# Patient Record
Sex: Male | Born: 1960 | Race: White | Hispanic: No | Marital: Married | Smoking: Current every day smoker
Health system: Southern US, Community
[De-identification: ages and names within clinical notes are randomized; demographics above are authoritative.]

## PROBLEM LIST (undated history)

## (undated) DIAGNOSIS — M542 Cervicalgia: Secondary | ICD-10-CM

## (undated) DIAGNOSIS — J189 Pneumonia, unspecified organism: Secondary | ICD-10-CM

## (undated) DIAGNOSIS — R519 Headache, unspecified: Secondary | ICD-10-CM

## (undated) DIAGNOSIS — I10 Essential (primary) hypertension: Secondary | ICD-10-CM

## (undated) DIAGNOSIS — M1990 Unspecified osteoarthritis, unspecified site: Secondary | ICD-10-CM

## (undated) DIAGNOSIS — M9111 Juvenile osteochondrosis of head of femur [Legg-Calve-Perthes], right leg: Secondary | ICD-10-CM

## (undated) DIAGNOSIS — F32A Depression, unspecified: Secondary | ICD-10-CM

## (undated) DIAGNOSIS — W19XXXA Unspecified fall, initial encounter: Secondary | ICD-10-CM

## (undated) DIAGNOSIS — F419 Anxiety disorder, unspecified: Secondary | ICD-10-CM

## (undated) DIAGNOSIS — M549 Dorsalgia, unspecified: Secondary | ICD-10-CM

## (undated) DIAGNOSIS — F329 Major depressive disorder, single episode, unspecified: Secondary | ICD-10-CM

## (undated) DIAGNOSIS — F410 Panic disorder [episodic paroxysmal anxiety] without agoraphobia: Secondary | ICD-10-CM

## (undated) HISTORY — PX: HERNIA REPAIR: SHX51

## (undated) HISTORY — PX: JOINT REPLACEMENT: SHX530

## (undated) HISTORY — PX: ORCHIECTOMY: SHX2116

---

## 1998-03-31 ENCOUNTER — Inpatient Hospital Stay (HOSPITAL_COMMUNITY): Admission: AD | Admit: 1998-03-31 | Discharge: 1998-04-06 | Payer: Self-pay

## 1998-04-10 ENCOUNTER — Encounter (HOSPITAL_COMMUNITY): Admission: RE | Admit: 1998-04-10 | Discharge: 1998-07-09 | Payer: Self-pay

## 1998-04-14 ENCOUNTER — Inpatient Hospital Stay (HOSPITAL_COMMUNITY): Admission: AD | Admit: 1998-04-14 | Discharge: 1998-04-19 | Payer: Self-pay | Admitting: *Deleted

## 1998-04-20 ENCOUNTER — Encounter (HOSPITAL_COMMUNITY): Admission: RE | Admit: 1998-04-20 | Discharge: 1998-07-19 | Payer: Self-pay

## 1998-12-16 HISTORY — PX: ORCHIECTOMY: SHX2116

## 1998-12-16 HISTORY — PX: INGUINAL HERNIA REPAIR: SUR1180

## 1999-01-31 ENCOUNTER — Encounter: Admission: RE | Admit: 1999-01-31 | Discharge: 1999-04-26 | Payer: Self-pay | Admitting: Anesthesiology

## 1999-04-26 ENCOUNTER — Encounter: Admission: RE | Admit: 1999-04-26 | Discharge: 1999-07-05 | Payer: Self-pay | Admitting: Anesthesiology

## 1999-04-27 ENCOUNTER — Ambulatory Visit (HOSPITAL_COMMUNITY): Admission: RE | Admit: 1999-04-27 | Discharge: 1999-04-27 | Payer: Self-pay | Admitting: *Deleted

## 1999-07-05 ENCOUNTER — Encounter: Admission: RE | Admit: 1999-07-05 | Discharge: 1999-10-03 | Payer: Self-pay | Admitting: Anesthesiology

## 2001-02-17 ENCOUNTER — Ambulatory Visit (HOSPITAL_BASED_OUTPATIENT_CLINIC_OR_DEPARTMENT_OTHER): Admission: RE | Admit: 2001-02-17 | Discharge: 2001-02-17 | Payer: Self-pay | Admitting: Orthopedic Surgery

## 2001-06-15 ENCOUNTER — Emergency Department (HOSPITAL_COMMUNITY): Admission: EM | Admit: 2001-06-15 | Discharge: 2001-06-15 | Payer: Self-pay | Admitting: Emergency Medicine

## 2001-12-02 ENCOUNTER — Emergency Department (HOSPITAL_COMMUNITY): Admission: EM | Admit: 2001-12-02 | Discharge: 2001-12-02 | Payer: Self-pay | Admitting: *Deleted

## 2003-01-25 ENCOUNTER — Encounter: Admission: RE | Admit: 2003-01-25 | Discharge: 2003-01-25 | Payer: Self-pay | Admitting: *Deleted

## 2003-05-06 ENCOUNTER — Encounter: Admission: RE | Admit: 2003-05-06 | Discharge: 2003-05-06 | Payer: Self-pay | Admitting: Occupational Medicine

## 2003-05-06 ENCOUNTER — Encounter: Payer: Self-pay | Admitting: Occupational Medicine

## 2003-08-03 ENCOUNTER — Ambulatory Visit (HOSPITAL_BASED_OUTPATIENT_CLINIC_OR_DEPARTMENT_OTHER): Admission: RE | Admit: 2003-08-03 | Discharge: 2003-08-03 | Payer: Self-pay | Admitting: Orthopedic Surgery

## 2004-01-31 ENCOUNTER — Encounter
Admission: RE | Admit: 2004-01-31 | Discharge: 2004-04-30 | Payer: Self-pay | Admitting: Physical Medicine & Rehabilitation

## 2005-09-10 ENCOUNTER — Emergency Department (HOSPITAL_COMMUNITY): Admission: EM | Admit: 2005-09-10 | Discharge: 2005-09-10 | Payer: Self-pay | Admitting: Emergency Medicine

## 2006-05-09 ENCOUNTER — Emergency Department (HOSPITAL_COMMUNITY): Admission: EM | Admit: 2006-05-09 | Discharge: 2006-05-09 | Payer: Self-pay | Admitting: Emergency Medicine

## 2006-05-17 ENCOUNTER — Emergency Department (HOSPITAL_COMMUNITY): Admission: EM | Admit: 2006-05-17 | Discharge: 2006-05-17 | Payer: Self-pay | Admitting: Emergency Medicine

## 2006-07-31 ENCOUNTER — Emergency Department (HOSPITAL_COMMUNITY): Admission: EM | Admit: 2006-07-31 | Discharge: 2006-07-31 | Payer: Self-pay | Admitting: Emergency Medicine

## 2006-11-24 ENCOUNTER — Encounter: Admission: RE | Admit: 2006-11-24 | Discharge: 2006-11-24 | Payer: Self-pay | Admitting: Orthopaedic Surgery

## 2006-12-01 ENCOUNTER — Encounter: Admission: RE | Admit: 2006-12-01 | Discharge: 2006-12-01 | Payer: Self-pay | Admitting: Orthopaedic Surgery

## 2007-07-17 ENCOUNTER — Inpatient Hospital Stay (HOSPITAL_COMMUNITY): Admission: AD | Admit: 2007-07-17 | Discharge: 2007-07-21 | Payer: Self-pay | Admitting: Psychiatry

## 2007-07-17 ENCOUNTER — Ambulatory Visit: Payer: Self-pay | Admitting: Psychiatry

## 2008-10-05 ENCOUNTER — Emergency Department (HOSPITAL_COMMUNITY): Admission: EM | Admit: 2008-10-05 | Discharge: 2008-10-05 | Payer: Self-pay | Admitting: Family Medicine

## 2008-11-24 ENCOUNTER — Emergency Department (HOSPITAL_COMMUNITY): Admission: EM | Admit: 2008-11-24 | Discharge: 2008-11-24 | Payer: Self-pay | Admitting: Emergency Medicine

## 2009-07-07 ENCOUNTER — Emergency Department (HOSPITAL_COMMUNITY): Admission: EM | Admit: 2009-07-07 | Discharge: 2009-07-07 | Payer: Self-pay | Admitting: Emergency Medicine

## 2009-07-14 ENCOUNTER — Emergency Department (HOSPITAL_COMMUNITY): Admission: EM | Admit: 2009-07-14 | Discharge: 2009-07-14 | Payer: Self-pay | Admitting: Emergency Medicine

## 2010-07-23 ENCOUNTER — Encounter: Admission: RE | Admit: 2010-07-23 | Discharge: 2010-07-23 | Payer: Self-pay | Admitting: Internal Medicine

## 2011-01-06 ENCOUNTER — Encounter: Payer: Self-pay | Admitting: Orthopaedic Surgery

## 2011-03-24 LAB — COMPREHENSIVE METABOLIC PANEL
ALT: 15 U/L (ref 0–53)
Albumin: 3.8 g/dL (ref 3.5–5.2)
Alkaline Phosphatase: 51 U/L (ref 39–117)
BUN: 8 mg/dL (ref 6–23)
Chloride: 105 mEq/L (ref 96–112)
GFR calc Af Amer: >60 mL/min (ref >60)
Total Protein: 6.4 g/dL (ref 6.0–8.3)

## 2011-03-24 LAB — CBC
HCT: 39.2 % (ref 39.0–52.0)
Platelets: 295 10*3/uL (ref 150–400)
RBC: 4.23 MIL/uL (ref 4.22–5.81)

## 2011-05-03 NOTE — Assessment & Plan Note (Signed)
MEDICAL RECORD NUMBER:  [id number].   Joseph Curtis is back regarding his neck and shoulder pain. He had left shoulder  surgery performed with repair of his rotator cuff and some other excisions.  I do not have details of the surgery. We increased his MS Contin to 30 mg  q.12h. and really since the surgery he has had severe pain regardless of  this. He is also complaining of pain in the mid right back area. He is  tolerating the MS Contin fairly well. He is using Skelaxin 800 mg q.6h.  p.r.n. for breakthrough symptoms with some relief. Sleep is a problem at  times although pain is usually worse with activity. Pain is rated as a 8/10  on average.   REVIEW OF SYSTEMS:  The patient denies any chest pain, shortness of breath,  cold or flu symptoms. He denies weakness, numbness, spasms, confusion. He  has had some anxiety and depression. He is currently on Wellbutrin 200 mg  q.d. He has had periodic headaches and nausea as well as problems with  appetite, weight loss, and swelling.   PHYSICAL EXAMINATION:  Blood pressure 161/103, pulse 76. He is saturating  92% on room air. He has some elevation of the right shoulder noted at rest  today and to a certain extent. Certainly, there is hyperactivity of the  lower latissimus dorsi muscle around the T7-T8 level. He had focal bands of  tissue that reproduced pain with palpation today. Pain was worsened also  with shoulder extension and flexion as well as rotation of the spine and  flexion of the spine to either side. Right and left shoulder revealed  surgical site which is clean and intact with one remaining suture. The  patient has fair affect although there was a bit of distress in general  during the examination today.   ASSESSMENT:  1. Status post left rotator cuff tear, 726.11.  2. Cervicalgia related to #1, 723.1.  3. Degenerative right knee disease, 713.96.  4. Myofascial pain in the right latissimus dorsi muscle, 726.9.   PLAN:  1. After  informed consent, we injected the right latissimus dorsi in two     areas today with 2 cc of lidocaine. The patient tolerated this well and     had some relief.  2. I would like physical therapy to work on posture and myofascial     treatments to the right side as able in the context of his left shoulder     rehab.  3. Will continue with his MS Contin at 30 mg q.12h. as well as the Vicodin     5/500 for breakthrough pain. Will use his Skelaxin 800 mg q.6h. for     spasms.  4. Will see the patient back in about a month's time.      Joseph Curtis, M.D.   ZTS/MedQ  D:  04/25/2004 12:54:51  T:  04/25/2004 15:20:36  Job #:  [id number]

## 2011-05-03 NOTE — Assessment & Plan Note (Signed)
HISTORY:  The patient is here regarding his multiple areas of pain,  including his left shoulder and neck.  We had attempted to get some records  of his recent CT and MRI of his cervical spine, but the patient was unable  to obtain these for [place].  We initiated some Topamax for possible neuropathic  pain, and he was unable to tolerate this, secondary to a rash.  His Skelaxin  has been helpful to some extent.  He was using Lortab periodically for break-  through pain.  He has not received any surgical opinion regarding his left  shoulder at this point.  Right now he is using Naproxen daily as well as  Tylenol for break-through severe pain.  The patient rates his pain overall  at a 6-8/10.  It is worse with any type of work or activity.  The pain is  generally worse also at night time.  He denies any frank numbness in the  left arm, but does have some burning-type sensations.   REVIEW OF SYSTEMS:  The patient denies any chest pain, shortness of breath  or problems with his heart.  He has had occasional weakness and spasms in  the left shoulder.  No numbness, seizures, or confusion.  He has had some  problems with sleep and mood.  He has had periodic headaches as related to  the neck portion of his discomfort.  The patient denies nausea, vomiting,  reflux, or constipation.  He has had some weight loss.   PHYSICAL EXAMINATION:  GENERAL:  The patient is generally pleasant, and in  no acute distress.  VITAL SIGNS:  Blood pressure 154/111, pulse 90, respirations 16.  NEUROLOGIC:  He walks with a slight limp to the left side, but generally is  alert and oriented and with a normal appearance.  He had minimal change from  his prior examination, with the strength generally at 5/5 on the right and  3+ to 5/5 on the left.  Reflexes 2+ and sensory examination was grossly  intact.  Spurling's test was negative.  He had positive impingement signs  and apprehensive signs in the left shoulder today.  Motor  in the lower  extremities was 5/5 with intact reflexes and sensation.  No focal muscle  spasm is noted in the low back or the cervical regions today.  He had pain  along the occipital ridge and cervical/suboccipital musculature.   ASSESSMENT:  1. Left partial rotator cuff tear.  2. Cervicalgia on the right.  This may be due to occipital neuralgia, versus     myofascial pain and degenerative joint disease.  3. Degenerative right knee disease.   PLAN:  1. We still need to obtain the patient's recent CT and MRI, which he plans     to provide for [place] shortly.  2. Secondary to his ongoing significant pain, we will start him on a     fentanyl patch 25 mcg q.72h.  Also will provide him with Ultram 50 mg     q.6h. p.r.n. break-through pain.  He may continue with the Skelaxin     p.r.n. spasms at 800 mg.  3. The patient has stopped his Lortab.  4. Await surgical opinion regarding his left shoulder.  5. Consider future treatments, pending his orthopedic outcome and     radiographical findings.  6. I will see the patient back in approximately one month's time.      Ranelle Oyster, M.D.   ZTS/MedQ  D:  02/20/2004  14:55:47  T:  02/20/2004 16:35:45  Job #:  [id number]   cc:   Reuben Likes, M.D.  [address]  [place]  [place] [address]  Fax: [phone]
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
## 2011-05-03 NOTE — Op Note (Signed)
NAME:  Joseph Curtis, Joseph Curtis                         ACCOUNT NO.:  [id number]   MEDICAL RECORD NO.:  [id number]                   PATIENT TYPE:  AMB   LOCATION:  DSC                                  FACILITY:  [facility]   PHYSICIAN:  Harvie Junior, M.D.                DATE OF BIRTH:  June 30, 1961   DATE OF PROCEDURE:  08/03/2003  DATE OF DISCHARGE:                                 OPERATIVE REPORT   PREOPERATIVE DIAGNOSES:  1. Impingement.  2. Acromioclavicular joint arthritis.  3. Concern over partial thickness rotator cuff tear.   POSTOPERATIVE DIAGNOSES:  1. Impingement.  2. Acromioclavicular joint arthritis.  3. Concern over partial thickness rotator cuff tear.  4. Posterior labral tear.   PROCEDURES:  1. Anterolateral acromioplasty.  2. Distal clavicle resection, arthroscopic from an anterior portal.  3. Debridement of a posterior labral tear from an anterior portal within the     glenohumeral joint.   SURGEON:  Harvie Junior, M.D.   ASSISTANT:  Currie Paris. Orma Flaming, P.A.-C   ANESTHESIA:  General.   BRIEF HISTORY:  This is a 50 year old male with a long history of having had  significant problems with pain in his left shoulder.  He ultimately was  evaluated with a MRI which showed that he had some impingement with partial  thickness rotator cuff tearing.  After prolonged conservative care and  failure of the patient to improve completely, the patient was ultimately  taken to the operating room for subacromial decompression, distal clavicle  resection, and evaluation as needed.   DESCRIPTION OF PROCEDURE:  The patient was brought to the operating room  and, after adequate anesthesia was obtained with general anesthetic, the  patient was placed on the operating table.  The left arm was prepped and  draped in the usual sterile fashion.   Following this, routine arthroscopic examination of the shoulder revealed  there was an obvious rotator cuff tear on the undersurface.  This  was  debrided from the glenohumeral joint.  The posterior labrum was identified  and noted also to be torn.  The biceps tendon was anchored, although loose,  could not be pulled down into the joint.  The glenohumeral joint was without  evidence of abnormality.   At this point, the cannula was removed from the glenohumeral joint and  attention was turned up into the subacromial space.  An anterolateral  acromioplasty was performed.  Distal clavicle resection over 17 mm and the  rotator cuff was evaluated superiorly, especially in the area of concern of  partial thickness tearing undersurface.  The rotator cuff had no full  thickness tearing from the superior surface.  A radical bursectomy was  performed at this point.   The shoulder was copiously irrigated and suctioned dry.  The portals were  closed with a bandage.  A sterile compression dressing was applied.   The patient was taken to recovery  and was noted to be in satisfactory  condition.   ESTIMATED BLOOD LOSS:  None.                                               Harvie Junior, M.D.    Ranae Plumber  D:  08/03/2003  T:  08/04/2003  Job:  [id number]

## 2011-05-03 NOTE — Group Therapy Note (Signed)
REASON FOR EVALUATION:  Mr. Lott is here with complaints of left shoulder,  right neck and right knee pain.   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  He had multiple  surgeries and eventually had a patellectomy.  He has pain with walking for  prolonged periods of time and with any type of activity in the right knee.  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  He has seen Dr. Harvie Junior for management of this.  He tells me  he has had 7 injections in the shoulder and also to the left [facility] joint.  He  has also been diagnosed, by his account, with occipital neuralgia and has  seen Dr. Marlan Palau in the past for this, as well as a prior pain  center.  He tells me he has had occipital nerve blocks in the past.  These  helped to a certain extent.  He has been on Naprosyn for 4 years for the  headaches and this pain and states that this generally helps but he does not  want to be on this long-term.  He is using Lortab 10s, two to three a day,  depending on his level of pain, using Cymbalta 60 mg daily for mood as well  as Depakote 500 mg t.i.d. for mood stabilization and Xanax p.r.n., 1 mg  daily to b.i.d.   The patient rates his pain on an average of 5/10, but will range from 4-  8/10, depending on his level of activity.  He denies any back pain, weakness  or numbness in the extremities.  He has had multiple tests done in the past  but does not recall when he has had a recent CT or MRI of his neck.  He has  had a recent shoulder scan with contrast.  He is seeking a second opinion  regarding his left shoulder.   The patient reports that the pain improves with heat and medications,  worsens with any type  of work or activity.   PAST MEDICAL HISTORY:  1. The patient reports a history of arthritis.  2. Apparently anxiety disorder as well with depression.  3. Multiple knee operations of the right knee.  4. Triple inguinal hernias and surgery to correct this and eventual     orchiectomy on the left.  5. He had a left shoulder arthroscopy by Dr. Luiz Blare.  6. The patient had a history of undescended testicles.  7. History of Legg-Perthes disease.  8. History of post-traumatic stress disorder.     Dr. Cleone Slim is his psychiatrist.   CURRENT MEDICATIONS:  Current medications are listed above in the HPI.   ALLERGIES:  Allergies are to PENICILLIN and CODEINE.   SOCIAL HISTORY:  The patient is married with children.  He smokes a half a  pack per day.  He has not worked since May 06, 2003.   FAMILY HISTORY:  Family history is noncontributory.   REVIEW OF SYSTEMS:  The patient denies chest pain, shortness of breath,  wheezing, coughing and cold symptoms.  He has occasional weakness and pain  in the left arm with use.  He also reports anxiety,  depression and sleep of  4 to 6 hours a night.  He also has frequent headaches that are worsened with  activity and can be worse at night.  He has some vomiting when his pain is  bad.  He reports at 15-pound weight loss over the last 6 to 8 months.  He  denies any other symptomatology at this point.   PHYSICAL EXAMINATION:  GENERAL:  On physical examination today, the patient  is pleasant, in no acute distress.  VITAL SIGNS:  Blood pressure is 150/109.  Pulse is 91, respiratory rate 16.  NEUROMUSCULAR:  He walks with a normal gait.  He is generally alert and  normal appearance.  His neck was painful to palpation along the cervical  paraspinals and the capitis splenius and spinalis muscles.  He had pain  along the occipital ridge as well.  Pain was worse with palpation.  It did  not necessarily appear worse with particular movements, although twisting to   the left, it seemed to increase the pain a bit.  There was no focal muscle  spasm noted today.  Range of motion was generally fair in the neck  The  patient has normal passive range of motion of both shoulders but had  weakness with abduction on the left side.  He had pain with impingement  maneuvers, had pain over the Lifescape joint as well.  Bicipital tendons were  nontender.  Strength in the right arm was 5/5; the left arm was 3/5  proximally to 5/5 distally.  Reflexes were 2+.  Sensory exam was intact.  The patient had a negative Spurling's test.  Low back exam was normal with  normal range of motion and appearance today.  Right knee had postoperative  scarring with absent patella.  The patient had some surprisingly good  strength with knee extension and flexion at 5/5 and there was minimal pain  with palpation today.  It appears that he had some grooves made in his  distal femur to help track the quadriceps tendon.  Motor exam generally  throughout the lower extremities was 5/5 with normal sensory function.  He  had normal range of motion and reflexes were 2+.  The patient cognitively  was appropriate today.   ASSESSMENT:  1. Left partial rotator cuff tear.  2. Cervicalgia on the right.  This is consistent with occipital neuralgia     but could not rule out myofascial component and osteoarthritic component.  3. Degenerative right knee disease.   PLAN:  1. I would like to get more of the patient's records, as I have very limited     information at this point.  I would like to see a recent CT or MRI of his     cervical spine.  2. For proposed neuropathic pain, we will begin Topamax 25 mg daily,     titrating up to 75 mg daily over the next 2 weeks' time.  3. For any muscle spasm component, we will try to schedule and then     eventually p.r.n. Skelaxin at 4 times daily, 800 mg.  4. The patient can continue with his Lortab for now. 5. The patient needs surgical intervention apparently at  the left shoulder,     as he obviously has some weakness and discomfort, and I am not sure what     we can add from a therapy standpoint until his pain is less.  6. Will consider long-acting opioid at some point, depending on progress  with the current regimen.  7. Consider future nerve blocks in the neck, depending on progress.     Ranelle Oyster, M.D.   ZTS/MedQ  D:  02/01/2004 09:58:43  T:  02/01/2004 12:18:08  Job #:  [id number]   cc:   Reuben Likes, M.D.  [address]  [place]  [place] [address]  Fax: [phone]

## 2011-05-03 NOTE — Assessment & Plan Note (Signed)
Joseph Curtis is back regarding his left neck and shoulder pain. He has what turns  out to be a left full thickness tear of the supraspinatus tendon apparently.  He has seen surgery for a second opinion and apparently will have surgery to  repair the tendon in the coming weeks. We started him on Duragesic for pain  control, and he was unable to tolerate this secondary to nausea and  vomiting. We switched him to MS Contin 15 mg q.12h., and he found that this  was not overly helpful. Today, he rates his pain at 7/10. The patient starts  in the shoulder and radiates into the neck and left arm. Pain is worse with  any type of lifting and activity.   REVIEW OF SYSTEMS:  The patient denies any chest pain, shortness of breath,  wheezing, coughing, cold symptoms. He denies any weakness, numbness,  dizziness, spasms, strokes, or confusion. He has had no anxiety, depression,  sleep, or mood changes. Nausea and vomiting are listed above. Denies  diarrhea or constipation.   PHYSICAL EXAMINATION:  On physical exam today, blood pressure 143/88, pulse  83, saturating 96% on room air. The patient walks with normal gait. Affect  is appropriate. Continues to have significant weakness in the left shoulder.  There was some moderate spasm of the left trapezium muscle and pain with  rightward lateral bending and rotation. Neck was moderately tender. Reflexes  were 2+. Left shoulder was positive for pain with impingement and  apprehension signs. The patient had minimal initial abduction of the  shoulder.   ASSESSMENT/PLAN:  1. Left rotator cuff tear.  2. Cervicalgia probably related to #1.  3. Degenerative right knee disease.   PLAN:  1. The patient will follow up with surgery for rotator cuff repair.  2. For now, we will increase his MS Contin at 30 mg q.12h. I gave him     Vicodin 5/500 for breakthrough pain #45, use on a q.12h. p.r.n. basis.  3. I will see the patient back in approximately five weeks'  time.      Ranelle Oyster, M.D.   ZTS/MedQ  D:  03/21/2004 10:55:08  T:  03/21/2004 11:47:21  Job #:  [id number]

## 2011-09-11 ENCOUNTER — Other Ambulatory Visit: Payer: Self-pay | Admitting: Family Medicine

## 2011-09-11 DIAGNOSIS — M25551 Pain in right hip: Secondary | ICD-10-CM

## 2011-09-14 ENCOUNTER — Other Ambulatory Visit: Payer: Self-pay

## 2011-09-17 ENCOUNTER — Ambulatory Visit
Admission: RE | Admit: 2011-09-17 | Discharge: 2011-09-17 | Disposition: A | Discharge status: Home / Self Care | Payer: Self-pay | Source: Ambulatory Visit | Attending: Family Medicine | Admitting: Family Medicine

## 2011-09-17 DIAGNOSIS — M25552 Pain in left hip: Secondary | ICD-10-CM

## 2011-09-30 LAB — COMPREHENSIVE METABOLIC PANEL
Alkaline Phosphatase: 55
BUN: 28 — ABNORMAL HIGH
Calcium: 9.6
Chloride: 103
Creatinine, Ser: 0.85
GFR calc non Af Amer: >60
Glucose, Bld: 91
Potassium: 4.3
Sodium: 139
Total Bilirubin: 0.6
Total Protein: 7.1

## 2011-09-30 LAB — DRUGS OF ABUSE SCREEN W/O ALC, ROUTINE URINE
Amphetamine Screen, Ur: NEGATIVE
Benzodiazepines.: POSITIVE — AB
Creatinine,U: 172.5
Marijuana Metabolite: NEGATIVE

## 2011-09-30 LAB — CBC
HCT: 44.3
Platelets: 277

## 2011-09-30 LAB — TSH: TSH: 1.189

## 2011-09-30 LAB — BENZODIAZEPINE, QUANTITATIVE, URINE: Alprazolam (GC/LC/MS), ur confirm: 250 ng/mL

## 2011-09-30 LAB — VALPROIC ACID LEVEL: Valproic Acid Lvl: 61.4

## 2013-12-16 ENCOUNTER — Emergency Department (HOSPITAL_COMMUNITY): Payer: Medicare Other

## 2013-12-16 ENCOUNTER — Emergency Department (HOSPITAL_COMMUNITY)
Admission: EM | Admit: 2013-12-16 | Discharge: 2013-12-16 | Disposition: A | Discharge status: Home / Self Care | Payer: Medicare Other | Attending: Emergency Medicine | Admitting: Emergency Medicine

## 2013-12-16 ENCOUNTER — Encounter (HOSPITAL_COMMUNITY): Payer: Self-pay | Admitting: Emergency Medicine

## 2013-12-16 DIAGNOSIS — S0990XA Unspecified injury of head, initial encounter: Secondary | ICD-10-CM | POA: Insufficient documentation

## 2013-12-16 DIAGNOSIS — Z79899 Other long term (current) drug therapy: Secondary | ICD-10-CM | POA: Insufficient documentation

## 2013-12-16 DIAGNOSIS — S199XXA Unspecified injury of neck, initial encounter: Secondary | ICD-10-CM

## 2013-12-16 DIAGNOSIS — F17200 Nicotine dependence, unspecified, uncomplicated: Secondary | ICD-10-CM | POA: Insufficient documentation

## 2013-12-16 DIAGNOSIS — S0180XA Unspecified open wound of other part of head, initial encounter: Secondary | ICD-10-CM | POA: Insufficient documentation

## 2013-12-16 DIAGNOSIS — M545 Low back pain, unspecified: Secondary | ICD-10-CM | POA: Insufficient documentation

## 2013-12-16 DIAGNOSIS — Y929 Unspecified place or not applicable: Secondary | ICD-10-CM | POA: Insufficient documentation

## 2013-12-16 DIAGNOSIS — S0993XA Unspecified injury of face, initial encounter: Secondary | ICD-10-CM | POA: Insufficient documentation

## 2013-12-16 DIAGNOSIS — W1809XA Striking against other object with subsequent fall, initial encounter: Secondary | ICD-10-CM | POA: Insufficient documentation

## 2013-12-16 DIAGNOSIS — F3289 Other specified depressive episodes: Secondary | ICD-10-CM | POA: Insufficient documentation

## 2013-12-16 DIAGNOSIS — S0181XA Laceration without foreign body of other part of head, initial encounter: Secondary | ICD-10-CM

## 2013-12-16 DIAGNOSIS — I10 Essential (primary) hypertension: Secondary | ICD-10-CM | POA: Insufficient documentation

## 2013-12-16 DIAGNOSIS — G8929 Other chronic pain: Secondary | ICD-10-CM | POA: Insufficient documentation

## 2013-12-16 DIAGNOSIS — Y939 Activity, unspecified: Secondary | ICD-10-CM | POA: Insufficient documentation

## 2013-12-16 DIAGNOSIS — F410 Panic disorder [episodic paroxysmal anxiety] without agoraphobia: Secondary | ICD-10-CM | POA: Insufficient documentation

## 2013-12-16 DIAGNOSIS — R42 Dizziness and giddiness: Secondary | ICD-10-CM | POA: Insufficient documentation

## 2013-12-16 DIAGNOSIS — F329 Major depressive disorder, single episode, unspecified: Secondary | ICD-10-CM | POA: Insufficient documentation

## 2013-12-16 DIAGNOSIS — Z880 Allergy status to penicillin: Secondary | ICD-10-CM | POA: Insufficient documentation

## 2013-12-16 HISTORY — DX: Dorsalgia, unspecified: M54.9

## 2013-12-16 HISTORY — DX: Panic disorder (episodic paroxysmal anxiety): F41.0

## 2013-12-16 HISTORY — DX: Major depressive disorder, single episode, unspecified: F32.9

## 2013-12-16 HISTORY — DX: Anxiety disorder, unspecified: F41.9

## 2013-12-16 HISTORY — DX: Cervicalgia: M54.2

## 2013-12-16 HISTORY — DX: Unspecified fall, initial encounter: W19.XXXA

## 2013-12-16 HISTORY — DX: Essential (primary) hypertension: I10

## 2013-12-16 HISTORY — DX: Depression, unspecified: F32.A

## 2013-12-16 MED ORDER — OXYCODONE-ACETAMINOPHEN 5-325 MG PO TABS
1.0000 | ORAL_TABLET | Freq: Once | ORAL | Status: AC
  Administered 2013-12-16: 1 via ORAL
  Filled 2013-12-16: qty 1

## 2013-12-16 NOTE — ED Provider Notes (Signed)
CSN: [id number]     Arrival date & time 12/16/13  0744 History   First MD Initiated Contact with Patient 12/16/13 [phone]     Chief Complaint  Patient presents with  . Fall  . Facial Laceration   (Consider location/radiation/quality/duration/timing/severity/associated sxs/prior Treatment) HPI Comments: Patient with history of chronic pain presents with complaint of head injury, neck pain, facial laceration. Patient states that he stood up quickly from a recliner this morning while having a panic attack. Patient states that he blacked out and fell striking his head on a coffee table. He was unconscious for an unknown amount of time. He sustained a laceration above his right eyebrow. Patient states that he will often feel lightheaded and passed out during panic attacks. EMS was called and patient was placed in a c-collar. No other treatments prior to arrival. Patient complains of neck pain. He also has his usual chronic lower back pain. No weakness, numbness, or tingling in his arms or his legs. No nausea or vomiting. No vision changes. No chest pain or shortness of breath. Onset of symptoms acute. Course is constant. Nothing makes symptoms better or worse.  The history is provided by the patient.    Past Medical History  Diagnosis Date  . Anxiety   . Depression   . Panic attacks   . Hypertension   . 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 Neck pain     chronic  . Back pain     chronic   History reviewed. No pertinent past surgical history. No family history on file. History  Substance Use Topics  . Smoking status: Current Every Day Smoker  . Smokeless tobacco: Never Used  . Alcohol Use: No    Review of Systems  Constitutional: Negative for fatigue.  HENT: Negative for tinnitus.   Eyes: Negative for photophobia, pain and visual disturbance.  Respiratory: Negative for shortness of breath.   Cardiovascular: Negative for chest pain, palpitations and leg swelling.  Gastrointestinal:  Negative for nausea and vomiting.  Musculoskeletal: Positive for back pain and neck pain. Negative for gait problem.  Skin: Positive for wound.  Neurological: Positive for light-headedness. Negative for dizziness, weakness, numbness and headaches.  Psychiatric/Behavioral: Negative for confusion and decreased concentration.    Allergies  Bee venom; Codeine; and Penicillins  Home Medications   Current Outpatient Rx  Name  Route  Sig  Dispense  Refill  . ALPRAZolam (XANAX) 1 MG tablet   Oral   Take 1 mg by mouth 3 (three) times daily as needed for anxiety.         . bisoprolol-hydrochlorothiazide (ZIAC) 2.5-6.25 MG per tablet   Oral   Take 1 tablet by mouth every morning.         . escitalopram (LEXAPRO) 20 MG tablet   Oral   Take 20 mg by mouth every morning.         Marland Kitchen HYDROcodone-acetaminophen (NORCO) 10-325 MG per tablet   Oral   Take 1 tablet by mouth 3 (three) times daily as needed for moderate pain.         Marland Kitchen ibuprofen (ADVIL,MOTRIN) 200 MG tablet   Oral   Take 800 mg by mouth every 6 (six) hours as needed.         Marland Kitchen losartan (COZAAR) 50 MG tablet   Oral   Take 50 mg by mouth every morning.         . Melatonin 10 MG CAPS   Oral  Take 10 mg by mouth at bedtime.         Marland Kitchen OLANZapine (ZYPREXA) 5 MG tablet   Oral   Take 5 mg by mouth every morning.         . propranolol (INDERAL) 40 MG tablet   Oral   Take 40 mg by mouth 2 (two) times daily.         . rizatriptan (MAXALT) 10 MG tablet   Oral   Take 10 mg by mouth daily as needed for migraine. May repeat in 2 hours if needed          BP 146/97  Pulse 65  Temp(Src) 97.7 F (36.5 C) (Oral)  Resp 20  Ht 5\' 6"  (1.676 m)  Wt 168 lb (76.204 kg)  BMI 27.13 kg/m2  SpO2 99% Physical Exam  Nursing note and vitals reviewed. Constitutional: He is oriented to person, place, and time. He appears well-developed and well-nourished.  HENT:  Head: Normocephalic and atraumatic. Head is without  raccoon's eyes and without Battle's sign.  Right Ear: Tympanic membrane, external ear and ear canal normal. No hemotympanum.  Left Ear: Tympanic membrane, external ear and ear canal normal. No hemotympanum.  Nose: Nose normal. No nasal septal hematoma.  Mouth/Throat: Oropharynx is clear and moist.  3cm, linear, clean, hemostatic laceration full thickness of skin superior to R eyebrow, not involving eyebrow. No FB seen. Muscle visualized but not lacerated. Full ROM of forehead.   Eyes: Conjunctivae, EOM and lids are normal. Pupils are equal, round, and reactive to light.  No visible hyphema. Pupils dilated, reactive.  Neck: Normal range of motion. Neck supple.  Cardiovascular: Normal rate and regular rhythm.   Pulmonary/Chest: Effort normal and breath sounds normal.  Abdominal: Soft. There is no tenderness.  Musculoskeletal: Normal range of motion.       Cervical back: He exhibits normal range of motion, no tenderness and no bony tenderness.       Thoracic back: He exhibits no tenderness and no bony tenderness.       Lumbar back: He exhibits no tenderness and no bony tenderness.  Neurological: He is alert and oriented to person, place, and time. He has normal strength and normal reflexes. No cranial nerve deficit or sensory deficit. Coordination normal. GCS eye subscore is 4. GCS verbal subscore is 5. GCS motor subscore is 6.  Skin: Skin is warm and dry.  Psychiatric: He has a normal mood and affect.    ED Course  Procedures (including critical care time) Labs Review Labs Reviewed - No data to display Imaging Review Ct Head Wo Contrast  12/16/2013   CLINICAL DATA:  Patient experienced ectatic and femoral  EXAM: CT HEAD WITHOUT CONTRAST  CT CERVICAL SPINE WITHOUT CONTRAST  TECHNIQUE: Multidetector CT imaging of the head and cervical spine was performed following the standard protocol without intravenous contrast. Multiplanar CT image reconstructions of the cervical spine were also generated.   COMPARISON:  None.  FINDINGS: CT HEAD FINDINGS  There is no evidence of mass effect, midline shift or extra-axial fluid collections. There is no evidence of a space-occupying lesion or intracranial hemorrhage. There is no evidence of a cortical-based area of acute infarction.  The ventricles and sulci are appropriate for the patient's age. The basal cisterns are patent.  Visualized portions of the orbits are unremarkable. Mild left maxillary sinus mucosal thickening. The mastoid air cells are normal.  The osseous structures are unremarkable. There is a right frontal scalp contusion.  CT CERVICAL  SPINE FINDINGS  The alignment is anatomic. The vertebral body heights are maintained. There is no acute fracture. There is no static listhesis. The prevertebral soft tissues are normal. The intraspinal soft tissues are not fully imaged on this examination due to poor soft tissue contrast, but there is no gross soft tissue abnormality.  There is degenerative disc disease at C4-5 with a broad-based disc osteophyte complex and bilateral facet arthropathy. There is left facet arthropathy at C5-6 and C6-7.  The visualized portions of the lung apices demonstrate no focal abnormality.  IMPRESSION: 1. No acute intracranial pathology. 2. No acute osseous injury cervical spine. 3. Cervical spine spondylosis as described above.   Electronically Signed   By: Elige KoHetal  Patel   On: 12/16/2013 09:01   Ct Cervical Spine Wo Contrast  12/16/2013   CLINICAL DATA:  Patient experienced ectatic and femoral  EXAM: CT HEAD WITHOUT CONTRAST  CT CERVICAL SPINE WITHOUT CONTRAST  TECHNIQUE: Multidetector CT imaging of the head and cervical spine was performed following the standard protocol without intravenous contrast. Multiplanar CT image reconstructions of the cervical spine were also generated.  COMPARISON:  None.  FINDINGS: CT HEAD FINDINGS  There is no evidence of mass effect, midline shift or extra-axial fluid collections. There is no evidence of  a space-occupying lesion or intracranial hemorrhage. There is no evidence of a cortical-based area of acute infarction.  The ventricles and sulci are appropriate for the patient's age. The basal cisterns are patent.  Visualized portions of the orbits are unremarkable. Mild left maxillary sinus mucosal thickening. The mastoid air cells are normal.  The osseous structures are unremarkable. There is a right frontal scalp contusion.  CT CERVICAL SPINE FINDINGS  The alignment is anatomic. The vertebral body heights are maintained. There is no acute fracture. There is no static listhesis. The prevertebral soft tissues are normal. The intraspinal soft tissues are not fully imaged on this examination due to poor soft tissue contrast, but there is no gross soft tissue abnormality.  There is degenerative disc disease at C4-5 with a broad-based disc osteophyte complex and bilateral facet arthropathy. There is left facet arthropathy at C5-6 and C6-7.  The visualized portions of the lung apices demonstrate no focal abnormality.  IMPRESSION: 1. No acute intracranial pathology. 2. No acute osseous injury cervical spine. 3. Cervical spine spondylosis as described above.   Electronically Signed   By: Elige KoHetal  Patel   On: 12/16/2013 09:01    EKG Interpretation    Date/Time:  Thursday December 16 2013 08:25:31 EST Ventricular Rate:  65 PR Interval:  159 QRS Duration: 83 QT Interval:  424 QTC Calculation: 441 R Axis:   73 Text Interpretation:  Sinus rhythm Normal ECG No significant change since last tracing Confirmed by Leiam  MD, CHARLES ([id number]) on 12/16/2013 8:29:47 AM           8:16 AM Patient seen and examined. Work-up initiated. Medications ordered. D/w Dr. Bernette MayersSheldon.   Vital signs reviewed and are as follows: Filed Vitals:   12/16/13 0755  BP: 146/97  Pulse: 65  Temp: 97.7 F (36.5 C)  Resp: 20   10:02 AM Pt informed of CT results. C-collar removed. Patient has normal ROM in neck with minimal pain.    LACERATION REPAIR Performed by: Carolee RotaGEIPLE,Priyansh Pry S Authorized by: Carolee RotaGEIPLE,Dorothye Berni S Consent: Verbal consent obtained. Risks and benefits: risks, benefits and alternatives were discussed Consent given by: patient Patient identity confirmed: provided demographic data Prepped and Draped in normal sterile fashion Wound explored  Laceration Location: R forehead  Laceration Length: 3cm  No Foreign Bodies seen or palpated  Anesthesia: local infiltration  Local anesthetic: lidocaine 2% with epinephrine  Anesthetic total: 4ml  Irrigation method: skin scrub with dermal cleanser Amount of cleaning: standard  Skin closure: 5-0 Ethilon  Number of sutures: 5  Technique: simple interrupted  Patient tolerance: Patient tolerated the procedure well with no immediate complications.  10:03 AM Patient counseled on wound care. Patient counseled on need to return or see PCP/urgent care for suture removal in 5 days. Patient was urged to return to the Emergency Department urgently with worsening pain, swelling, expanding erythema especially if it streaks away from the affected area, fever, or if they have any other concerns. Patient verbalized understanding.   Patient and wife (via telephone) were counseled on head injury precautions and symptoms that should indicate their return to the ED.  These include severe worsening headache, vision changes, confusion, loss of consciousness, trouble walking, nausea & vomiting, or weakness/tingling in extremities.    Pt has chronic pain medications at home.    MDM   1. Head injury, initial encounter   2. Facial laceration, initial encounter    Head injury - CT neg. Normal neuro exam.   Fall - related to panic attack, EKG normal, no CP/palps, no orthostasis. Do not suspect serious cardiogenic etiology.   Laceration - repaired without complication per above    Renne Crigler, PA-C 12/16/13 1006

## 2013-12-16 NOTE — ED Notes (Signed)
[facility] pt was in the process of standing up during a panic attack and fell hitting head on his coffee table. Unknown LOC. Has a laceration just above his right eyebrow. Presents with c-collar in [facility]. Possible shingles to his left leg as well.

## 2013-12-16 NOTE — ED Notes (Signed)
Came into Patient room to do orthostatic vital signs.  Patient is still at CT.

## 2013-12-16 NOTE — Discharge Instructions (Signed)
Please read and follow all provided instructions.  Your diagnoses today include:  1. Head injury, initial encounter   2. Facial laceration, initial encounter     Tests performed today include:  CT scan of your head and neck that did not show any serious injury.  Vital signs. See below for your results today.   Medications prescribed:   None   Take any prescribed medications only as directed.  Home care instructions:  Follow any educational materials contained in this packet.  Do not take any medications containing aspirin for one week as this can interfere with your body's ability to clot.   BE VERY CAREFUL not to take multiple medicines containing Tylenol (also called acetaminophen). Doing so can lead to an overdose which can damage your liver and cause liver failure and possibly death.   Follow-up instructions: Please follow-up with your primary care provider in 5 days for further evaluation of your symptoms.  Return instructions:  SEEK IMMEDIATE MEDICAL ATTENTION IF:  There is confusion or drowsiness (although children frequently become drowsy after injury).   You cannot awaken the injured person.   You have more than one episode of vomiting.   You notice dizziness or unsteadiness which is getting worse, or inability to walk.   You have convulsions or unconsciousness.   You experience severe, persistent headaches not relieved by Tylenol.  You cannot use arms or legs normally.   There are changes in pupil sizes. (This is the black center in the colored part of the eye)   There is clear or bloody discharge from the nose or ears.   You have change in speech, vision, swallowing, or understanding.   Localized weakness, numbness, tingling, or change in bowel or bladder control.  You have any other emergent concerns.   Additional Information: You have had a head injury which does not appear to require admission at this time.  Your vital signs today were: BP  131/82   Pulse 72   Temp(Src) 97.7 F (36.5 C) (Oral)   Resp 20   Ht 5\' 6"  (1.676 m)   Wt 168 lb (76.204 kg)   BMI 27.13 kg/m2   SpO2 99% If your blood pressure (BP) was elevated above 135/85 this visit, please have this repeated by your doctor within one month. --------------  Emergency Department Resource Guide 1) Find a Doctor and Pay Out of Pocket Although you won't have to find out who is covered by your insurance plan, it is a good idea to ask around and get recommendations. You will then need to call the office and see if the doctor you have chosen will accept you as a new patient and what types of options they offer for patients who are self-pay. Some doctors offer discounts or will set up payment plans for their patients who do not have insurance, but you will need to ask so you aren't surprised when you get to your appointment.  2) Contact Your Local Health Department Not all health departments have doctors that can see patients for sick visits, but many do, so it is worth a call to see if yours does. If you don't know where your local health department is, you can check in your phone book. The CDC also has a tool to help you locate your state's health department, and many state websites also have listings of all of their local health departments.  3) Find a Walk-in Clinic If your illness is not likely to be very severe or  complicated, you may want to try a walk in clinic. These are popping up all over the country in pharmacies, drugstores, and shopping centers. They're usually staffed by nurse practitioners or physician assistants that have been trained to treat common illnesses and complaints. They're usually fairly quick and inexpensive. However, if you have serious medical issues or chronic medical problems, these are probably not your best option.  No Primary Care Doctor: - Call Health Connect at  [phone] - they can help you locate a primary care doctor that  accepts your insurance,  provides certain services, etc. - Physician Referral Service- [phone]  Chronic Pain Problems: Organization         Address  Phone   Notes  [facility]  [phone] Patients need to be referred by their primary care doctor.   Medication Assistance: Organization         Address  Phone   Notes  [facility] Medication [facility] [address] [place], [place] [address] [phone] --Must be a resident of [facility] -- Must have NO insurance coverage whatsoever (no Medicaid/ Medicare, etc.) -- The pt. MUST have a primary care doctor that directs their care regularly and follows them in the community   [facility]ist  [phone]   [employer]  [phone]    Agencies that provide inexpensive medical care: Organization         Address  Phone   Notes  [place]  [phone]   [facility]    [phone]   [facility] [place], [place] [address] [phone]   [place] [address] [place] [phone]   Planned Parenthood    [phone]   [facility]    [phone]   [place] and [address], [place] Phone:  [phone], Fax:  [phone] Hours of Operation:  9 am - 6 pm, M-F.  Also accepts Medicaid/Medicare and self-pay.  [facility] for [place], [address], [place] Phone: [phone], Fax: [phone]. Hours of Operation:  8:30 am - 5:30 pm, M-F.  Also accepts Medicaid and self-pay.  [facility] [address] [place] Phone: [phone]   [place], [place], [place] [phone], Ext. [phone] Mondays & Thursdays: 7-9 AM.  First 15 patients are seen on a first come, first serve basis.    [place] Providers:  Organization         Address  Phone    Notes  [facility] [address], [place] [phone] Also accepts self-pay patients.  [facility] [id number] [place], [place]  [phone]   [place], [address], [place] [phone]   [facility] [address] [place] [phone]   Lucianne Lei [address], [place]   [phone] Only accepts [place] Access [place] patients after they have their name applied to their card.   Self-Pay (no insurance) in [facility]:  Organization         Address  Phone   Notes  Sickle Cell Patients, [facility] [place] [phone]   [facility] [address]  [address], [place] [phone]   [facility] [place]  [place], [place],  [phone]   [facility]/Dr. Osei-Bonsu  [address] [place] or [address], [place] [phone] Phone number for both [place] and [place] locations is the same.  [facility] [address], [place] [phone]   [facility] [address] [place] or [address] [phone]   [facility] [address], [place] [phone], phone; [phone], fax Sees patients 1st and 3rd Saturday of every month.  Must not qualify for public or private insurance (i.e. Medicaid, Medicare, [place] Health Choice, Veterans' Benefits)  Household income should be no more than 200% of the poverty level The clinic cannot treat you if you are pregnant or think you are pregnant  Sexually transmitted diseases are not treated at the clinic.    Dental Care: Organization         Address  Phone  Notes  [facility] Department of [facility] [place] [phone] Accepts children up to age 26 who are enrolled in [place] or [place]; pregnant women with a Medicaid card; and children who have applied for Medicaid or [place] Health Choice, but were declined, whose parents can pay a reduced fee at time of service.  [facility] Department of [facility]  [address], [place] [phone] Accepts children up to age 67 who are enrolled in [place] or [place]; pregnant women with a Medicaid card; and children who have applied for Medicaid or [place] Health Choice, but were declined, whose parents can pay a reduced fee at time of service.  [place] Adult Dental Access PROGRAM  [place] [phone] Patients are seen by appointment only. Walk-ins are not accepted. [place] will see patients 36 years of age and older. Monday - Tuesday (8am-5pm) Most Wednesdays (8:30-5pm) $30 per visit, cash only  [facility] Adult Dental Access PROGRAM  [address], [facility] [phone] Patients are seen by appointment only. Walk-ins are not accepted. [place] will see patients 60 years of age and older. One Wednesday Evening (Monthly: Volunteer Based).  $30 per visit, cash only  [place]  [phone] for adults; Children under age 5, call Graduate Pediatric Dentistry at [phone]. Children aged 12-14, please call [phone] to request a pediatric application.  Dental services are provided in all areas of dental care including fillings, crowns and bridges, complete and partial dentures, implants, gum treatment, root canals, and extractions. Preventive care is also provided. Treatment is provided to both adults and children. Patients are selected via a lottery and there is often a waiting list.   [facility] [address], [place]  [phone] [email or web address]   [facility] [address] [place], [place] [phone], Ext.  [phone] Second and Fourth Thursday of each month, opens at 6:30 AM; Clinic ends at 9 AM.  Patients are seen on a first-come first-served basis, and a limited number are seen during each clinic.   [facility]  [address] Hillard Danker [place], [place] [phone]   Eligibility Requirements You must have lived in [place], [place], or [place] counties for at least the last three months.   You cannot be  eligible for state or federal sponsored [employer], including [employer], [place], or [employer].   You generally cannot be eligible for healthcare insurance through your employer.    How to apply: Eligibility screenings are held every Tuesday and Wednesday afternoon from 1:00 pm until 4:00 pm. You do not need an appointment for the interview!  [facility] [address] [place], [place]   [place]  [place] Department  [place]  [phone]    Behavioral Health Resources in the Community: Intensive Outpatient Programs Organization         Address  Phone  Notes  [place]. [address] [place], [place] [phone]   [facility] Outpatient [address] [place], [place]   ADS: Alcohol & Drug S[facility] [address] [place], [place]   [place] [address]  [place], [place] or [phone]   Substance Abuse Resources Organization         Address  Phone  Notes  Alcohol and Drug Services  [phone]   [place]  [phone]   [facility] [place]   Chinita Pester  [phone]   Residential & Outpatient Substance Abuse Program  [phone]   Psychological Services Organization         Address  Phone  Notes  [facility]  [place]  [phone]   [place] [address], [place] or [phone]    Mobile Crisis Teams Organization         Address  Phone  Notes  [facility], Mobile Crisis Care Unit  [phone]   [facility]  [address] [place], [place]   [place] Levels [address], [place] [phone]    Self-Help/Support Groups Organization         Address  Phone             Notes  [place]. of [place] - variety of support groups  [place] Call for more information  Narcotics Anonymous (NA), [facility] [address], [employer] [place]  2 meetings at this location   Special educational needs teacher         Address  Phone  Notes  [facility] [place],    [place]  [phone]   [facility]  [address], [place] [id number], [place], [place]   [place], [place] [phone] Admissions: 8am-3pm M-F  Incentives Substance [place] [address],    [place], [place] [phone]   [facility] [address] [place], [place], [place]   [facility] [address],  [place], [place]   [facility] Programs - Intensive Outpatient [address], [place], [place]   [facility] ([place].) [place].,  [place], [place] [phone] or [phone]   [facility] Services (RTS) [address] [place], [place] Accepts Medicaid  [facility] [place] [address]  [place] [phone] Substance Abuse/Addiction Treatment   [facility] [address] [facility] Organization         Address  Phone  Notes  [facility]  [phone]   Domenic Schwab, PhD [place], [address],  [place]   [phone] or [phone]) [phone]   [place]  [place], [place] [phone]   [place], [place], [place] [phone] Insurance/Medicaid/sponsorship through [facility] and Families [address], [address] [place], [place] [phone] [place], [place] [phone]    Dr. Adele Schilder  [phone]   [facility] Dept. 1) [address] [place] 2) [place] 3)  [place] [address], [place] [phone]  [phone]   [place] [phone] or [phone] (After Hours)

## 2013-12-16 NOTE — ED Provider Notes (Signed)
Medical screening examination/treatment/procedure(s) were performed by non-physician practitioner and as supervising physician I was immediately available for consultation/collaboration.  EKG Interpretation    Date/Time:  Thursday December 16 2013 08:25:31 EST Ventricular Rate:  65 PR Interval:  159 QRS Duration: 83 QT Interval:  424 QTC Calculation: 441 R Axis:   73 Text Interpretation:  Sinus rhythm Normal ECG No significant change since last tracing Confirmed by Maximiano  MD, CHARLES ([id number]) on 12/16/2013 8:29:47 AM              Leonette Mostharles B. Bernette MayersSheldon, MD 12/16/13 1015

## 2014-11-30 ENCOUNTER — Telehealth: Payer: Self-pay

## 2014-11-30 ENCOUNTER — Ambulatory Visit (INDEPENDENT_AMBULATORY_CARE_PROVIDER_SITE_OTHER): Payer: Medicare Other | Admitting: Family Medicine

## 2014-11-30 VITALS — BP 100/60 | HR 90 | Resp 20

## 2014-11-30 DIAGNOSIS — T7840XA Allergy, unspecified, initial encounter: Secondary | ICD-10-CM

## 2014-11-30 DIAGNOSIS — G894 Chronic pain syndrome: Secondary | ICD-10-CM

## 2014-11-30 DIAGNOSIS — M9110 Juvenile osteochondrosis of head of femur [Legg-Calve-Perthes], unspecified leg: Secondary | ICD-10-CM | POA: Insufficient documentation

## 2014-11-30 DIAGNOSIS — N508 Other specified disorders of male genital organs: Secondary | ICD-10-CM

## 2014-11-30 DIAGNOSIS — N5089 Other specified disorders of the male genital organs: Secondary | ICD-10-CM

## 2014-11-30 DIAGNOSIS — F410 Panic disorder [episodic paroxysmal anxiety] without agoraphobia: Secondary | ICD-10-CM | POA: Insufficient documentation

## 2014-11-30 MED ORDER — PREDNISONE 20 MG PO TABS: ORAL_TABLET | ORAL | Status: DC

## 2014-11-30 MED ORDER — DIPHENHYDRAMINE HCL 50 MG/ML IJ SOLN
25.0000 mg | Freq: Once | INTRAMUSCULAR | Status: AC
  Administered 2014-11-30: 25 mg via INTRAMUSCULAR

## 2014-11-30 MED ORDER — RANITIDINE HCL 150 MG PO TABS
150.0000 mg | ORAL_TABLET | Freq: Once | ORAL | Status: AC
Start: 2014-11-30 — End: 2014-11-30
  Administered 2014-11-30: 150 mg via ORAL

## 2014-11-30 MED ORDER — METHYLPREDNISOLONE SODIUM SUCC 125 MG IJ SOLR
125.0000 mg | Freq: Once | INTRAMUSCULAR | Status: AC
  Administered 2014-11-30: 125 mg via INTRAMUSCULAR

## 2014-11-30 MED ORDER — CETIRIZINE HCL 10 MG PO TABS
5.0000 mg | ORAL_TABLET | Freq: Once | ORAL | Status: AC
  Administered 2014-11-30: 5 mg via ORAL

## 2014-11-30 NOTE — Patient Instructions (Addendum)
We are going to use prednisone for your swelling.  You can also continue to use benadryl if needed but be cautious of sedation! If you have swelling such that you have pain or cannot pass urine please seek care right away

## 2014-11-30 NOTE — Progress Notes (Signed)
Urgent [facility] and [facility] [address] [place] [address] [id number]  Date:  11/30/2014   Name:  Joseph Curtis   DOB:  Mar 16, 1961   MRN:  [id number]  PCP:  No PCP Per Patient    Chief Complaint: Allergic Reaction   History of Present Illness:  Joseph Curtis is a 53 y.o. very pleasant male patient who presents with the following:  Here today as a new patient.  Brought back emergently as an "allergic reaction."  He seems sedated and his wife gives most of the history.  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  He was seen at his PCP office earlier today [facility]"- I am not familiar with this facility).  He seemed to be having an allergic reaction and "they told us to come straight to urgent care, don't go to the ER."  He had the appt today to get his chronic hydrocodone and alprazolam refilled.   This am early they noted that his genitals seemed to have "blisters," and then "it changed 5 different ways" according to his wife. He had "blisters, then knots, then redness" in the groin, and now swelling of the foreskin.  In addition he complains that 'my back feels like it's breaking, my head weighs 200 lbs."  He has complaint of generalized pain and malaise.  No fever noted  He has never had this issue in the past. They took 2 benadryl at home prior to coming in, about 3 hours ago  They also want to discuss his chronic pains in his back, hips; wonder if I have anything for pain to give him now.  However explaiend that I do not have any narcotic medications in clinic. 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  They have an epi- pen at  home.  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  "first he swells and then his heart will stop."   His wife gives a lengthly and tangential account of his health issues over many years as well as her own and other family members' health problems.   She reports that he has a history of "depression and PTSD" but no history of mania or bipolar disorder that they know of. She reports that he has been able to use prednisone in the past without adverse effects.   There are no active problems to display for this patient.   Past Medical History  Diagnosis Date  . Anxiety   . Depression   . Panic attacks   . Hypertension   . 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 Neck pain     chronic  . Back pain     chronic    No past surgical history on file.  History  Substance Use Topics  . Smoking status: Current Every Day Smoker  . Smokeless tobacco: Never Used  . Alcohol Use: No    No family history on file.  Allergies  Allergen Reactions  . Bee Venom Anaphylaxis  . Codeine Other (See Comments)    "My feet itch, burn--I can't stand  it."  . Penicillins     Childhood allergy    Medication list has been reviewed and updated.  Current Outpatient Prescriptions on File Prior to Visit  Medication Sig Dispense Refill  . ALPRAZolam (XANAX) 1 MG tablet Take 1 mg by mouth 3 (three) times daily as needed for anxiety.    Marland Kitchen. escitalopram (LEXAPRO) 20 MG tablet Take 20 mg by mouth every morning.    [place]. HYDROcodone-acetaminophen (NORCO) 10-325 MG per tablet Take 1 tablet by mouth 4 (four) times daily.     Marland Kitchen. losartan (COZAAR) 50 MG tablet Take 100 mg by mouth every morning.     . propranolol (INDERAL) 40 MG tablet Take 80 mg by mouth daily.     . rizatriptan (MAXALT) 10 MG tablet Take 10 mg by mouth daily as needed for migraine. May repeat in 2 hours if needed    . bisoprolol-hydrochlorothiazide (ZIAC) 2.5-6.25 MG per tablet Take 1 tablet by mouth every morning.    Marland Kitchen. ibuprofen (ADVIL,MOTRIN)  200 MG tablet Take 800 mg by mouth every 6 (six) hours as needed.    . Melatonin 10 MG CAPS Take 10 mg by mouth at bedtime.    Mar[place] Kitchen. OLANZapine (ZYPREXA) 5 MG tablet Take 5 mg by mouth every morning.     No current facility-administered medications on file prior to visit.    Review of Systems:  As per HPI- otherwise negative.   Physical Examination: Filed Vitals:   11/30/14 1114  BP: 137/65  Pulse: 98  Resp: 20   There were no vitals filed for this visit. There is no weight on file to calculate BMI. Ideal Body Weight:    GEN: WDWN, NAD, Non-toxic, responsive to questions and exam but seems sleepy HEENT: Atraumatic, Normocephalic. Neck supple. No masses, No LAD.  Bilateral TM wnl, oropharynx normal.  PEERL,EOMI.   Ears and Nose: No external deformity. CV: RRR, No M/G/R. No JVD. No thrill. No extra heart sounds. PULM: CTA B, no wheezes, crackles, rhonchi. No retractions. No resp. distress. No accessory muscle use. ABD: S, NT, ND, +BS. No rebound. No HSM. EXTR: No c/c/e NEURO Normal gait.  PSYCH: Normally interactive. Conversant. Not depressed or anxious appearing.  Calm demeanor.  GU: he has edema of the foreskin, more on the left side.  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    Given 25 mg IM benadryl, zyrtec and zantac once in clinic for swelling, also IM solumedrol  Pt then seemed to be extremely sleepy, not able to stay awake for exam/ conversation or sit up.  He was able to name the month but not the day of the week.  Noted to be hypotensive.  His wife admits he took "2 or 3" of his pain pills this morning, in addition to his chronic alprazolam and the benadryl.  She wishes to take him home but I was able to convince her that he is not ready to leave clinic.    Placed IV left AC and gave 1 liter of NS.  He rested and slept in clinc for about 90 minutes more.  Then woke up, was alert, ate crackers and a banana.  Looking much better, was alert and able to  ambulate to the bathroom on his own.  He was able to urinate    Edema of foreskin improved.  No evidence or oral swelling Assessment and Plan: Genital edema, male - Plan: predniSONE (DELTASONE) 20 MG tablet  Allergic reaction, initial encounter -  Plan: diphenhydrAMINE (BENADRYL) injection 25 mg, ranitidine (ZANTAC) tablet 150 mg, cetirizine (ZYRTEC) tablet 5 mg, methylPREDNISolone sodium succinate (SOLU-MEDROL) 125 mg/2 mL injection 125 mg  Chronic pain syndrome  Legg-Calve-Perthes disease, unspecified laterality  Panic disorder  Brought to see us urgently for an allergic reaction- this seems to have manifested as swelling of his foreskin.  Treated with IM benadryl, zantac and zyrtec as well as IM solu- medrol.  However this in combination with his pain medication (perhaps overused) caused him to become too sedated.  He was observed in clinic until these effects wore off and he was again alert.  He was able to eat and ambulate and was acting appropriately.   D/c to home with wife.  They have an epi-pen in case of any more serious allergic reaction, will use a short prednisone taper at home.  Follow-up if sx worsen or if swelling does not resolve  Meds ordered this encounter  Medications  . diphenhydrAMINE (BENADRYL) injection 25 mg    Sig:   . ranitidine (ZANTAC) tablet 150 mg    Sig:   . cetirizine (ZYRTEC) tablet 5 mg    Sig:   . risperiDONE (RISPERDAL) 0.5 MG tablet    Sig: Take 0.5 mg by mouth daily.  . methylPREDNISolone sodium succinate (SOLU-MEDROL) 125 mg/2 mL injection 125 mg    Sig:   . predniSONE (DELTASONE) 20 MG tablet    Sig: Take 2 pills a day for 3 days then 1 pill a day for 3 days    Dispense:  9 tablet    Refill:  0   See patient instructions for more details.     Signed Abbe AmsterdamJessica Copland, MD

## 2014-12-02 NOTE — Telephone Encounter (Signed)
A user error has taken place: encounter opened in error, closed for administrative reasons.

## 2015-06-05 ENCOUNTER — Encounter (HOSPITAL_COMMUNITY): Payer: Self-pay | Admitting: Emergency Medicine

## 2015-06-05 ENCOUNTER — Emergency Department (HOSPITAL_COMMUNITY)
Admission: EM | Admit: 2015-06-05 | Discharge: 2015-06-06 | Disposition: A | Discharge status: Home / Self Care | Payer: Medicare Other | Attending: Emergency Medicine | Admitting: Emergency Medicine

## 2015-06-05 DIAGNOSIS — G4700 Insomnia, unspecified: Secondary | ICD-10-CM | POA: Diagnosis not present

## 2015-06-05 DIAGNOSIS — Z9181 History of falling: Secondary | ICD-10-CM | POA: Insufficient documentation

## 2015-06-05 DIAGNOSIS — G8929 Other chronic pain: Secondary | ICD-10-CM | POA: Insufficient documentation

## 2015-06-05 DIAGNOSIS — I10 Essential (primary) hypertension: Secondary | ICD-10-CM | POA: Diagnosis not present

## 2015-06-05 DIAGNOSIS — F419 Anxiety disorder, unspecified: Secondary | ICD-10-CM | POA: Insufficient documentation

## 2015-06-05 DIAGNOSIS — G2401 Drug induced subacute dyskinesia: Secondary | ICD-10-CM | POA: Insufficient documentation

## 2015-06-05 DIAGNOSIS — R000 Tachycardia, unspecified: Secondary | ICD-10-CM | POA: Diagnosis present

## 2015-06-05 DIAGNOSIS — T50995A Adverse effect of other drugs, medicaments and biological substances, initial encounter: Secondary | ICD-10-CM | POA: Insufficient documentation

## 2015-06-05 DIAGNOSIS — Z880 Allergy status to penicillin: Secondary | ICD-10-CM | POA: Diagnosis not present

## 2015-06-05 DIAGNOSIS — Z720 Tobacco use: Secondary | ICD-10-CM | POA: Diagnosis not present

## 2015-06-05 DIAGNOSIS — F329 Major depressive disorder, single episode, unspecified: Secondary | ICD-10-CM | POA: Insufficient documentation

## 2015-06-05 DIAGNOSIS — Z79899 Other long term (current) drug therapy: Secondary | ICD-10-CM | POA: Diagnosis not present

## 2015-06-05 MED ORDER — METHYLPREDNISOLONE SODIUM SUCC 125 MG IJ SOLR
125.0000 mg | Freq: Once | INTRAMUSCULAR | Status: AC
  Administered 2015-06-06: 125 mg via INTRAVENOUS
  Filled 2015-06-05: qty 2

## 2015-06-05 MED ORDER — DIPHENHYDRAMINE HCL 50 MG/ML IJ SOLN
25.0000 mg | Freq: Once | INTRAMUSCULAR | Status: AC
  Administered 2015-06-06: 25 mg via INTRAVENOUS
  Filled 2015-06-05: qty 1

## 2015-06-05 MED ORDER — SODIUM CHLORIDE 0.9 % IV BOLUS (SEPSIS)
1000.0000 mL | Freq: Once | INTRAVENOUS | Status: AC
  Administered 2015-06-06: 1000 mL via INTRAVENOUS

## 2015-06-05 MED ORDER — FAMOTIDINE IN NACL 20-0.9 MG/50ML-% IV SOLN
20.0000 mg | Freq: Once | INTRAVENOUS | Status: AC
  Administered 2015-06-06: 20 mg via INTRAVENOUS
  Filled 2015-06-05: qty 50

## 2015-06-05 NOTE — ED Notes (Signed)
Per EMS daughter called ems to house due to patient's inability to sleep and tongue swelling. Per patient patient has been unable to sleep for past 3 days and had been chewing on his tongue and is unable to stop. Patient states chronic back pain and has been out of his anxiety medication and pain med since June 3.

## 2015-06-06 DIAGNOSIS — G2401 Drug induced subacute dyskinesia: Secondary | ICD-10-CM | POA: Diagnosis not present

## 2015-06-06 MED ORDER — BENZTROPINE MESYLATE 1 MG/ML IJ SOLN
0.5000 mg | Freq: Once | INTRAMUSCULAR | Status: AC
  Administered 2015-06-06: 0.5 mg via INTRAVENOUS
  Filled 2015-06-06: qty 2

## 2015-06-06 MED ORDER — PROPRANOLOL HCL 40 MG PO TABS: 80.0000 mg | ORAL_TABLET | Freq: Every day | ORAL | Status: DC

## 2015-06-06 MED ORDER — BENZTROPINE MESYLATE 1 MG PO TABS: 1.0000 mg | ORAL_TABLET | Freq: Two times a day (BID) | ORAL | Status: DC

## 2015-06-06 NOTE — ED Provider Notes (Signed)
Medical screening examination/treatment/procedure(s) were conducted as a shared visit with non-physician practitioner(s) and myself.  I personally evaluated the patient during the encounter.   EKG Interpretation None      Patient here with trouble sleeping as well as some tongue swelling. He has been office and a psychotic for 18 days. Denies any pruritus. On exam his tongue is not occluding his airway. He does not have any lip swelling. No rashes noted. His uvula is normal. He does have some tardive dyskinesia symptoms and I believe that this is what is causing his tongue to be slightly swollen but no signs of overt trauma. Will give Cogentin. And monitor   Lorre Nick, MD 06/06/15 0100

## 2015-06-06 NOTE — ED Provider Notes (Signed)
CSN: [id number]     Arrival date & time 06/05/15  2312 History   First MD Initiated Contact with Patient 06/05/15 2313     Chief Complaint  Patient presents with  . Insomnia  . Tachycardia     (Consider location/radiation/quality/duration/timing/severity/associated sxs/prior Treatment) HPI  Joseph Curtis is a 54 y.o. male presenting with insomnia as well as tongue swelling that started today.Per daughter pt has been chewing on his tongue is unable to stop. Patient hasn't taken anything her symptoms and he denies any alleviating factors. Patient states he's been out of his anxiety medication believes is contributing. Pt also reports difficulty sleeping for the last 3 days. He has not had his pain medications or anxiety medication since June 3. Patient denies any new medications, new foods, insect bites, tick bites, new lotions, detergents, fevers, chills, shortness, difficulty breathing, nausea, vomiting, abdominal pain. No HI, SI, hallucinations, self-harm, alcohol use. Patient denies illicit drug use. No IV drug use.   Past Medical History  Diagnosis Date  . Anxiety   . Depression   . Panic attacks   . Hypertension   . 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 Neck pain     chronic  . Back pain     chronic   History reviewed. No pertinent past surgical history. No family history on file. History  Substance Use Topics  . Smoking status: Current Every Day Smoker  . Smokeless tobacco: Never Used  . Alcohol Use: No    Review of Systems 10 Systems reviewed and are negative for acute change except as noted in the HPI.    Allergies  Bee venom; Codeine; and Penicillins  Home Medications   Prior to Admission medications   Medication Sig Start Date End Date Taking? Authorizing Provider  ALPRAZolam Prudy Feeler) 1 MG tablet Take 1 mg by mouth 3 (three) times daily as needed for anxiety.    Historical Provider, MD  benztropine (COGENTIN) 1 MG tablet Take 1 tablet (1 mg total) by mouth 2  (two) times daily. 06/06/15   Oswaldo Conroy, PA-C  bisoprolol-hydrochlorothiazide [facility]) 2.5-6.25 MG per tablet Take 1 tablet by mouth every morning.    Historical Provider, MD  escitalopram (LEXAPRO) 20 MG tablet Take 20 mg by mouth every morning.    Historical Provider, MD  HYDROcodone-acetaminophen (NORCO) 10-325 MG per tablet Take 1 tablet by mouth 4 (four) times daily.     Historical Provider, MD  ibuprofen (ADVIL,MOTRIN) 200 MG tablet Take 800 mg by mouth every 6 (six) hours as needed.    Historical Provider, MD  losartan (COZAAR) 50 MG tablet Take 100 mg by mouth every morning.     Historical Provider, MD  Melatonin 10 MG CAPS Take 10 mg by mouth at bedtime.    Historical Provider, MD  OLANZapine (ZYPREXA) 5 MG tablet Take 5 mg by mouth every morning.    Historical Provider, MD  predniSONE (DELTASONE) 20 MG tablet Take 2 pills a day for 3 days then 1 pill a day for 3 days 11/30/14   Pearline Cables, MD  propranolol (INDERAL) 40 MG tablet Take 80 mg by mouth daily.     Historical Provider, MD  propranolol (INDERAL) 40 MG tablet Take 2 tablets (80 mg total) by mouth daily. 06/06/15   Oswaldo Conroy, PA-C  risperiDONE (RISPERDAL) 0.5 MG tablet Take 0.5 mg by mouth daily.    Historical Provider, MD  rizatriptan (MAXALT) 10 MG tablet Take 10 mg by mouth daily  as needed for migraine. May repeat in 2 hours if needed    Historical Provider, MD   BP 117/93 mmHg  Pulse 95  Temp(Src) 98.3 F (36.8 C) (Oral)  Resp 31  Wt 168 lb (76.204 kg)  SpO2 99% Physical Exam  Constitutional: He appears well-developed and well-nourished. No distress.  HENT:  Head: Normocephalic and atraumatic.  Mild tongue swelling without oropharynx involvement. No oropharynx lesions. No facial or lip swelling. Pt tolerating secretions. No uvula deviation or swelling.  Eyes: Conjunctivae and EOM are normal. Right eye exhibits no discharge. Left eye exhibits no discharge.  Cardiovascular: Normal rate and regular rhythm.    Pulmonary/Chest: Effort normal and breath sounds normal. No stridor. No respiratory distress. He has no wheezes.  Abdominal: Soft. Bowel sounds are normal. He exhibits no distension. There is no tenderness.  Neurological: He is alert. He exhibits normal muscle tone. Coordination normal.  Speech fluent and goal oriented. Patient moves all four extremities without any focal neurological deficits and has no facial droop. Patient with bilateral tremor. Normal gait.   Skin: Skin is warm and dry. He is not diaphoretic.  Nursing note and vitals reviewed.   ED Course  Procedures (including critical care time) Labs Review Labs Reviewed - No data to display  Imaging Review No results found.   EKG Interpretation None      MDM   Final diagnoses:  Tardive dyskinesia   Pt with long standing history of antipsychotic use (zyprexa and risperdal) who presents with tongue swelling. No rash, facial or lip swelling. Pt tolerating secretions. Mild tongue swelling without concern for airway occlusion. Pt with tardive dyskinesia. Pt given dose of cogentin in ED and script. Pt to continue to not take antipsychotics. Also refilled patients propanolol. Pt given referral to [facility] as well as a list of ED resources to establish care with PCP and behavioral health clinic. Patient is afebrile, nontoxic, and in no acute distress. Patient is appropriate for outpatient management and is stable for discharge.  Discussed return precautions with patient. Discussed all results and patient verbalizes understanding and agrees with plan.  This is a shared patient. This patient was discussed with the physician who saw and evaluated the patient and agrees with the plan.     Oswaldo Conroy, PA-C 06/07/15 1508  Lorre Nick, MD 06/08/15 1710

## 2015-06-06 NOTE — Discharge Instructions (Signed)
Return to the emergency room with worsening of symptoms, new symptoms or with symptoms that are concerning, especially difficulty breathing, swallowing, facial or lip swelling, drooling, rash, ich. Please call your doctor for a followup appointment within 24-48 hours. When you talk to your doctor please let them know that you were seen in the emergency department and have them acquire all of your records so that they can discuss the findings with you and formulate a treatment plan to fully care for your new and ongoing problems. If you do not have a primary care provider please call the number below under ED resources to establish care with a provider and follow up.  Read below information and follow recommendations.  Tardive Dyskinesia Tardive dyskinesia is a neurological syndrome. It is caused by the long-term use of neuroleptic drugs. Those drugs are generally prescribed for psychiatric disorders, as well as for some gastrointestinal and neurological disorders. This syndrome causes repetitive, involuntary, purposeless movements. SYMPTOMS   Grimacing.  Tongue protrusion.  Lip smacking, puckering, and pursing.  Rapid eye blinking.  Rapid movements of the arms, legs, and trunk.  Impaired movements of the fingers. It may appear as though the patient is playing an invisible guitar or piano. TREATMENT  There is no standard treatment for this syndrome. Treatment is highly individualized. The first step is generally to stop or minimize the use of the neuroleptic drug. But for patients with a severe underlying condition this may not be a feasible option. Replacing the neuroleptic drug with substitute drugs may help some patients. Other drugs may also be helpful. Examples include:  Benzodiazepines.  Adrenergic antagonists.  Dopamine agonists. Symptoms may last long after the neuroleptic drugs have been stopped. But, with careful management, some symptoms may improve and/or disappear with  time. Document Released: 11/22/2002 Document Revised: 02/24/2012 Document Reviewed: 02/04/2014 [facility] Patient Information 2015 [place], [place]. This information is not intended to replace advice given to you by your health care provider. Make sure you discuss any questions you have with your health care provider.    Emergency Department Resource Guide 1) Find a Doctor and Pay Out of Pocket Although you won't have to find out who is covered by your insurance plan, it is a good idea to ask around and get recommendations. You will then need to call the office and see if the doctor you have chosen will accept you as a new patient and what types of options they offer for patients who are self-pay. Some doctors offer discounts or will set up payment plans for their patients who do not have insurance, but you will need to ask so you aren't surprised when you get to your appointment.  2) Contact Your Local Health Department Not all health departments have doctors that can see patients for sick visits, but many do, so it is worth a call to see if yours does. If you don't know where your local health department is, you can check in your phone book. The CDC also has a tool to help you locate your state's health department, and many state websites also have listings of all of their local health departments.  3) Find a Walk-in Clinic If your illness is not likely to be very severe or complicated, you may want to try a walk in clinic. These are popping up all over the country in pharmacies, drugstores, and shopping centers. They're usually staffed by nurse practitioners or physician assistants that have been trained to treat common illnesses and complaints. They're usually  fairly quick and inexpensive. However, if you have serious medical issues or chronic medical problems, these are probably not your best option.  No Primary Care Doctor: - Call Health Connect at  [phone] - they can help you locate a primary  care doctor that  accepts your insurance, provides certain services, etc. - Physician Referral Service- [phone]  Chronic Pain Problems: Organization         Address  Phone   Notes  [facility]  [phone] Patients need to be referred by their primary care doctor.   Medication Assistance: Organization         Address  Phone   Notes  [facility] Medication [facility] [address] [place] [place], [place] [address] [phone] --Must be a resident of [facility] -- Must have NO insurance coverage whatsoever (no Medicaid/ Medicare, etc.) -- The pt. MUST have a primary care doctor that directs their care regularly and follows them in the community   MedAssist  [phone]   [employer]  [phone]    Agencies that provide inexpensive medical care: Organization         Address  Phone   Notes  [facility]  [phone]   [facility]    [phone]   [facility] [address] [place], [place] [address] [phone]   [facility] [address], [place] [phone]   Planned Parenthood    [phone]   [facility]    [phone]   [facility]  [address], [place] Phone:  [phone], Fax:  [phone] Hours of Operation:  9 am - 6 pm, M-F.  Also accepts Medicaid/Medicare and self-pay.  [facility]  [address], [place] Phone: [phone], Fax: [phone]. Hours of Operation:  8:30 am - 5:30 pm, M-F.  Also accepts Medicaid and self-pay.  [facility] [place] [address], [place] Phone: [phone]   [facility] [address] Natasha Bence [place], [place] [phone], Ext. [phone] Mondays & Thursdays: 7-9 AM.  First 15 patients are seen on a first come, first serve basis.    Medicaid-accepting [facility]  Providers:  Organization         Address  Phone   Notes  [facility] [address], [place] [phone] Also accepts self-pay patients.  [facility] [address] Laurell Josephs [place], [place]  [phone]   [facility] [address], [place] [phone]   [facility] [address], [place] [phone]   Renaye Rakers [address], [place]   [phone] Only accepts [place] Access [place] patients after they have their name applied to their card.   Self-Pay (no insurance) in [facility]:  Organization         Address  Phone   Notes  Sickle Cell Patients, [facility] [address] [place], [place] [phone]   [facility] [address] [place], [place] [phone]   [facility] [place]  [address], [place] [phone]   [facility]/Dr. Osei-Bonsu  [address] [place] or [address], [place] [phone] Phone  number for both [place] and [place] locations is the same.  Urgent Medical and [facility] [address] [place] [phone]   [facility] [address], [place] or [address] [phone]   [facility] [address], [place] [phone], phone; [phone], fax Sees patients 1st and 3rd Saturday of every month.  Must not qualify for public or private insurance (i.e. Medicaid, Medicare, [place] Health Choice, Veterans' Benefits)  Household income should be no more than 200% of the poverty level The clinic cannot treat you if you are pregnant or think you are pregnant  Sexually transmitted diseases are not treated at the clinic.    Dental Care: Organization         Address  Phone  Notes  [facility] Department of [facility] [address] [place], [place] [phone] Accepts children up to age 16 who are enrolled in [place] or [place] Health Choice; pregnant women with a Medicaid card; and children who have applied for Medicaid or [place] Health Choice, but were declined, whose parents can pay a reduced fee at time of service.  [facility] Department of [facility]  [address], [place] [phone] Accepts children up to age 65 who are enrolled in [place] or [place] Health Choice; pregnant women with a Medicaid card; and children who have applied for Medicaid or [place] Health Choice, but were declined, whose parents can pay a reduced fee at time of service.  [facility]  [address] [place], [place] [phone] Patients are seen by appointment only. Walk-ins are not accepted. [facility] will see patients 30 years of age and older. Monday - Tuesday (8am-5pm) Most Wednesdays (8:30-5pm) $30 per visit, cash only  [facility] Adult Dental Access [facility]  [address], [facility] [phone] Patients are seen by appointment only. Walk-ins are not accepted. [facility] will see patients 80 years of age and older. One Wednesday Evening (Monthly: Volunteer Based).  $30 per visit, cash only  [employer]  [phone] for adults; Children under age 59, call Graduate Pediatric Dentistry at [phone]. Children aged 85-14, please call [phone] to request a pediatric application.  Dental services are provided in all areas of dental care including fillings, crowns and bridges, complete and partial dentures, implants, gum treatment, root canals, and extractions. Preventive care is also provided. Treatment is provided to both adults and children. Patients are selected via a lottery and there is often a waiting list.   [facility] [address] [place]  [phone] [email or web address]   [facility]  [address] [place], [place] [phone], Ext. [phone] Second and Fourth Thursday of each month, opens at 6:30 AM; Clinic ends at 9 AM.  Patients are seen on a first-come first-served basis, and a limited number are seen during each clinic.   [facility]  [address] Ether Griffins [place], [place] [phone]   Eligibility Requirements You must have lived in [place], [place], or [place] counties for at least the last three months.   You cannot be eligible for state or federal sponsored [place], including [employer], [place], or [employer].   You generally cannot be eligible for healthcare insurance through your employer.    How to apply: Eligibility screenings are held every Tuesday and Wednesday afternoon from 1:00 pm  until 4:00 pm. You do not need an appointment for the interview!  [facility] [address] [place], [place] [phone]   [facility] Health Department  [phone]   [facility] Health Department  [phone]   [facility] Health Department  [phone]    Behavioral Health Resources in the Community: Intensive Outpatient Programs Organization         Address  Phone  Notes  [facility] [address] [place], [place] [phone]   [facility] Outpatient [address], [place], [place] [phone]   [facility] [address] [place], [place]  [phone]   [facility] Mental Health [address],  [place], [place] [phone] or [phone]   Substance Abuse Resources Organization         Address  Phone  Notes  [facility] Drug [facility]  [phone]   [facility]  ([phone]   [facility]  [phone]   Floydene Flock  [phone]   Residential & Outpatient Substance Abuse Program  [phone]   Psychological Services Organization         Address  Phone  Notes  [facility] Health  [phone]    [facility] Services  [phone]   [facility] Mental Health [address] [place] [phone] or [phone]    Mobile Crisis Teams Organization         Address  Phone  Notes  [facility], Mobile Crisis Care Unit  [phone]   [facility]  [address] [place], [place] [phone]   Doristine Locks [address] [place] [phone]    Self-Help/Support Groups Organization         Address  Phone             Notes  [facility] [place] - variety of support groups  336- I7437963 Call for more information  Narcotics Anonymous (NA), [facility] [address], [employer] [place]  2 meetings at this location   Statistician         Address  Phone  Notes  [facility] [address],    [place]  [phone]   [facility]  [address], [place] [address], [place], [place] [phone]   [facility] [address] [place], [place] [phone] Admissions: 8am-3pm M-F  [facility] [address],    [place], [place] [phone]   [facility] [address] [place], [place], [place] [phone]   [facility] [address]  [place], [place] [phone]   [facility] - Intensive Outpatient [address] Laurell Josephs [address], [place], [place] [phone]   [facility] (Addiction Recovery [facility] [address] [place]  [place], [place] [phone] or [phone]   [facility] ([facility]) [address] [place], [place] [phone] Accepts Medicaid  [facility] [address]  [place] [phone] Substance Abuse/Addiction Treatment   [facility] Organization         Address  Phone  Notes  [facility]  [phone]) [phone]   Angie Fava, PhD [address] Ervin Knack [place], [place]   (678)[phone] or [phone]    [facility] Behavioral   [address] [place], [place] [phone]   [facility] [address] [place], [place] [phone] Insurance/Medicaid/sponsorship through [employer] and Families [address]                                    [place], [place] [phone] Therapy/tele-psych/case  °[facility] [address]  ° [place] ([phone]) [phone]    °Dr. Arfeen  ([phone]   °[facility] [place]  [employer] [place] [facility] 1) [address],  °2) [address], [place] °3)  [address], [place] [phone]) [phone] °([phone]) [phone] ° °([phone]) [phone]   °[place] Child Abuse Hotline (336) 342-1394 or (336) [phone] (After Hours)    ° ° ° °

## 2015-06-15 ENCOUNTER — Emergency Department (HOSPITAL_COMMUNITY)
Admission: EM | Admit: 2015-06-15 | Discharge: 2015-06-17 | Disposition: A | Discharge status: Other Acute Inpatient Hospital | Payer: Medicare Other | Attending: Emergency Medicine | Admitting: Emergency Medicine

## 2015-06-15 ENCOUNTER — Emergency Department (HOSPITAL_COMMUNITY): Payer: Medicare Other

## 2015-06-15 ENCOUNTER — Encounter (HOSPITAL_COMMUNITY): Payer: Self-pay | Admitting: *Deleted

## 2015-06-15 DIAGNOSIS — G8929 Other chronic pain: Secondary | ICD-10-CM | POA: Diagnosis not present

## 2015-06-15 DIAGNOSIS — T50995A Adverse effect of other drugs, medicaments and biological substances, initial encounter: Secondary | ICD-10-CM | POA: Diagnosis not present

## 2015-06-15 DIAGNOSIS — Z9181 History of falling: Secondary | ICD-10-CM | POA: Insufficient documentation

## 2015-06-15 DIAGNOSIS — F4310 Post-traumatic stress disorder, unspecified: Secondary | ICD-10-CM | POA: Diagnosis not present

## 2015-06-15 DIAGNOSIS — Z720 Tobacco use: Secondary | ICD-10-CM | POA: Insufficient documentation

## 2015-06-15 DIAGNOSIS — D72829 Elevated white blood cell count, unspecified: Secondary | ICD-10-CM | POA: Insufficient documentation

## 2015-06-15 DIAGNOSIS — F419 Anxiety disorder, unspecified: Secondary | ICD-10-CM | POA: Diagnosis present

## 2015-06-15 DIAGNOSIS — I10 Essential (primary) hypertension: Secondary | ICD-10-CM | POA: Diagnosis not present

## 2015-06-15 DIAGNOSIS — Z880 Allergy status to penicillin: Secondary | ICD-10-CM | POA: Insufficient documentation

## 2015-06-15 DIAGNOSIS — F410 Panic disorder [episodic paroxysmal anxiety] without agoraphobia: Secondary | ICD-10-CM | POA: Diagnosis not present

## 2015-06-15 DIAGNOSIS — R4182 Altered mental status, unspecified: Secondary | ICD-10-CM

## 2015-06-15 DIAGNOSIS — F329 Major depressive disorder, single episode, unspecified: Secondary | ICD-10-CM | POA: Insufficient documentation

## 2015-06-15 DIAGNOSIS — Z79899 Other long term (current) drug therapy: Secondary | ICD-10-CM | POA: Diagnosis not present

## 2015-06-15 DIAGNOSIS — F1210 Cannabis abuse, uncomplicated: Secondary | ICD-10-CM | POA: Diagnosis not present

## 2015-06-15 DIAGNOSIS — G2401 Drug induced subacute dyskinesia: Secondary | ICD-10-CM | POA: Insufficient documentation

## 2015-06-15 DIAGNOSIS — R440 Auditory hallucinations: Secondary | ICD-10-CM | POA: Diagnosis present

## 2015-06-15 LAB — URINALYSIS, ROUTINE W REFLEX MICROSCOPIC
Glucose, UA: NEGATIVE mg/dL
Hgb urine dipstick: NEGATIVE
KETONES UR: NEGATIVE mg/dL
LEUKOCYTES UA: NEGATIVE
NITRITE: NEGATIVE
PH: 6 (ref 5.0–8.0)
PROTEIN: NEGATIVE mg/dL
Specific Gravity, Urine: 1.024 (ref 1.005–1.030)
Urobilinogen, UA: 1 mg/dL (ref 0.0–1.0)

## 2015-06-15 LAB — COMPREHENSIVE METABOLIC PANEL
ALK PHOS: 112 U/L (ref 38–126)
ALT: 75 U/L — ABNORMAL HIGH (ref 17–63)
ANION GAP: 12 (ref 5–15)
AST: 31 U/L (ref 15–41)
Albumin: 3.9 g/dL (ref 3.5–5.0)
BUN: 18 mg/dL (ref 6–20)
CHLORIDE: 103 mmol/L (ref 101–111)
CO2: 23 mmol/L (ref 22–32)
CREATININE: 1.05 mg/dL (ref 0.61–1.24)
Calcium: 9.3 mg/dL (ref 8.9–10.3)
GFR calc non Af Amer: >60 mL/min (ref >60)
Glucose, Bld: 87 mg/dL (ref 65–99)
POTASSIUM: 3.9 mmol/L (ref 3.5–5.1)
Sodium: 138 mmol/L (ref 135–145)
Total Bilirubin: 0.8 mg/dL (ref 0.3–1.2)
Total Protein: 7.6 g/dL (ref 6.5–8.1)

## 2015-06-15 LAB — CBC
HCT: 40.8 % (ref 39.0–52.0)
HEMOGLOBIN: 13.9 g/dL (ref 13.0–17.0)
MCH: 30.9 pg (ref 26.0–34.0)
MCHC: 34.1 g/dL (ref 30.0–36.0)
MCV: 90.7 fL (ref 78.0–100.0)
Platelets: 416 10*3/uL — ABNORMAL HIGH (ref 150–400)
RBC: 4.5 MIL/uL (ref 4.22–5.81)
RDW: 14 % (ref 11.5–15.5)
WBC: 19 10*3/uL — AB (ref 4.0–10.5)

## 2015-06-15 LAB — ETHANOL: Alcohol, Ethyl (B): <5 mg/dL (ref <5)

## 2015-06-15 LAB — RAPID URINE DRUG SCREEN, HOSP PERFORMED
AMPHETAMINES: NOT DETECTED
BARBITURATES: NOT DETECTED
Benzodiazepines: NOT DETECTED
Cocaine: NOT DETECTED
Opiates: NOT DETECTED
Tetrahydrocannabinol: POSITIVE — AB

## 2015-06-15 MED ORDER — ACETAMINOPHEN 325 MG PO TABS
650.0000 mg | ORAL_TABLET | ORAL | Status: DC | PRN
  Administered 2015-06-15: 650 mg via ORAL
  Filled 2015-06-15: qty 2

## 2015-06-15 MED ORDER — ESCITALOPRAM OXALATE 10 MG PO TABS
20.0000 mg | ORAL_TABLET | Freq: Every morning | ORAL | Status: DC
  Administered 2015-06-16 – 2015-06-17 (×2): 20 mg via ORAL
  Filled 2015-06-15 (×2): qty 2

## 2015-06-15 MED ORDER — STERILE WATER FOR INJECTION IJ SOLN
INTRAMUSCULAR | Status: AC
  Administered 2015-06-15: 1.2 mL
  Filled 2015-06-15: qty 10

## 2015-06-15 MED ORDER — LORAZEPAM 0.5 MG PO TABS
1.0000 mg | ORAL_TABLET | Freq: Three times a day (TID) | ORAL | Status: DC | PRN
  Administered 2015-06-16 – 2015-06-17 (×2): 1 mg via ORAL
  Filled 2015-06-15 (×2): qty 2

## 2015-06-15 MED ORDER — LORAZEPAM 2 MG/ML IJ SOLN
1.0000 mg | Freq: Once | INTRAMUSCULAR | Status: AC
  Administered 2015-06-15: 1 mg via INTRAVENOUS
  Filled 2015-06-15: qty 1

## 2015-06-15 MED ORDER — OLANZAPINE 5 MG PO TABS
5.0000 mg | ORAL_TABLET | Freq: Every morning | ORAL | Status: DC
  Administered 2015-06-16: 5 mg via ORAL
  Filled 2015-06-15: qty 1

## 2015-06-15 MED ORDER — ZIPRASIDONE MESYLATE 20 MG IM SOLR
10.0000 mg | Freq: Once | INTRAMUSCULAR | Status: AC
  Administered 2015-06-15: 10 mg via INTRAMUSCULAR
  Filled 2015-06-15: qty 20

## 2015-06-15 MED ORDER — SODIUM CHLORIDE 0.9 % IV BOLUS (SEPSIS)
1000.0000 mL | Freq: Once | INTRAVENOUS | Status: AC
  Administered 2015-06-15: 1000 mL via INTRAVENOUS

## 2015-06-15 MED ORDER — LOSARTAN POTASSIUM 50 MG PO TABS
100.0000 mg | ORAL_TABLET | Freq: Every morning | ORAL | Status: DC
  Administered 2015-06-16 – 2015-06-17 (×2): 100 mg via ORAL
  Filled 2015-06-15 (×2): qty 2

## 2015-06-15 NOTE — ED Notes (Signed)
TTS consult unsuccessful due to patient's mental state.

## 2015-06-15 NOTE — Progress Notes (Addendum)
Patient was referred for IP treatment at: [facility] - per Mickeal SkinnerPhoebe, "Fax referral, we have beds." [facility] - per Thayer Ohmhris, "Fax referral for review." [facility] - per Olegario MessierKathy, "We have some beds available". [facility] - per intake, have beds. [facility] - per Darl PikesSusan, may have bed. [facility] - per intake, "Fax referral for review." Per Fayrene FearingJames, referral under review. [facility] - per Thayer Ohmhris, male adult and geriatric beds open. [facility] - per intake, fax it.  Declined at: [facility] - per Morrie SheldonAshley, due to acuity.  At [facility]: [facility], and [facility].  CSW will continue to seek placement/follow up.  [place] Abtsatia Particia Strahm, LCSWA Disposition staff 06/15/2015 7:58 PM

## 2015-06-15 NOTE — ED Notes (Signed)
Asked for urine sample pt is unable to follow instructions to  provide one

## 2015-06-15 NOTE — ED Notes (Signed)
MD at bedside. 

## 2015-06-15 NOTE — ED Notes (Signed)
Patient transported to CT 

## 2015-06-15 NOTE — BH Assessment (Addendum)
Tele Assessment Note   Joseph Curtis is an 54 y.o. male. Pt arrived via EMS. This writer could not obtain information from the Pt due to the Pt being incoherent, having flight of ideas, and speaking to individuals who were not present. The Pt appeared to be in restraints. While trying to conduct the assessment the Pt was trying to get out of his restraints.   Collateral contact: Pt's daughter Joseph Curtis. reports that her father has a long mental health history. According to the Mrs. Joseph Curtis, her father has been diagnosed with Panic Disorder, Anxiety, and Bipolar Disorder. She states that the Pt has been prescribed Xanax for 16 years, but he has not had his medication in a month. According to Mrs. Curtis on 06/05/15 she took the Pt to the ED because of he was chewing the sides of his mouth. Mrs. Joseph Curtis states that she feels that chewing the side of his mouth was a side effect of not having his medication. Mrs. Joseph Curtis also states that the Pt had severe jerking of the muscles as well. Mrs. Joseph Curtis reports that the Pt was provided with 2 medications (she cannot recall their names). Mrs. Joseph Curtis states that the Pt then began to have hallucinations. She states that the Pt began talking about aliens and aliens putting things in teeth. According to Mrs. Curtis, the Pt began to try to extract his own teeth. Mrs. Joseph Curtis states that the Pt's hallucinations have continued to worsen. It was reported that the Pt had a traumatic childhood and it has negatively affected him. The writer attempted to contact Joseph Curtis Down the Pt's wife but was unsuccessful.  This Clinical research associatewriter consulted with Joseph CottonJosephine, NP. Per Josephine Pt will be observed overnight. Pt pending am psych evaluation.   Axis I: Anxiety Disorder NOS, Major Depression, Recurrent severe and Post Traumatic Stress Disorder Axis II: Deferred Axis III:  Past Medical History  Diagnosis Date  . Anxiety   . Depression   . Panic attacks   . Hypertension   . Fall      [place] [employer] [place]  . Neck pain     chronic  . Back pain     chronic   Axis IV: educational problems, other psychosocial or environmental problems, problems related to social environment and problems with primary support group Axis V: 41-50 serious symptoms  Past Medical History:  Past Medical History  Diagnosis Date  . Anxiety   . Depression   . Panic attacks   . Hypertension   . Fall     from [employer]  . Neck pain     chronic  . Back pain     chronic    History reviewed. No pertinent past surgical history.  Family History: History reviewed. No pertinent family history.  Social History:  reports that he has been smoking.  He has never used smokeless tobacco. He reports that he does not drink alcohol. His drug history is not on file.  Additional Social History:  Alcohol / Drug Use Pain Medications: UTA Prescriptions: UTA Over the Counter: UTA History of alcohol / drug use?: No history of alcohol / drug abuse Longest period of sobriety (when/how long): Unknown  CIWA: CIWA-Ar BP: 161/100 mmHg Pulse Rate: 69 COWS:    PATIENT STRENGTHS: (choose at least two) Average or above average intelligence Capable of independent living Communication skills  Allergies:  Allergies  Allergen Reactions  . Bee Venom Anaphylaxis  . Codeine Other (See Comments)    "My feet itch, burn--I  can't stand it."  . Penicillins     Childhood allergy    Home Medications:  (Not in a hospital admission)  OB/GYN Status:  No LMP for male patient.  General Assessment Data Location of Assessment: [facility] TTS Assessment: In system Is this a Tele or Face-to-Face Assessment?: Tele Assessment Is this an Initial Assessment or a Re-assessment for this encounter?: Initial Assessment Marital status: Married [place] name: NA Is patient pregnant?: No Pregnancy Status: No Living Arrangements: Spouse/significant other Can pt return to current living arrangement?: Yes Admission  Status: Voluntary Is patient capable of signing voluntary admission?: Yes Referral Source: Self/Family/Friend Insurance type: Medicare     Crisis Care Plan Living Arrangements: Spouse/significant other Name of Psychiatrist: Unknown Name of Therapist: Unknown  Education Status Is patient currently in school?: No (UTA) Current Grade: NA Highest grade of school patient has completed: Unknown Name of school: NA Contact person: NA  Risk to self with the past 6 months Suicidal Ideation: No (UTA) Has patient been a risk to self within the past 6 months prior to admission? : Other (comment) (UTA) Suicidal Intent: No (UTA) Has patient had any suicidal intent within the past 6 months prior to admission? : No (UTA) Is patient at risk for suicide?: No (UTA) Suicidal Plan?: No (UTA) Has patient had any suicidal plan within the past 6 months prior to admission? : Other (comment) (UTA) Access to Means: No (UTA) What has been your use of drugs/alcohol within the last 12 months?: UTA Previous Attempts/Gestures: No (UTA) How many times?: 0 (UTA) Other Self Harm Risks: UTA Triggers for Past Attempts: Other (Comment) (UTA) Intentional Self Injurious Behavior: None (UTA) Family Suicide History: Unknown Recent stressful life event(s): Other (Comment) (UTA) Persecutory voices/beliefs?: Yes (Informed EDP) Depression: No (UTA) Depression Symptoms:  (UTA) Substance abuse history and/or treatment for substance abuse?: No Suicide prevention information given to non-admitted patients: Not applicable  Risk to Others within the past 6 months Homicidal Ideation: No (UTA) Does patient have any lifetime risk of violence toward others beyond the six months prior to admission? : Unknown Thoughts of Harm to Others: No (UTA) Current Homicidal Intent: No Current Homicidal Plan: No (UTA) Access to Homicidal Means: No (UTA) Identified Victim: NA History of harm to others?: No (UTA) Assessment of Violence:  None Noted (UTA) Violent Behavior Description: Unknown (UTA) Does patient have access to weapons?: No (UTA) Criminal Charges Pending?: No (UTA) Does patient have a court date: No (UTA) Is patient on probation?: Unknown  Psychosis Hallucinations: Auditory Delusions: None noted  Mental Status Report Appearance/Hygiene: Disheveled Eye Contact: Poor Motor Activity: Freedom of movement Speech: Incoherent, Rapid Level of Consciousness: Combative, Irritable Mood: Irritable Affect: Irritable Anxiety Level: Minimal Thought Processes: Irrelevant Judgement: Impaired Orientation: Unable to assess Obsessive Compulsive Thoughts/Behaviors: Unable to Assess  Cognitive Functioning Concentration: Unable to Assess Memory: Unable to Assess IQ: Average Insight: Unable to Assess Impulse Control: Unable to Assess Appetite: Fair (UTA) Weight Loss: 0 Weight Gain: 0 Sleep: Unable to Assess Total Hours of Sleep: 5 Vegetative Symptoms: None  ADLScreening [facility] Assessment Services) Patient's cognitive ability adequate to safely complete daily activities?: Yes Patient able to express need for assistance with ADLs?: Yes Independently performs ADLs?: Yes (appropriate for developmental age)  Prior Inpatient Therapy Prior Inpatient Therapy: Yes Prior Therapy Dates: 2015 Prior Therapy Facilty/Provider(s): [facility] Reason for Treatment: psychoissi  Prior Outpatient Therapy Prior Outpatient Therapy: No Prior Therapy Dates: NA Prior Therapy Facilty/Provider(s): NA Reason for Treatment: NAn Does patient have an ACCT  team?: No Does patient have Intensive In-House Services?  : No Does patient have [facility] services? : No Does patient have P4CC services?: No  ADL Screening (condition at time of admission) Patient's cognitive ability adequate to safely complete daily activities?: Yes Is the patient deaf or have difficulty hearing?: No Does the patient have difficulty seeing, even when wearing  glasses/contacts?: No Does the patient have difficulty concentrating, remembering, or making decisions?: No Patient able to express need for assistance with ADLs?: Yes Does the patient have difficulty dressing or bathing?: No Independently performs ADLs?: Yes (appropriate for developmental age) Does the patient have difficulty walking or climbing stairs?: No Weakness of Legs: None Weakness of Arms/Hands: None       Abuse/Neglect Assessment (Assessment to be complete while patient is alone) Physical Abuse: Denies (UTA) Verbal Abuse: Denies (UTA) Sexual Abuse: Denies (UTA) Exploitation of patient/patient's resources: Denies Industrial/product designer) Self-Neglect: Denies Industrial/product designer)     Merchant navy officer (For Healthcare) Does patient have an advance directive?: No Would patient like information on creating an advanced directive?: No - patient declined information    Additional Information 1:1 In Past 12 Months?: No CIRT Risk: No Elopement Risk: No Does patient have medical clearance?: No     Disposition:  Disposition Initial Assessment Completed for this Encounter: Yes Disposition of Patient: Other dispositions (Pending am psych evaluatuion) Type of outpatient treatment: Adult (Provided with ooutpaitnen) Other disposition(s): Other (Comment)  Nary Sneed D 06/15/2015 1:32 PM

## 2015-06-15 NOTE — ED Notes (Signed)
Wife cell [phone]. Daughter cell [phone]

## 2015-06-15 NOTE — ED Notes (Signed)
Patient's wife is Darlyne Russianina Das. [phone].

## 2015-06-15 NOTE — ED Notes (Signed)
One bag pt belongings containing clothing placed in locker 29.

## 2015-06-15 NOTE — ED Notes (Signed)
Patient has been seen frequently for some mental health issues. According to EMS, per patient's wife his doctor has stopped his pain medications and xanax. He was recently started on cogentin. Patient has been agitated and having auditory hallucinations. Wife called EMS because patient is in between doctors according to spouse.

## 2015-06-15 NOTE — ED Notes (Signed)
Sitter at bedside.

## 2015-06-15 NOTE — ED Notes (Signed)
Patient is asleep and will be released from restraints at this time. Order still in place if patient behavior changes.

## 2015-06-15 NOTE — ED Notes (Addendum)
Patient's wife called voicing her concerns about the patient's hallucinations. She states he was up all night yelling, screaming, and trying to pull out his teeth. She is home with their young grandchildren and did not want to bring them to the hospital. The patient is still picking at something in his mouth.

## 2015-06-15 NOTE — ED Notes (Signed)
Patient unable to void. He is now confused and having visual hallucinations. He thinks that he grandson is in his room with the dog and he keeps jumping out of bed looking for a card that tells him to say "slap the bitch". He states he has said it three times as he was asked. Patient is still conversing with people that he thinks is in the room and that he is at the [employer].

## 2015-06-15 NOTE — ED Notes (Addendum)
Patient states that he does hear voices, but the are not telling him to do anything. He states he hears numbers. Patient is currently talking to himself. The numbers are adding themselves in his head.

## 2015-06-15 NOTE — ED Notes (Signed)
Patient complains of extreme headache and that his grandchildren have played a trick on him by putting sulfur on his teeth. He is motioning as if trying to pull his teeth and talking to himself.

## 2015-06-15 NOTE — ED Notes (Signed)
Opi from the [employer] is in the ED according to the patient. Patient states, " if you see him running around, grab him. He's the one putting stuff in people's coffee".
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
## 2015-06-16 DIAGNOSIS — G2401 Drug induced subacute dyskinesia: Secondary | ICD-10-CM | POA: Diagnosis present

## 2015-06-16 DIAGNOSIS — F419 Anxiety disorder, unspecified: Secondary | ICD-10-CM | POA: Diagnosis present

## 2015-06-16 DIAGNOSIS — F4310 Post-traumatic stress disorder, unspecified: Secondary | ICD-10-CM

## 2015-06-16 MED ORDER — PROPRANOLOL HCL 20 MG PO TABS
20.0000 mg | ORAL_TABLET | Freq: Two times a day (BID) | ORAL | Status: DC
  Administered 2015-06-16 – 2015-06-17 (×2): 20 mg via ORAL
  Filled 2015-06-16 (×5): qty 1

## 2015-06-16 MED ORDER — OLANZAPINE 5 MG PO TABS: 5.0000 mg | ORAL_TABLET | Freq: Every day | ORAL | Status: DC

## 2015-06-16 NOTE — BHH Counselor (Signed)
[facility]

## 2015-06-16 NOTE — BH Assessment (Signed)
[facility] Assessment Progress Note  The following facilities have been contacted to seek placement for this pt, with results as noted:  Beds available, information sent, decision pending:  [facility]   [facility]:  [facility]  Pt declined:  [facility] (no high acuity beds on 7/1) [facility] (reason unspecified) [facility] (required restraints) [facility] (aggression) [facility] (acuity)    Doylene Canninghomas Alitza Cowman, MA Triage Specialist [phone]

## 2015-06-16 NOTE — ED Notes (Signed)
Patient brought back to [facility] from TCU.  Ambulated without difficulty.  Came in and started eating his lunch.  He has been polite and cooperative since arriving here.  He is currently resting quietly on his bed.  He denies thoughts of harm to self or others.  States he is a bit foggy as to the events which led up to his hospitalization.

## 2015-06-16 NOTE — ED Notes (Signed)
MD at bedside. 

## 2015-06-16 NOTE — ED Notes (Signed)
[facility] Expected date:  Expected time:  Means of arrival:  Comments: For TCU room 29
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
## 2015-06-17 DIAGNOSIS — F4310 Post-traumatic stress disorder, unspecified: Secondary | ICD-10-CM | POA: Diagnosis not present

## 2015-06-17 NOTE — BHH Counselor (Signed)
[facility] reports that pt needs to be under IVC prior to transport. [place] Magistrate confirmed receipt of IVC paperwork. Writer spoke w/ Joseph Curtis, Joseph Curtis re: [facility]. Joseph Curtis reports he will drive to Magistrate's office and pick up paperwork, then [facility] Writer notified pt's RN and DNP.  Joseph Cristalaroline Paige Reola Buckles, [place] Therapeutic Triage Specialist

## 2015-06-17 NOTE — Progress Notes (Signed)
D Pt. Denies SI and HI, no complaints of pain or discomfort noted.  [facility] after he had slept most of the day.  A  Writer offered support and encouragement,  As well as medication for his anxiety  R Pt. Remains safe on the unit.

## 2015-06-17 NOTE — ED Notes (Signed)
Patient discharged, ambulatory, with Sheriff. All belongings given to sheriff.
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
## 2015-06-17 NOTE — BH Assessment (Signed)
Spoke with Joseph Curtis at [facility] who reported that pt has been accepted to the services of [name]. Nurse to nurse report can be called to [phone]. Pt can be transported after 11 am.

## 2015-06-17 NOTE — ED Notes (Signed)
Pt alert and oriented in no distress.  Denies SI, HI, AVH, and pain.  Patient observed resting in bed.  He is currently visiting with his wife.  No complaints.  Will continue to monitor for safety. Pt instructed to come to me with problems or concerns. Q15 minutes checks continue.

## 2018-07-10 DIAGNOSIS — Z79899 Other long term (current) drug therapy: Secondary | ICD-10-CM | POA: Diagnosis not present

## 2018-09-23 DIAGNOSIS — I10 Essential (primary) hypertension: Secondary | ICD-10-CM | POA: Diagnosis not present

## 2018-09-23 DIAGNOSIS — R569 Unspecified convulsions: Secondary | ICD-10-CM | POA: Diagnosis not present

## 2019-01-25 DIAGNOSIS — K4031 Unilateral inguinal hernia, with obstruction, without gangrene, recurrent: Secondary | ICD-10-CM | POA: Diagnosis present

## 2019-03-08 DIAGNOSIS — E785 Hyperlipidemia, unspecified: Secondary | ICD-10-CM | POA: Diagnosis not present

## 2019-03-08 DIAGNOSIS — Z6822 Body mass index (BMI) 22.0-22.9, adult: Secondary | ICD-10-CM | POA: Diagnosis not present

## 2019-03-08 DIAGNOSIS — K4090 Unilateral inguinal hernia, without obstruction or gangrene, not specified as recurrent: Secondary | ICD-10-CM | POA: Diagnosis not present

## 2019-03-08 DIAGNOSIS — I10 Essential (primary) hypertension: Secondary | ICD-10-CM | POA: Diagnosis not present

## 2019-03-30 DIAGNOSIS — Z8619 Personal history of other infectious and parasitic diseases: Secondary | ICD-10-CM | POA: Diagnosis not present

## 2019-03-30 DIAGNOSIS — E785 Hyperlipidemia, unspecified: Secondary | ICD-10-CM | POA: Diagnosis not present

## 2019-03-30 DIAGNOSIS — Z79899 Other long term (current) drug therapy: Secondary | ICD-10-CM | POA: Diagnosis not present

## 2019-03-30 DIAGNOSIS — I10 Essential (primary) hypertension: Secondary | ICD-10-CM | POA: Diagnosis not present

## 2019-06-09 ENCOUNTER — Encounter (HOSPITAL_COMMUNITY): Payer: Self-pay | Admitting: Emergency Medicine

## 2019-06-09 ENCOUNTER — Emergency Department (HOSPITAL_COMMUNITY)
Admission: EM | Admit: 2019-06-09 | Discharge: 2019-06-10 | Disposition: A | Discharge status: Home / Self Care | Payer: Medicare Other | Attending: Emergency Medicine | Admitting: Emergency Medicine

## 2019-06-09 ENCOUNTER — Other Ambulatory Visit: Payer: Self-pay

## 2019-06-09 DIAGNOSIS — R4781 Slurred speech: Secondary | ICD-10-CM | POA: Diagnosis not present

## 2019-06-09 DIAGNOSIS — F332 Major depressive disorder, recurrent severe without psychotic features: Secondary | ICD-10-CM | POA: Diagnosis not present

## 2019-06-09 DIAGNOSIS — R531 Weakness: Secondary | ICD-10-CM

## 2019-06-09 DIAGNOSIS — F17210 Nicotine dependence, cigarettes, uncomplicated: Secondary | ICD-10-CM | POA: Insufficient documentation

## 2019-06-09 DIAGNOSIS — Z79899 Other long term (current) drug therapy: Secondary | ICD-10-CM | POA: Diagnosis not present

## 2019-06-09 DIAGNOSIS — I10 Essential (primary) hypertension: Secondary | ICD-10-CM | POA: Diagnosis not present

## 2019-06-09 DIAGNOSIS — F1910 Other psychoactive substance abuse, uncomplicated: Secondary | ICD-10-CM | POA: Diagnosis not present

## 2019-06-09 DIAGNOSIS — F4310 Post-traumatic stress disorder, unspecified: Secondary | ICD-10-CM | POA: Insufficient documentation

## 2019-06-09 MED ORDER — SODIUM CHLORIDE 0.9% FLUSH: 3.0000 mL | Freq: Once | INTRAVENOUS | Status: DC

## 2019-06-09 NOTE — ED Triage Notes (Signed)
Pt c/o generalized weakness x 1 week. Pt reports that he has removed multiple ticks from his body in the last week. Denies shortness of breath/chest pain/cough/fever.

## 2019-06-10 ENCOUNTER — Emergency Department (HOSPITAL_COMMUNITY): Payer: Medicare Other

## 2019-06-10 ENCOUNTER — Other Ambulatory Visit: Payer: Self-pay

## 2019-06-10 DIAGNOSIS — R4781 Slurred speech: Secondary | ICD-10-CM | POA: Diagnosis not present

## 2019-06-10 DIAGNOSIS — R531 Weakness: Secondary | ICD-10-CM | POA: Diagnosis not present

## 2019-06-10 LAB — BASIC METABOLIC PANEL
Anion gap: 9 (ref 5–15)
BUN: 20 mg/dL (ref 6–20)
CO2: 19 mmol/L — ABNORMAL LOW (ref 22–32)
Calcium: 9 mg/dL (ref 8.9–10.3)
Chloride: 106 mmol/L (ref 98–111)
Creatinine, Ser: 1.23 mg/dL (ref 0.61–1.24)
GFR calc Af Amer: >60 mL/min (ref >60)
GFR calc non Af Amer: >60 mL/min (ref >60)
Glucose, Bld: 105 mg/dL — ABNORMAL HIGH (ref 70–99)
Potassium: 3.5 mmol/L (ref 3.5–5.1)
Sodium: 134 mmol/L — ABNORMAL LOW (ref 135–145)

## 2019-06-10 LAB — CBC
HCT: 45.2 % (ref 39.0–52.0)
Hemoglobin: 15.6 g/dL (ref 13.0–17.0)
MCH: 31.5 pg (ref 26.0–34.0)
MCHC: 34.5 g/dL (ref 30.0–36.0)
MCV: 91.3 fL (ref 80.0–100.0)
Platelets: 315 10*3/uL (ref 150–400)
RBC: 4.95 MIL/uL (ref 4.22–5.81)
RDW: 13.1 % (ref 11.5–15.5)
WBC: 9.4 10*3/uL (ref 4.0–10.5)
nRBC: 0 % (ref 0.0–0.2)

## 2019-06-10 LAB — URINALYSIS, ROUTINE W REFLEX MICROSCOPIC
Bilirubin Urine: NEGATIVE
Glucose, UA: NEGATIVE mg/dL
Hgb urine dipstick: NEGATIVE
Ketones, ur: NEGATIVE mg/dL
Leukocytes,Ua: NEGATIVE
Nitrite: NEGATIVE
Protein, ur: 30 mg/dL — AB
Specific Gravity, Urine: 1.017 (ref 1.005–1.030)
pH: 5 (ref 5.0–8.0)

## 2019-06-10 LAB — RAPID URINE DRUG SCREEN, HOSP PERFORMED
Amphetamines: POSITIVE — AB
Barbiturates: NOT DETECTED
Benzodiazepines: POSITIVE — AB
Cocaine: POSITIVE — AB
Opiates: NOT DETECTED
Tetrahydrocannabinol: POSITIVE — AB

## 2019-06-10 MED ORDER — LOSARTAN POTASSIUM 50 MG PO TABS: 100.0000 mg | ORAL_TABLET | Freq: Every day | ORAL | Status: DC

## 2019-06-10 MED ORDER — LOSARTAN POTASSIUM 50 MG PO TABS: 50.0000 mg | ORAL_TABLET | Freq: Every day | ORAL | 1 refills | Status: DC

## 2019-06-10 NOTE — ED Notes (Signed)
Pt pushed the call light and said he was going to have a panic attack. Asking for xanex.

## 2019-06-10 NOTE — ED Provider Notes (Signed)
[facility] EMERGENCY DEPARTMENT Provider Note   CSN: [id number] Arrival date & time: 06/09/19  2317    History   Chief Complaint Chief Complaint  Patient presents with   Weakness    HPI Joseph Curtis is a 58 y.o. male.     58 year old with hx of depression, HTN presents to the ED for evaluation of generalized weakness. States that he has noticed these symptoms over the past 3-4 days. Was writing a message to his wife on the white board and it took him "two hours to complete the sentence". Reports wife has noticed some issues with fluidity of speech over the past few months.  Just had Prozac added to his psychiatric medications within the last month; also on Abilify, Remeron, Latuda, Gabapentin, Xanax.  Has had some anorexia and nausea since these medication changes.  Denies use of illicit drugs, specifically IVDU.  No fever, cough, CP, SOB, vomiting, headache.  Does endorse pulling a few ticks off of him in the past 48 hours.    Noted to be hypertensive in triage.  Was previously on BP medications, but has not been prescribed this since a "falling out" with his PCP.  The history is provided by the patient. No language interpreter was used.  Weakness   Past Medical History:  Diagnosis Date   Anxiety    Back pain    chronic   Depression    Fall    from construction site   Hypertension    Neck pain    chronic   Panic attacks     Patient Active Problem List   Diagnosis Date Noted   Anxiety 06/16/2015   Posttraumatic stress disorder 06/16/2015   Tardive dyskinesia 06/16/2015   Chronic pain syndrome 11/30/2014   Legg-Calve-Perthes disease 11/30/2014   Panic disorder 11/30/2014    History reviewed. No pertinent surgical history.     Home Medications    Prior to Admission medications   Medication Sig Start Date End Date Taking? Authorizing Provider  ALPRAZolam Duanne Moron) 1 MG tablet Take 1 mg by mouth 3 (three) times daily as needed  for anxiety.     [provider]  amphetamine-dextroamphetamine (ADDERALL XR) 20 MG 24 hr capsule Take 20 mg by mouth daily.  04/26/15   [provider]  benztropine (COGENTIN) 1 MG tablet Take 1 tablet (1 mg total) by mouth 2 (two) times daily. Patient not taking: Reported on 06/15/2015 06/06/15   Al Corpus, PA-C  citalopram (CELEXA) 20 MG tablet Take 20 mg by mouth daily. 06/06/15   [provider]  divalproex (DEPAKOTE) 250 MG DR tablet Take 250 mg by mouth 2 (two) times daily. Instructions: Take along with 500 mg dose bid. 05/02/15   [provider]  divalproex (DEPAKOTE) 500 MG DR tablet Take 500 mg by mouth 2 (two) times daily. Instructions: Take along with 250 mg dose bid. 06/06/15   [provider]  furosemide (LASIX) 20 MG tablet Take 20 mg by mouth daily. 06/06/15   [provider]  HYDROcodone-acetaminophen (NORCO) 10-325 MG per tablet Take 1 tablet by mouth every 6 (six) hours as needed for moderate pain or severe pain.     [provider]  ibuprofen (ADVIL,MOTRIN) 200 MG tablet Take 800 mg by mouth every 6 (six) hours as needed for moderate pain.     [provider]  loratadine (CLARITIN) 10 MG tablet Take 10 mg by mouth daily. 06/06/15   [provider]  losartan (COZAAR)  50 MG tablet Take 1 tablet (50 mg total) by mouth daily. 06/10/19   Antony MaduraHumes, Bellamia Ferch, PA-C  Melatonin 10 MG CAPS Take 10 mg by mouth at bedtime.    [provider]  propranolol (INDERAL) 40 MG tablet Take 2 tablets (80 mg total) by mouth daily. 06/06/15   Oswaldo Conroyreech, Victoria, PA-C  rizatriptan (MAXALT) 10 MG tablet Take 10 mg by mouth daily as needed for migraine. May repeat in 2 hours if needed    [provider]    Family History No family history on file.  Social History Social History   Tobacco Use   Smoking status: Current Every Day Smoker   Smokeless tobacco: Never Used  Substance Use Topics   Alcohol use: No     Drug use: Not on file     Allergies   Bee venom, Codeine, and Penicillins   Review of Systems Review of Systems  Neurological: Positive for weakness.  Ten systems reviewed and are negative for acute change, except as noted in the HPI.    Physical Exam Updated Vital Signs BP 133/77    Pulse 83    Temp 98.7 F (37.1 C) (Oral)    Resp 16    SpO2 96%   Physical Exam Vitals signs and nursing note reviewed.  Constitutional:      General: He is not in acute distress.    Appearance: He is well-developed. He is not diaphoretic.     Comments: Nontoxic appearing and in NAD  HENT:     Head: Normocephalic and atraumatic.  Eyes:     General: No scleral icterus.    Conjunctiva/sclera: Conjunctivae normal.  Neck:     Musculoskeletal: Normal range of motion.  Cardiovascular:     Rate and Rhythm: Normal rate and regular rhythm.     Pulses: Normal pulses.  Pulmonary:     Effort: Pulmonary effort is normal. No respiratory distress.     Comments: Respirations even and unlabored Musculoskeletal: Normal range of motion.  Skin:    General: Skin is warm and dry.     Coloration: Skin is not pale.     Findings: No erythema or rash.  Neurological:     Mental Status: He is alert and oriented to person, place, and time.     Deep Tendon Reflexes:     Reflex Scores:      Tricep reflexes are 1+ on the right side and 1+ on the left side.      Bicep reflexes are 2+ on the right side and 2+ on the left side.      Patellar reflexes are 2+ on the right side and 2+ on the left side.      Achilles reflexes are 1+ on the right side and 1+ on the left side.    Comments: GCS 15. Speech is goal oriented. No cranial nerve deficits appreciated; symmetric eyebrow raise, no facial drooping, tongue midline. Patient has equal grip strength bilaterally with 5/5 strength against resistance in all major muscle groups bilaterally. Sensation to light touch intact. Patient moves extremities without ataxia. No  pronator drift. No dysmetria to finger-nose-finger.  Psychiatric:        Mood and Affect: Affect is flat.        Behavior: Behavior normal.      ED Treatments / Results  Labs (all labs ordered are listed, but only abnormal results are displayed) Labs Reviewed  BASIC METABOLIC PANEL - Abnormal; Notable for the following components:  Result Value   Sodium 134 (*)    CO2 19 (*)    Glucose, Bld 105 (*)    All other components within normal limits  URINALYSIS, ROUTINE W REFLEX MICROSCOPIC - Abnormal; Notable for the following components:   APPearance CLOUDY (*)    Protein, ur 30 (*)    Bacteria, UA RARE (*)    All other components within normal limits  RAPID URINE DRUG SCREEN, HOSP PERFORMED - Abnormal; Notable for the following components:   Cocaine POSITIVE (*)    Benzodiazepines POSITIVE (*)    Amphetamines POSITIVE (*)    Tetrahydrocannabinol POSITIVE (*)    All other components within normal limits  CBC  B. BURGDORFI ANTIBODIES  ROCKY MTN SPOTTED FVR ABS PNL(IGG+IGM)    EKG EKG Interpretation  Date/Time:  Wednesday June 09 2019 23:47:26 EDT Ventricular Rate:  93 PR Interval:  136 QRS Duration: 86 QT Interval:  380 QTC Calculation: 472 R Axis:   62 Text Interpretation:  Normal sinus rhythm Normal ECG Confirmed by Ross MarcusHorton, Courtney ([id number]) on 06/10/2019 4:29:05 AM   Radiology Ct Head Wo Contrast  Result Date: 06/10/2019 CLINICAL DATA:  Weakness and speech difficulty for 1 week. EXAM: CT HEAD WITHOUT CONTRAST TECHNIQUE: Contiguous axial images were obtained from the base of the skull through the vertex without intravenous contrast. COMPARISON:  None. FINDINGS: Brain: There is no mass, hemorrhage or extra-axial collection. The size and configuration of the ventricles and extra-axial CSF spaces are normal. The brain parenchyma is normal, without acute or chronic infarction. Vascular: No abnormal hyperdensity of the major intracranial arteries or dural venous sinuses. No  intracranial atherosclerosis. Skull: The visualized skull base, calvarium and extracranial soft tissues are normal. Sinuses/Orbits: No fluid levels or advanced mucosal thickening of the visualized paranasal sinuses. No mastoid or middle ear effusion. The orbits are normal. IMPRESSION: Normal head CT. Electronically Signed   By: Deatra RobinsonKevin  Herman M.D.   On: 06/10/2019 02:58    Procedures Procedures (including critical care time)  Medications Ordered in ED Medications  sodium chloride flush (NS) 0.9 % injection 3 mL (3 mLs Intravenous Not Given 06/10/19 0149)     Initial Impression / Assessment and Plan / ED Course  I have reviewed the triage vital signs and the nursing notes.  Pertinent labs & imaging results that were available during my care of the patient were reviewed by me and considered in my medical decision making (see chart for details).       [id number] AM 58 year old male with a history of hypertension and psychiatric illness presents to the ED for evaluation of generalized weakness.  His symptoms are fairly vague.  Reports onset about 3 to 4 days ago.  Specifically references difficulty writing on an array stable white board today.  Did have Prozac added to his psychiatric regimen approximately 1 month ago.  Also endorsing some recent tick bites. Overall, labs drawn in triage have been reassuring. Will add blood tests for tick-borne illness, obtain head CT. Neurologic exam today is nonfocal. Patient is afebrile with stable vital signs.  4:06 AM Patient reassessed.  He has had multiple packages of both saltines and graham crackers.  Has drank for cans of Coca-Cola.  Communicated results of CT scan which are reassuring.  Patient able to hold a pen and write "[place]" without much issue. Can also pick a tab from a Coca-Cola can off the flat surface of his bedside table.  Case discussed also with my attending, MD Horton.  Plan  for consultation to neurology to help determine  disposition.  4:12 AM Case discussed with Dr. Amada JupiterKirkpatrick.  If patient has normal reflexes and no concern for C-spine etiology, he suspects that symptoms may be secondary to his Abilify.  Would recommend the patient hold this medication to see if his symptoms resolve.  4:28 AM Reflexes equal and reassuring throughout. Exam and history not presently concerning for any type of central cord process in the C-spine. UDS added.  5:23 AM Spoke with the patient's wife, Darlyne Russianina Mcenroe, who expresses concern for discharge without psychiatric evaluation. Believes that some of these behavior changes are due to his psych meds and she is too frail to help the patient at home without these issues being stabilized.  Expressed to the patient's wife that his work-up in the emergency department has been reassuring.  I did tell her that I would inquire with the patient about his desire to speak with psychiatry here.  Patient is open to TTS evaluation at this time.  Consult placed.  He denies any suicidal or homicidal thoughts at this time.  If cleared by TTS, will proceed with plan for d/c and outpatient psych follow up.  6:22 AM UDS positive for cocaine, amphetamines, and marijuana. Patient is prescribed Xanax, which is likely the reason UDS also shows benzodiazepines. Pending TTS assessment.  7:00 AM [facility] out to [facility] Khatri, PA-C at shift change pending TTS eval.   Final Clinical Impressions(s) / ED Diagnoses   Final diagnoses:  Generalized weakness  Hypertension not at goal  Polysubstance abuse [facility](HCC)    ED Discharge Orders         Ordered    losartan (COZAAR) 50 MG tablet  Daily     06/10/19 0445           Antony MaduraHumes, Manasseh Pittsley, PA-C 06/10/19 0700    Horton, Mayer Maskerourtney F, MD 06/14/19 [phone]

## 2019-06-10 NOTE — Discharge Instructions (Addendum)
We advise you discontinue use of illicit drugs as your drug screen was positive for amphetamines, cocaine, and marijuana. Your evaluation in the emergency department tonight was otherwise reassuring. Blood was drawn to test for tick-borne illness. You will be notified if these return positive.  We believe that your symptoms may be due to your change in psychiatric medications.  Follow-up with a primary care doctor and/or your psychiatrist to discuss your symptoms.  Your blood pressure was noted to be high in the ED.  You do have a history of high blood pressure.  A prescription for Losartan has been sent to your pharmacy.  You should restart this medication and take as prescribed.  Have your blood pressure rechecked in 1 week by a primary doctor.  You may return to the ED for new or concerning symptoms.

## 2019-06-10 NOTE — ED Notes (Signed)
Assisted pt with calling family. 

## 2019-06-10 NOTE — ED Triage Notes (Signed)
Attempted to  To call TTS at [phone] no answer.

## 2019-06-10 NOTE — ED Notes (Signed)
Gerber pts wife please call
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
## 2019-06-10 NOTE — Progress Notes (Signed)
Patient is psych cleared per [place], Mordecai Maes, NP.  CSW notified Luellen Pucker, RN in the [facility] Psych ED.  Areatha Keas. Judi Cong, MSW, [place] Disposition Clinical Social Work [phone] (cell) [phone] (office)

## 2019-06-11 LAB — B. BURGDORFI ANTIBODIES: B burgdorferi Ab IgG+IgM: <0.91 {ISR} (ref 0.00–0.90)

## 2019-06-12 ENCOUNTER — Encounter (HOSPITAL_COMMUNITY): Payer: Self-pay | Admitting: Emergency Medicine

## 2019-06-12 ENCOUNTER — Other Ambulatory Visit: Payer: Self-pay

## 2019-06-12 ENCOUNTER — Ambulatory Visit (HOSPITAL_COMMUNITY): Payer: Self-pay

## 2019-06-12 ENCOUNTER — Emergency Department (HOSPITAL_COMMUNITY)
Admission: EM | Admit: 2019-06-12 | Discharge: 2019-06-13 | Disposition: A | Discharge status: Rehabilitation Facility | Payer: Medicare Other | Attending: Emergency Medicine | Admitting: Emergency Medicine

## 2019-06-12 DIAGNOSIS — I10 Essential (primary) hypertension: Secondary | ICD-10-CM | POA: Diagnosis not present

## 2019-06-12 DIAGNOSIS — Z03818 Encounter for observation for suspected exposure to other biological agents ruled out: Secondary | ICD-10-CM | POA: Diagnosis not present

## 2019-06-12 DIAGNOSIS — F4310 Post-traumatic stress disorder, unspecified: Secondary | ICD-10-CM | POA: Insufficient documentation

## 2019-06-12 DIAGNOSIS — F17200 Nicotine dependence, unspecified, uncomplicated: Secondary | ICD-10-CM | POA: Diagnosis not present

## 2019-06-12 DIAGNOSIS — F332 Major depressive disorder, recurrent severe without psychotic features: Secondary | ICD-10-CM | POA: Insufficient documentation

## 2019-06-12 DIAGNOSIS — F1910 Other psychoactive substance abuse, uncomplicated: Secondary | ICD-10-CM | POA: Diagnosis not present

## 2019-06-12 DIAGNOSIS — R531 Weakness: Secondary | ICD-10-CM | POA: Diagnosis present

## 2019-06-12 DIAGNOSIS — Z79899 Other long term (current) drug therapy: Secondary | ICD-10-CM | POA: Insufficient documentation

## 2019-06-12 LAB — CBC WITH DIFFERENTIAL/PLATELET
Abs Immature Granulocytes: 0.04 10*3/uL (ref 0.00–0.07)
Basophils Absolute: 0.1 10*3/uL (ref 0.0–0.1)
Basophils Relative: 1 %
Eosinophils Absolute: 1.2 10*3/uL — ABNORMAL HIGH (ref 0.0–0.5)
Eosinophils Relative: 11 %
HCT: 47.2 % (ref 39.0–52.0)
Hemoglobin: 15.4 g/dL (ref 13.0–17.0)
Immature Granulocytes: 0 %
Lymphocytes Relative: 25 %
Lymphs Abs: 2.9 10*3/uL (ref 0.7–4.0)
MCH: 30.7 pg (ref 26.0–34.0)
MCHC: 32.6 g/dL (ref 30.0–36.0)
MCV: 94 fL (ref 80.0–100.0)
Monocytes Absolute: 1.1 10*3/uL — ABNORMAL HIGH (ref 0.1–1.0)
Monocytes Relative: 9 %
Neutro Abs: 6.2 10*3/uL (ref 1.7–7.7)
Neutrophils Relative %: 54 %
Platelets: 324 10*3/uL (ref 150–400)
RBC: 5.02 MIL/uL (ref 4.22–5.81)
RDW: 12.8 % (ref 11.5–15.5)
WBC: 11.6 10*3/uL — ABNORMAL HIGH (ref 4.0–10.5)
nRBC: 0 % (ref 0.0–0.2)

## 2019-06-12 LAB — COMPREHENSIVE METABOLIC PANEL
ALT: 20 U/L (ref 0–44)
AST: 23 U/L (ref 15–41)
Albumin: 4.4 g/dL (ref 3.5–5.0)
Alkaline Phosphatase: 83 U/L (ref 38–126)
Anion gap: 9 (ref 5–15)
BUN: 17 mg/dL (ref 6–20)
CO2: 25 mmol/L (ref 22–32)
Calcium: 9.4 mg/dL (ref 8.9–10.3)
Chloride: 104 mmol/L (ref 98–111)
Creatinine, Ser: 0.89 mg/dL (ref 0.61–1.24)
GFR calc Af Amer: >60 mL/min (ref >60)
GFR calc non Af Amer: >60 mL/min (ref >60)
Glucose, Bld: 83 mg/dL (ref 70–99)
Potassium: 4.1 mmol/L (ref 3.5–5.1)
Sodium: 138 mmol/L (ref 135–145)
Total Bilirubin: <0.1 mg/dL — ABNORMAL LOW (ref 0.3–1.2)
Total Protein: 7.6 g/dL (ref 6.5–8.1)

## 2019-06-12 LAB — RAPID URINE DRUG SCREEN, HOSP PERFORMED
Amphetamines: NOT DETECTED
Barbiturates: NOT DETECTED
Benzodiazepines: POSITIVE — AB
Cocaine: POSITIVE — AB
Opiates: NOT DETECTED
Tetrahydrocannabinol: POSITIVE — AB

## 2019-06-12 LAB — SARS CORONAVIRUS 2 BY RT PCR (HOSPITAL ORDER, PERFORMED IN ~~LOC~~ HOSPITAL LAB): SARS Coronavirus 2: NEGATIVE

## 2019-06-12 LAB — ETHANOL: Alcohol, Ethyl (B): <10 mg/dL (ref <10)

## 2019-06-12 LAB — ROCKY MTN SPOTTED FVR ABS PNL(IGG+IGM)
RMSF IgG: NEGATIVE
RMSF IgM: 0.34 index (ref 0.00–0.89)

## 2019-06-12 MED ORDER — LORAZEPAM 1 MG PO TABS
1.0000 mg | ORAL_TABLET | ORAL | Status: AC | PRN
  Administered 2019-06-12: 1 mg via ORAL
  Filled 2019-06-12: qty 1

## 2019-06-12 MED ORDER — ZIPRASIDONE MESYLATE 20 MG IM SOLR: 20.0000 mg | INTRAMUSCULAR | Status: DC | PRN

## 2019-06-12 MED ORDER — RISPERIDONE 1 MG PO TBDP
2.0000 mg | ORAL_TABLET | Freq: Three times a day (TID) | ORAL | Status: DC | PRN
  Administered 2019-06-12: 1 mg via ORAL
  Filled 2019-06-12: qty 2

## 2019-06-12 MED ORDER — ACETAMINOPHEN 325 MG PO TABS: 650.0000 mg | ORAL_TABLET | ORAL | Status: DC | PRN

## 2019-06-12 NOTE — Patient Outreach (Signed)
CPSS met with the patient in order to provide substance use recovery support and help with getting connected to substance use treatment resources. Patient states that he does not believe his substance use is unmanageable at this time or needs help with getting connected to substance use recovery resources. Patient reports smoking marijuana. Patient states that he used methamphetamines for the first time the other day to help improve weakness related to the patient's motor skills. Patient states he did not like the way this methamphetamine use made him feel and plans to never misuse this substance ever again. CPSS still provided  information for substance use recovery resources including residential/outpatient substance use treatment center list, NA meeting list for the [place] area, and CPSS contact information. CPSS strongly encouraged the patient to follow up with CPSS if needed for further help with getting connected to substance use recovery resources within the patient's community setting. Patient was pleasant, cooperative, and expressed gratitude to CPSS for the CPSS services provided.

## 2019-06-12 NOTE — ED Notes (Signed)
Pulse Ox while Ambulating: 100%

## 2019-06-12 NOTE — ED Notes (Signed)
Patient has been provided hot meal. 
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
## 2019-06-12 NOTE — ED Notes (Addendum)
Patient was brought to ED by spouse via POV per triage EMT.

## 2019-06-12 NOTE — ED Triage Notes (Signed)
Patient reports multiple tick bites x "a few days ago." Bites to left thigh, left shoulder, and left leg. States decreased motor skills and altered mental status since the bites. Also states "I am suicidal because why would I want to live like this." Hx major depressive and PTSD.

## 2019-06-12 NOTE — ED Provider Notes (Signed)
[facility]-EMERGENCY DEPT Provider Note   CSN: [id number] Arrival date & time: 06/12/19  1327    History   Chief Complaint Chief Complaint  Patient presents with   Insect Bite    HPI Joseph Curtis is a 58 y.o. male.     HPI  58 year old male comes in a chief complaint of insect bite, weakness and SI.  Patient was seen in the ER for similar complaints few days ago.  At that time he was checked for [facility] fever and Lyme disease, results for both have been negative.  According to the patient he was bit by a tick few days ago and since then he has been having some difficulty with ability to function.  He has noted that he just feels out of it -the example he uses is that he knows he wants to get up and open the door, but he does not have the strength to open the door.  He knows he is hungry, however he sometimes does not have the strength to chew.  He admits to multiple substances used as a way of self treatment over the past few days.  He is also having SI because of his new normal of not being as active and functional.  Patient denies any headaches, neck pain, chest pain, fevers, chills, nausea, vomiting.  Past Medical History:  Diagnosis Date   Anxiety    Back pain    chronic   Depression    Fall    from construction site   Hypertension    Neck pain    chronic   Panic attacks     Patient Active Problem List   Diagnosis Date Noted   Anxiety 06/16/2015   Posttraumatic stress disorder 06/16/2015   Tardive dyskinesia 06/16/2015   Chronic pain syndrome 11/30/2014   Legg-Calve-Perthes disease 11/30/2014   Panic disorder 11/30/2014    History reviewed. No pertinent surgical history.      Home Medications    Prior to Admission medications   Medication Sig Start Date End Date Taking? Authorizing Provider  ALPRAZolam Prudy Feeler(XANAX) 1 MG tablet Take 1 mg by mouth 3 (three) times daily as needed for anxiety.     [provider]  amphetamine-dextroamphetamine (ADDERALL XR) 20 MG 24 hr capsule Take 20 mg by mouth daily.  04/26/15   [provider]  benztropine (COGENTIN) 1 MG tablet Take 1 tablet (1 mg total) by mouth 2 (two) times daily. Patient not taking: Reported on 06/15/2015 06/06/15   Oswaldo Conroyreech, Victoria, PA-C  citalopram (CELEXA) 20 MG tablet Take 20 mg by mouth daily. 06/06/15   [provider]  divalproex (DEPAKOTE) 250 MG DR tablet Take 250 mg by mouth 2 (two) times daily. Instructions: Take along with 500 mg dose bid. 05/02/15   [provider]  divalproex (DEPAKOTE) 500 MG DR tablet Take 500 mg by mouth 2 (two) times daily. Instructions: Take along with 250 mg dose bid. 06/06/15   [provider]  furosemide (LASIX) 20 MG tablet Take 20 mg by mouth daily. 06/06/15   [provider]  HYDROcodone-acetaminophen (NORCO) 10-325 MG per tablet Take 1 tablet by mouth every 6 (six) hours as needed for moderate pain or severe pain.     [provider]  ibuprofen (ADVIL,MOTRIN) 200 MG tablet Take 800 mg by mouth every 6 (six) hours as needed for moderate pain.     [provider]  loratadine (CLARITIN) 10 MG tablet Take 10 mg  by mouth daily. 06/06/15   [provider]  losartan (COZAAR) 50 MG tablet Take 1 tablet (50 mg total) by mouth daily. 06/10/19   Antonietta Breach, PA-C  Melatonin 10 MG CAPS Take 10 mg by mouth at bedtime.    [provider]  propranolol (INDERAL) 40 MG tablet Take 2 tablets (80 mg total) by mouth daily. 06/06/15   Al Corpus, PA-C  rizatriptan (MAXALT) 10 MG tablet Take 10 mg by mouth daily as needed for migraine. May repeat in 2 hours if needed    [provider]    Family History No family history on file.  Social History Social History   Tobacco Use   Smoking status: Current Every Day Smoker   Smokeless tobacco: Never Used  Substance Use Topics   Alcohol use: No   Drug use: Not on file      Allergies   Bee venom, Codeine, and Penicillins   Review of Systems Review of Systems  Constitutional: Positive for activity change.  Cardiovascular: Negative for chest pain.  Gastrointestinal: Negative for abdominal pain.  Neurological: Positive for weakness. Negative for numbness.  Psychiatric/Behavioral: Positive for suicidal ideas.  All other systems reviewed and are negative.    Physical Exam Updated Vital Signs BP 117/74    Pulse 66    Temp 97.6 F (36.4 C) (Oral)    Resp 19    SpO2 97%   Physical Exam Vitals signs and nursing note reviewed.  Constitutional:      Appearance: He is well-developed.  HENT:     Head: Atraumatic.  Neck:     Musculoskeletal: Neck supple.  Cardiovascular:     Rate and Rhythm: Normal rate.  Pulmonary:     Effort: Pulmonary effort is normal.  Skin:    General: Skin is warm.     Comments: 2 mm diameter scab type lesions that are circular in nature noted over the left lower extremity.  Neurological:     Mental Status: He is alert and oriented to person, place, and time.     Cranial Nerves: No cranial nerve deficit.     Sensory: No sensory deficit.     Motor: No weakness.     Gait: Gait normal.     Deep Tendon Reflexes: Reflexes normal.      ED Treatments / Results  Labs (all labs ordered are listed, but only abnormal results are displayed) Labs Reviewed  COMPREHENSIVE METABOLIC PANEL - Abnormal; Notable for the following components:      Result Value   Total Bilirubin <0.1 (*)    All other components within normal limits  RAPID URINE DRUG SCREEN, HOSP PERFORMED - Abnormal; Notable for the following components:   Cocaine POSITIVE (*)    Benzodiazepines POSITIVE (*)    Tetrahydrocannabinol POSITIVE (*)    All other components within normal limits  CBC WITH DIFFERENTIAL/PLATELET - Abnormal; Notable for the following components:   WBC 11.6 (*)    Monocytes Absolute 1.1 (*)    Eosinophils Absolute 1.2 (*)    All other  components within normal limits  ETHANOL    EKG None  Radiology No results found.  Procedures Procedures (including critical care time)  Medications Ordered in ED Medications  risperiDONE (RISPERDAL M-TABS) disintegrating tablet 2 mg (has no administration in time range)    And  LORazepam (ATIVAN) tablet 1 mg (has no administration in time range)    And  ziprasidone (GEODON) injection 20 mg (has no administration in time range)  acetaminophen (TYLENOL) tablet 650 mg (has no administration in time range)     Initial Impression / Assessment and Plan / ED Course  I have reviewed the triage vital signs and the nursing notes.  Pertinent labs & imaging results that were available during my care of the patient were reviewed by me and considered in my medical decision making (see chart for details).        58 year old male comes in a chief complaint of weakness, SI.  He also reports that he was bit by a tick few days ago.  Patient was seen in the ER 3 days ago, at that time he had a full medical work-up that revealed polysubstance use, with [facility] fever and Lyme titers being negative.  He also had psychiatry evaluation and neurologic evaluation, and was ultimately cleared by both services.  Patient is now having some SI.  We will consult TTS.  On my exam there is no focal neurologic abnormalities.  Patient is able to ambulate and he has intact and equal bicipital and patellar reflexes.  Pupils are 5 mm, but they are equal and reactive to light and there is no nystagmus.  We will consult TTS for his SI that appears to be passive.  Final Clinical Impressions(s) / ED Diagnoses   Final diagnoses:  Polysubstance abuse [facility](HCC)    ED Discharge Orders    None       Derwood KaplanNanavati, Fenna Semel, MD 06/12/19 1701

## 2019-06-12 NOTE — ED Notes (Signed)
ED Provider at bedside. 

## 2019-06-12 NOTE — ED Notes (Signed)
Peer Support at bedside. 

## 2019-06-13 NOTE — ED Notes (Signed)
[facility] has been called.

## 2019-06-13 NOTE — Progress Notes (Signed)
Pt accepted at [facility] by Dr. Abbey Chatters.  [employer].  Call report to [phone].    CSW spoke with Lyda Jester at [facility] who reports that [facility] is at capacity due to staffing issues and will not be able to accept this pt.  [facility] RN aware. Winferd Humphrey, MSW, LCSW Clinical Social Worker 06/13/2019 9:26 AM

## 2019-06-13 NOTE — ED Notes (Signed)
RN has spoken to Joseph Curtis at [facility] and provided all necessary information for intake. Patient has been accepted at facility.   Accepting Provider is [facility]  Number for Report - [phone]

## 2019-07-19 ENCOUNTER — Emergency Department (HOSPITAL_COMMUNITY)
Admission: EM | Admit: 2019-07-19 | Discharge: 2019-07-19 | Disposition: A | Discharge status: Home / Self Care | Payer: Medicare Other | Attending: Emergency Medicine | Admitting: Emergency Medicine

## 2019-07-19 ENCOUNTER — Encounter (HOSPITAL_COMMUNITY): Payer: Self-pay

## 2019-07-19 DIAGNOSIS — R0902 Hypoxemia: Secondary | ICD-10-CM | POA: Diagnosis not present

## 2019-07-19 DIAGNOSIS — T63441A Toxic effect of venom of bees, accidental (unintentional), initial encounter: Secondary | ICD-10-CM | POA: Insufficient documentation

## 2019-07-19 DIAGNOSIS — I10 Essential (primary) hypertension: Secondary | ICD-10-CM | POA: Diagnosis not present

## 2019-07-19 DIAGNOSIS — Z79899 Other long term (current) drug therapy: Secondary | ICD-10-CM | POA: Insufficient documentation

## 2019-07-19 DIAGNOSIS — F17210 Nicotine dependence, cigarettes, uncomplicated: Secondary | ICD-10-CM | POA: Insufficient documentation

## 2019-07-19 DIAGNOSIS — T7840XA Allergy, unspecified, initial encounter: Secondary | ICD-10-CM | POA: Diagnosis not present

## 2019-07-19 DIAGNOSIS — T63484A Toxic effect of venom of other arthropod, undetermined, initial encounter: Secondary | ICD-10-CM | POA: Diagnosis not present

## 2019-07-19 DIAGNOSIS — T782XXA Anaphylactic shock, unspecified, initial encounter: Secondary | ICD-10-CM

## 2019-07-19 MED ORDER — EPINEPHRINE 0.3 MG/0.3ML IJ SOAJ: 0.3000 mg | INTRAMUSCULAR | 1 refills | Status: DC | PRN

## 2019-07-19 MED ORDER — PREDNISONE 50 MG PO TABS: 50.0000 mg | ORAL_TABLET | Freq: Every day | ORAL | 0 refills | Status: DC

## 2019-07-19 NOTE — ED Notes (Signed)
Abigail, Pa at bedside.

## 2019-07-19 NOTE — ED Provider Notes (Signed)
Patient states he would like to be discharged home.  I advised him that there is a risk that this could get worse and he possibly have a significant reaction.  The patient understands this risk and states he like to discharge home.  I did refill his EpiPen and given prednisone.  Told him take Benadryl as well.   Dalia Heading, PA-C 07/19/19 1722    Lucrezia Starch, MD 07/19/19 1758

## 2019-07-19 NOTE — ED Triage Notes (Signed)
Patient arrived via [facility] from home.   Patient reports being stung on his left abdomen. Patient gave himself 2 shots of epinephrine that were expired.   EMS arrived and gave:  125 solumedrol 50 Benadryl 100 NS   A/Ox4

## 2019-07-19 NOTE — Discharge Instructions (Signed)
Return here as needed. Follow up with your doctor. °

## 2019-07-19 NOTE — ED Provider Notes (Signed)
[facility]-EMERGENCY DEPT Provider Note   CSN: [id number] Arrival date & time: 07/19/19  1311     History   Chief Complaint Chief Complaint  Patient presents with  . Insect Bite  . Allergic Reaction    HPI Vanetta MuldersSheldon M Gailey is a 58 y.o. 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  The patient was stung by a bee today.  He had onset of chest tightness and used his out of date EpiPen.  He states that he continued to have chest tightness and then felt like he was going to pass out and had loss of vision.  He gave himself a second EpiPen also out of date.  By this time fire had arrived.  EMS reports that when they got there fire was trying to ambulate the patient who became suddenly very gray and weak.  They immediately placed him on the EMS gurney.  Paramedic reports that his pulses were very thready and they immediately laid him back, began giving him fluids, 50 mg of IV Benadryl and 125 of Solu-Medrol.  Patient's symptoms improved and he did not fully lose consciousness.  He denies any hives or wheezing.  He denies angioedema or swelling of the mouth or throat.  At previous episode of anaphylaxis the patient was asymptomatic and suddenly went into cardiogenic shock.       The history is provided by the patient, medical records and the EMS personnel.  Allergic Reaction   Past Medical History:  Diagnosis Date  . Anxiety   . Back pain    chronic  . Depression   . Fall    from [place]  . Hypertension   . Neck pain    chronic  . Panic attacks     Patient Active Problem List   Diagnosis Date Noted  . Anxiety 06/16/2015  . Posttraumatic stress disorder 06/16/2015  . Tardive dyskinesia 06/16/2015  . Chronic pain syndrome 11/30/2014  . Legg-Calve-Perthes disease 11/30/2014  . Panic disorder 11/30/2014    History reviewed. No pertinent surgical history.      Home Medications    Prior to Admission medications   Medication  Sig Start Date End Date Taking? Authorizing Provider  ALPRAZolam Prudy Feeler(XANAX) 1 MG tablet Take 1 mg by mouth 3 (three) times daily as needed for anxiety.     [provider]  amphetamine-dextroamphetamine (ADDERALL XR) 20 MG 24 hr capsule Take 20 mg by mouth daily.  04/26/15   [provider]  benztropine (COGENTIN) 1 MG tablet Take 1 tablet (1 mg total) by mouth 2 (two) times daily. Patient not taking: Reported on 06/15/2015 06/06/15   Oswaldo Conroyreech, Victoria, PA-C  citalopram (CELEXA) 20 MG tablet Take 20 mg by mouth daily. 06/06/15   [provider]  divalproex (DEPAKOTE) 250 MG DR tablet Take 250 mg by mouth 2 (two) times daily. Instructions: Take along with 500 mg dose bid. 05/02/15   [provider]  divalproex (DEPAKOTE) 500 MG DR tablet Take 500 mg by mouth 2 (two) times daily. Instructions: Take along with 250 mg dose bid. 06/06/15   [provider]  furosemide (LASIX) 20 MG tablet Take 20 mg by mouth daily. 06/06/15   [provider]  HYDROcodone-acetaminophen (NORCO) 10-325 MG per tablet Take 1 tablet by mouth every 6 (six) hours as needed for moderate pain or severe pain.     [provider]  ibuprofen (ADVIL,MOTRIN) 200 MG tablet Take 800 mg by mouth every  6 (six) hours as needed for moderate pain.     [provider]  loratadine (CLARITIN) 10 MG tablet Take 10 mg by mouth daily. 06/06/15   [provider]  losartan (COZAAR) 50 MG tablet Take 1 tablet (50 mg total) by mouth daily. 06/10/19   Antonietta Breach, PA-C  Melatonin 10 MG CAPS Take 10 mg by mouth at bedtime.    [provider]  propranolol (INDERAL) 40 MG tablet Take 2 tablets (80 mg total) by mouth daily. 06/06/15   Al Corpus, PA-C  rizatriptan (MAXALT) 10 MG tablet Take 10 mg by mouth daily as needed for migraine. May repeat in 2 hours if needed    [provider]    Family History History reviewed. No pertinent family history.  Social History  Social History   Tobacco Use  . Smoking status: Current Every Day Smoker  . Smokeless tobacco: Never Used  Substance Use Topics  . Alcohol use: No  . Drug use: Not on file     Allergies   Bee venom, Codeine, and Penicillins   Review of Systems Review of Systems Ten systems reviewed and are negative for acute change, except as noted in the HPI. \ Physical Exam Updated Vital Signs BP (!) 153/84   Pulse 62   Temp 97.7 F (36.5 C) (Oral)   Resp (!) 21   SpO2 100%   Physical Exam Vitals signs and nursing note reviewed.  Constitutional:      General: He is not in acute distress.    Appearance: He is well-developed. He is not diaphoretic.  HENT:     Head: Normocephalic and atraumatic.     Nose: Nose normal.     Mouth/Throat:     Mouth: Mucous membranes are dry.  Eyes:     General: No scleral icterus.    Conjunctiva/sclera: Conjunctivae normal.  Neck:     Musculoskeletal: Normal range of motion and neck supple.  Cardiovascular:     Rate and Rhythm: Normal rate and regular rhythm.     Heart sounds: Normal heart sounds.  Pulmonary:     Effort: Pulmonary effort is normal. No respiratory distress.     Breath sounds: Normal breath sounds. No stridor. No wheezing.  Abdominal:     Palpations: Abdomen is soft.     Tenderness: There is no abdominal tenderness.  Skin:    General: Skin is warm and dry.  Neurological:     Mental Status: He is alert.  Psychiatric:        Behavior: Behavior normal.      ED Treatments / Results  Labs (all labs ordered are listed, but only abnormal results are displayed) Labs Reviewed - No data to display  EKG None  Radiology No results found.  Procedures Procedures (including critical care time)  Medications Ordered in ED Medications - No data to display   Initial Impression / Assessment and Plan / ED Course  I have reviewed the triage vital signs and the nursing notes.  Pertinent labs & imaging results that were available  during my care of the patient were reviewed by me and considered in my medical decision making (see chart for details).        Patient here with likely anaphylactic reaction to bee sting today.  He has been normotensive with normal vital signs here in the emergency department.  I discussed with the patient that at the very least he will need a 4-hour observation.  He is reluctant to  stay for that.  I also discussed that he may need to be admitted.  Patient is also resistant to this idea but has agreed to stay for 4 hours.  His observation is not complete at the end of my shift and I have given signout to PA lawyer.  The patient will need a discussion about risks given the fact that sometimes these are biphasic events and with his history of cardiogenic shock with his last bee sting he may do better to be observed however the patient is competent to make his own decision.  I have discussed the case with PA lawyer who understands and agrees with plan of care. Final Clinical Impressions(s) / ED Diagnoses   Final diagnoses:  None    ED Discharge Orders    None       Arthor CaptainHarris, Drexel Ivey, PA-C 07/19/19 1658    Tegeler, Canary Brimhristopher J, MD 07/20/19 [phone]

## 2019-08-12 DIAGNOSIS — K4091 Unilateral inguinal hernia, without obstruction or gangrene, recurrent: Secondary | ICD-10-CM | POA: Diagnosis not present

## 2019-08-24 ENCOUNTER — Other Ambulatory Visit: Payer: Self-pay | Admitting: General Surgery

## 2019-09-21 ENCOUNTER — Encounter (HOSPITAL_COMMUNITY): Payer: Self-pay | Admitting: Vascular Surgery

## 2019-09-21 ENCOUNTER — Encounter (HOSPITAL_COMMUNITY): Payer: Self-pay | Admitting: Anesthesiology

## 2019-09-21 NOTE — Progress Notes (Addendum)
[id number] - [place], [address] [place] [address] Phone: [phone] Fax: [phone]    Your procedure is scheduled on Thursday, October 15th.  Report to [facility] Main Entrance "A" at 5:30 A.M., and check in at the Admitting office.  Call this number if you have problems the morning of surgery:  [phone]  Call [phone] if you have any questions prior to your surgery date Monday-Friday 8am-4pm   Remember:  Do not eat after midnight the night before your surgery  You may drink clear liquids until 4:30 the morning of your surgery.   Clear liquids allowed are: Water, Non-Citrus Juices (without pulp), Carbonated Beverages, Clear Tea, Black Coffee Only, and Gatorade   Please complete your PRE-SURGERY ENSURE that was provided to you by 4:30 A.M. the morning of surgery. Please, if able, drink it in one setting. DO NOT SIP.    Take these medicines the morning of surgery with A SIP OF WATER  ARIPiprazole (ABILIFY)  atorvastatin (LIPITOR)  citalopram (CELEXA)  FLUoxetine (PROZAC)  gabapentin (NEURONTIN)  If needed - ALPRAZolam Duanne Moron),  EPINEPHrine/injection,  7 days prior to surgery STOP taking any Aspirin (unless otherwise instructed by your surgeon), Aleve, Naproxen, Ibuprofen, Motrin, Advil, Goody's, BC's, all herbal medications, fish oil, and all vitamins.   The Morning of Surgery  Do not wear jewelry, make-up or nail polish.  Do not wear lotions, powders, or perfumes/colognes, or deodorant  Do not shave 48 hours prior to surgery.  Men may shave face and neck.  Do not bring valuables to the hospital.  [facility] is not responsible for any belongings or valuables.  If you are a smoker, DO NOT Smoke 24 hours prior to surgery IF you wear a CPAP at night please bring your mask, tubing, and machine the morning of surgery   Remember that you must have someone to transport you home after your surgery,  and remain with you for 24 hours if you are discharged the same day.  Contacts, glasses, hearing aids, dentures or bridgework may not be worn into surgery.   Leave your suitcase in the car.  After surgery it may be brought to your room.  For patients admitted to the hospital, discharge time will be determined by your treatment team.  Patients discharged the day of surgery will not be allowed to drive home.    Special instructions:   [facility]- Preparing For Surgery  Before surgery, you can play an important role. Because skin is not sterile, your skin needs to be as free of germs as possible. You can reduce the number of germs on your skin by washing with CHG (chlorahexidine gluconate) Soap before surgery.  CHG is an antiseptic cleaner which kills germs and bonds with the skin to continue killing germs even after washing.    Oral Hygiene is also important to reduce your risk of infection.  Remember - BRUSH YOUR TEETH THE MORNING OF SURGERY WITH YOUR REGULAR TOOTHPASTE  Please do not use if you have an allergy to CHG or antibacterial soaps. If your skin becomes reddened/irritated stop using the CHG.  Do not shave (including legs and underarms) for at least 48 hours prior to first CHG shower. It is OK to shave your face.  Please follow these instructions carefully.   1. Shower the NIGHT BEFORE SURGERY and the MORNING OF SURGERY with CHG Soap.   2. If you chose to wash your hair, wash your  hair first as usual with your normal shampoo.  3. After you shampoo, rinse your hair and body thoroughly to remove the shampoo.  4. Use CHG as you would any other liquid soap. You can apply CHG directly to the skin and wash gently with a scrungie or a clean washcloth.   5. Apply the CHG Soap to your body ONLY FROM THE NECK DOWN.  Do not use on open wounds or open sores. Avoid contact with your eyes, ears, mouth and genitals (private parts). Wash Face and genitals (private parts)  with your normal  soap.   6. Wash thoroughly, paying special attention to the area where your surgery will be performed.  7. Thoroughly rinse your body with warm water from the neck down.  8. DO NOT shower/wash with your normal soap after using and rinsing off the CHG Soap.  9. Pat yourself dry with a CLEAN TOWEL.  10. Wear CLEAN PAJAMAS to bed the night before surgery, wear comfortable clothes the morning of surgery  11. Place CLEAN SHEETS on your bed the night of your first shower and DO NOT SLEEP WITH PETS.  Day of Surgery: Do not apply any deodorants/lotions. Please shower the morning of surgery with the CHG soap  Please wear clean clothes to the hospital/surgery center.   Remember to brush your teeth WITH YOUR REGULAR TOOTHPASTE.  Please read over the following fact sheets that you were given.

## 2019-09-22 ENCOUNTER — Encounter (HOSPITAL_COMMUNITY)
Admission: RE | Admit: 2019-09-22 | Discharge: 2019-09-22 | Disposition: A | Discharge status: Home / Self Care | Payer: Medicare Other | Source: Ambulatory Visit | Attending: General Surgery | Admitting: General Surgery

## 2019-09-22 ENCOUNTER — Encounter (HOSPITAL_COMMUNITY): Payer: Self-pay

## 2019-09-22 ENCOUNTER — Other Ambulatory Visit: Payer: Self-pay

## 2019-09-22 DIAGNOSIS — Z79899 Other long term (current) drug therapy: Secondary | ICD-10-CM | POA: Insufficient documentation

## 2019-09-22 DIAGNOSIS — I10 Essential (primary) hypertension: Secondary | ICD-10-CM | POA: Diagnosis not present

## 2019-09-22 DIAGNOSIS — K4090 Unilateral inguinal hernia, without obstruction or gangrene, not specified as recurrent: Secondary | ICD-10-CM | POA: Diagnosis not present

## 2019-09-22 DIAGNOSIS — Z01812 Encounter for preprocedural laboratory examination: Secondary | ICD-10-CM | POA: Diagnosis not present

## 2019-09-22 HISTORY — DX: Unspecified osteoarthritis, unspecified site: M19.90

## 2019-09-22 HISTORY — DX: Pneumonia, unspecified organism: J18.9

## 2019-09-22 HISTORY — DX: Juvenile osteochondrosis of head of femur (legg-calve-perthes), right leg: M91.11

## 2019-09-22 HISTORY — DX: Headache, unspecified: R51.9

## 2019-09-22 LAB — COMPREHENSIVE METABOLIC PANEL
ALT: 16 U/L (ref 0–44)
AST: 21 U/L (ref 15–41)
Albumin: 4.1 g/dL (ref 3.5–5.0)
Alkaline Phosphatase: 80 U/L (ref 38–126)
Anion gap: 7 (ref 5–15)
BUN: 15 mg/dL (ref 6–20)
CO2: 23 mmol/L (ref 22–32)
Calcium: 8.9 mg/dL (ref 8.9–10.3)
Chloride: 107 mmol/L (ref 98–111)
Creatinine, Ser: 1.05 mg/dL (ref 0.61–1.24)
GFR calc Af Amer: >60 mL/min (ref >60)
GFR calc non Af Amer: >60 mL/min (ref >60)
Glucose, Bld: 82 mg/dL (ref 70–99)
Potassium: 4.3 mmol/L (ref 3.5–5.1)
Sodium: 137 mmol/L (ref 135–145)
Total Bilirubin: 0.4 mg/dL (ref 0.3–1.2)
Total Protein: 6.9 g/dL (ref 6.5–8.1)

## 2019-09-22 LAB — CBC
HCT: 43.6 % (ref 39.0–52.0)
Hemoglobin: 14.7 g/dL (ref 13.0–17.0)
MCH: 32.4 pg (ref 26.0–34.0)
MCHC: 33.7 g/dL (ref 30.0–36.0)
MCV: 96 fL (ref 80.0–100.0)
Platelets: 322 10*3/uL (ref 150–400)
RBC: 4.54 MIL/uL (ref 4.22–5.81)
RDW: 13.2 % (ref 11.5–15.5)
WBC: 8.6 10*3/uL (ref 4.0–10.5)
nRBC: 0 % (ref 0.0–0.2)

## 2019-09-22 NOTE — Progress Notes (Signed)
PCP - per patient not sure of the provider's name recently started going there but facility in [place], [place] Cardiologist - denies  Chest x-ray - N/A EKG - 06/12/2019 Stress Test - denies ECHO - denies Cardiac Cath - denies  Sleep Study - denies CPAP - N/A  Blood Thinner Instructions: N/A Aspirin Instructions: N/A  ERAS Protcol - Yes PRE-SURGERY Ensure - N/A   COVID TEST- Scheduled for Monday, 09/27/2019  Anesthesia review: YES, recent ED admissions, hx of substance abuse, Per patient, has serious panic attacks and had one recently within the past week   Patient denies shortness of breath, fever, cough and chest pain at PAT appointment  Patient verbalized understanding of instructions that were given to them at the PAT appointment. Patient was also instructed that they will need to review over the PAT instructions again at home before surgery.

## 2019-09-23 NOTE — Anesthesia Preprocedure Evaluation (Deleted)
Anesthesia Evaluation  Patient identified by MRN, date of birth, ID band Patient awake    Reviewed: Allergy & Precautions, NPO status , Patient's Chart, lab work & pertinent test results  Airway Mallampati: II  TM Distance: >3 FB Neck ROM: Full    Dental no notable dental hx.    Pulmonary neg pulmonary ROS, Current Smoker,    Pulmonary exam normal breath sounds clear to auscultation       Cardiovascular hypertension, Pt. on medications negative cardio ROS Normal cardiovascular exam Rhythm:Regular Rate:Normal     Neuro/Psych  Headaches, PSYCHIATRIC DISORDERS Anxiety Depression    GI/Hepatic negative GI ROS, (+)     substance abuse  cocaine use and marijuana use,   Endo/Other  negative endocrine ROS  Renal/GU negative Renal ROS  negative genitourinary   Musculoskeletal  (+) Arthritis ,   Abdominal   Peds  Hematology negative hematology ROS (+)   Anesthesia Other Findings   Reproductive/Obstetrics                          Anesthesia Physical Anesthesia Plan  ASA: III  Anesthesia Plan: General and Regional   Post-op Pain Management:  Regional for Post-op pain   Induction: Intravenous  PONV Risk Score and Plan: 1 and Midazolam, Dexamethasone and Ondansetron  Airway Management Planned: Oral ETT  Additional Equipment:   Intra-op Plan:   Post-operative Plan: Extubation in OR  Informed Consent: I have reviewed the patients History and Physical, chart, labs and discussed the procedure including the risks, benefits and alternatives for the proposed anesthesia with the patient or authorized representative who has indicated his/her understanding and acceptance.     Dental advisory given  Plan Discussed with: CRNA  Anesthesia Plan Comments: (Procedure cancelled 2/2 UDS positive for cocaine)     Anesthesia Quick Evaluation

## 2019-09-27 ENCOUNTER — Inpatient Hospital Stay (HOSPITAL_COMMUNITY): Admission: RE | Admit: 2019-09-27 | Payer: Medicaid Other | Source: Ambulatory Visit

## 2019-09-28 DIAGNOSIS — K4090 Unilateral inguinal hernia, without obstruction or gangrene, not specified as recurrent: Secondary | ICD-10-CM | POA: Diagnosis not present

## 2019-09-28 DIAGNOSIS — Z6822 Body mass index (BMI) 22.0-22.9, adult: Secondary | ICD-10-CM | POA: Diagnosis not present

## 2019-09-28 DIAGNOSIS — Z23 Encounter for immunization: Secondary | ICD-10-CM | POA: Diagnosis not present

## 2019-09-28 DIAGNOSIS — I10 Essential (primary) hypertension: Secondary | ICD-10-CM | POA: Diagnosis not present

## 2019-09-28 DIAGNOSIS — E785 Hyperlipidemia, unspecified: Secondary | ICD-10-CM | POA: Diagnosis not present

## 2019-09-30 ENCOUNTER — Encounter (HOSPITAL_COMMUNITY)
Admission: RE | Disposition: A | Discharge status: Home / Self Care | Payer: Self-pay | Source: Home / Self Care | Attending: General Surgery

## 2019-09-30 ENCOUNTER — Ambulatory Visit (HOSPITAL_COMMUNITY)
Admission: RE | Admit: 2019-09-30 | Discharge: 2019-09-30 | Disposition: A | Discharge status: Home / Self Care | Payer: Medicare Other | Attending: General Surgery | Admitting: General Surgery

## 2019-09-30 ENCOUNTER — Other Ambulatory Visit: Payer: Self-pay

## 2019-09-30 ENCOUNTER — Encounter (HOSPITAL_COMMUNITY): Payer: Self-pay

## 2019-09-30 DIAGNOSIS — Z885 Allergy status to narcotic agent status: Secondary | ICD-10-CM | POA: Diagnosis not present

## 2019-09-30 DIAGNOSIS — Z20828 Contact with and (suspected) exposure to other viral communicable diseases: Secondary | ICD-10-CM | POA: Insufficient documentation

## 2019-09-30 DIAGNOSIS — F17200 Nicotine dependence, unspecified, uncomplicated: Secondary | ICD-10-CM | POA: Diagnosis not present

## 2019-09-30 DIAGNOSIS — Z539 Procedure and treatment not carried out, unspecified reason: Secondary | ICD-10-CM | POA: Insufficient documentation

## 2019-09-30 DIAGNOSIS — I10 Essential (primary) hypertension: Secondary | ICD-10-CM | POA: Diagnosis not present

## 2019-09-30 DIAGNOSIS — E7800 Pure hypercholesterolemia, unspecified: Secondary | ICD-10-CM | POA: Insufficient documentation

## 2019-09-30 DIAGNOSIS — Z880 Allergy status to penicillin: Secondary | ICD-10-CM | POA: Insufficient documentation

## 2019-09-30 DIAGNOSIS — K4091 Unilateral inguinal hernia, without obstruction or gangrene, recurrent: Secondary | ICD-10-CM | POA: Insufficient documentation

## 2019-09-30 DIAGNOSIS — F329 Major depressive disorder, single episode, unspecified: Secondary | ICD-10-CM | POA: Insufficient documentation

## 2019-09-30 DIAGNOSIS — Z79899 Other long term (current) drug therapy: Secondary | ICD-10-CM | POA: Diagnosis not present

## 2019-09-30 LAB — RAPID URINE DRUG SCREEN, HOSP PERFORMED
Amphetamines: NOT DETECTED
Barbiturates: NOT DETECTED
Benzodiazepines: NOT DETECTED
Cocaine: POSITIVE — AB
Opiates: NOT DETECTED
Tetrahydrocannabinol: POSITIVE — AB

## 2019-09-30 LAB — SARS CORONAVIRUS 2 (TAT 6-24 HRS): SARS Coronavirus 2: NEGATIVE

## 2019-09-30 SURGERY — CANCELLED PROCEDURE

## 2019-09-30 MED ORDER — PROPOFOL 10 MG/ML IV BOLUS
INTRAVENOUS | Status: AC
  Filled 2019-09-30: qty 20

## 2019-09-30 MED ORDER — FENTANYL CITRATE (PF) 250 MCG/5ML IJ SOLN
INTRAMUSCULAR | Status: AC
  Filled 2019-09-30: qty 5

## 2019-09-30 MED ORDER — FENTANYL CITRATE (PF) 100 MCG/2ML IJ SOLN
INTRAMUSCULAR | Status: AC
  Filled 2019-09-30: qty 2

## 2019-09-30 MED ORDER — LACTATED RINGERS IV SOLN
INTRAVENOUS | Status: DC
  Administered 2019-09-30: 07:00:00 via INTRAVENOUS

## 2019-09-30 MED ORDER — ONDANSETRON HCL 4 MG/2ML IJ SOLN
INTRAMUSCULAR | Status: AC
  Filled 2019-09-30: qty 2

## 2019-09-30 MED ORDER — MIDAZOLAM HCL 2 MG/2ML IJ SOLN
INTRAMUSCULAR | Status: AC
  Filled 2019-09-30: qty 2

## 2019-09-30 MED ORDER — CIPROFLOXACIN IN D5W 400 MG/200ML IV SOLN
400.0000 mg | INTRAVENOUS | Status: DC
  Filled 2019-09-30: qty 200

## 2019-09-30 MED ORDER — ACETAMINOPHEN 500 MG PO TABS
1000.0000 mg | ORAL_TABLET | ORAL | Status: AC
  Administered 2019-09-30: 07:00:00 1000 mg via ORAL
  Filled 2019-09-30: qty 2

## 2019-09-30 MED ORDER — DEXAMETHASONE SODIUM PHOSPHATE 10 MG/ML IJ SOLN
INTRAMUSCULAR | Status: AC
  Filled 2019-09-30: qty 1

## 2019-09-30 MED ORDER — ENSURE PRE-SURGERY PO LIQD: 296.0000 mL | Freq: Once | ORAL | Status: DC

## 2019-09-30 MED ORDER — GABAPENTIN 300 MG PO CAPS
300.0000 mg | ORAL_CAPSULE | ORAL | Status: AC
  Administered 2019-09-30: 300 mg via ORAL
  Filled 2019-09-30: qty 1

## 2019-09-30 MED ORDER — LIDOCAINE 2% (20 MG/ML) 5 ML SYRINGE
INTRAMUSCULAR | Status: AC
  Filled 2019-09-30: qty 5

## 2019-09-30 MED ORDER — CELECOXIB 200 MG PO CAPS
400.0000 mg | ORAL_CAPSULE | ORAL | Status: AC
  Administered 2019-09-30: 07:00:00 400 mg via ORAL
  Filled 2019-09-30: qty 2

## 2019-09-30 NOTE — H&P (Signed)
29 yom referred by Dr Tomasa Blase for right inguinal hernia. he is on disability. he at some point that he cannot recall has bilateral inguinal hernia repairs and had the left testicle removed also. He thinks may have had umbo hernia repaired and left side fixed twice. he has some issues with urinary stream. he noted a recurrent right groin bulge that has worsened over time. always reduces, causes some discomfort as well. he would like to consider repair  Past Surgical History  Knee Surgery  Right. Shoulder Surgery  Left.  Allergies  Penicillins  Codeine and Related   Medication History  ALPRAZolam (2MG  Tablet, Oral) Active. Lisinopril (20MG  Tablet, Oral) Active. Losartan Potassium (50MG  Tablet, Oral) Active. PROzac (10MG  Capsule, Oral) Active. Medications Reconciled  Social History  Caffeine use  Tea. Illicit drug use  Remotely quit drug use. No alcohol use  Tobacco use  Current every day smoker.  Family History  Alcohol Abuse  Father, Mother, Sister. Depression  Daughter, Father, Mother, Sister. Hypertension  Father, Mother.  Other Problems  Depression  High blood pressure  Hypercholesterolemia   Review of Systems  General Present- Weight Loss. Not Present- Appetite Loss, Chills, Fatigue, Fever, Night Sweats and Weight Gain. Skin Present- Non-Healing Wounds. Not Present- Change in Wart/Mole, Dryness, Hives, Jaundice, New Lesions, Rash and Ulcer. HEENT Not Present- Earache, Hearing Loss, Hoarseness, Nose Bleed, Oral Ulcers, Ringing in the Ears, Seasonal Allergies, Sinus Pain, Sore Throat, Visual Disturbances, Wears glasses/contact lenses and Yellow Eyes. Respiratory Not Present- Bloody sputum, Chronic Cough, Difficulty Breathing, Snoring and Wheezing. Cardiovascular Not Present- Chest Pain, Difficulty Breathing Lying Down, Leg Cramps, Palpitations, Rapid Heart Rate, Shortness of Breath and Swelling of Extremities. Gastrointestinal Not Present- Abdominal  Pain, Bloating, Bloody Stool, Change in Bowel Habits, Chronic diarrhea, Constipation, Difficulty Swallowing, Excessive gas, Gets full quickly at meals, Hemorrhoids, Indigestion, Nausea, Rectal Pain and Vomiting. Male Genitourinary Not Present- Blood in Urine, Change in Urinary Stream, Frequency, Impotence, Nocturia, Painful Urination, Urgency and Urine Leakage. Musculoskeletal Not Present- Back Pain, Joint Pain, Joint Stiffness, Muscle Pain, Muscle Weakness and Swelling of Extremities. Neurological Not Present- Decreased Memory, Fainting, Headaches, Numbness, Seizures, Tingling, Tremor, Trouble walking and Weakness. Psychiatric Present- Depression. Not Present- Anxiety, Bipolar, Change in Sleep Pattern, Fearful and Frequent crying. Endocrine Not Present- Cold Intolerance, Excessive Hunger, Hair Changes, Heat Intolerance, Hot flashes and New Diabetes. Hematology Not Present- Blood Thinners, Easy Bruising, Excessive bleeding, Gland problems, HIV and Persistent Infections.  Vitals  Weight: 139 lb Height: 67in Body Surface Area: 1.73 m Body Mass Index: 21.77 kg/m  Temp.: 98.18F (Oral)  Pulse: 83 (Regular)  BP: 130/82(Sitting, Left Arm, Standard) Physical Exam  General Mental Status-Alert. Orientation-Oriented X3. Eye Sclera/Conjunctiva - Bilateral-No scleral icterus. Chest and Lung Exam Chest and lung exam reveals -quiet, even and easy respiratory effort with no use of accessory muscles. Cardiovascular Cardiovascular examination reveals -normal heart sounds, regular rate and rhythm with no murmurs. Abdomen Note: soft nontender nondistended well healed groin scars no lih absent left testicle right testicle normal reducible rih Neurologic Neurologic evaluation reveals -alert and oriented x 3 with no impairment of recent or remote memory.   Assessment & Plan   RECURRENT RIGHT INGUINAL HERNIA (K40.91) Story: Right inguinal hernia repair We discussed observation  versus repair. We discussed both laparoscopic and open inguinal hernia repairs. I described the procedure in detail. Goals should be achieved with surgery. We discussed the usage of mesh and the rationale behind that. We went over the pathophysiology of inguinal hernia. We have  elected to perform open inguinal hernia repair with mesh. We discussed the risks including bleeding, infection, recurrence, postoperative pain and chronic groin pain, testicular injury, urinary retention, numbness in groin and around incision.

## 2019-09-30 NOTE — Interval H&P Note (Signed)
History and Physical Interval Note:  09/30/2019 12:49 PM  Joseph Curtis  has presented today for surgery, with the diagnosis of RIGHT INGUINAL HERNIA.  The various methods of treatment have been discussed with the patient and family. After consideration of risks, benefits and other options for treatment, the patient has consented to  Procedure(s): RIGHT INGUINAL HERNIA REPAIR WITH MESH (Right) as a surgical intervention.  The patient's history has been reviewed, patient examined, no change in status, stable for surgery.  I have reviewed the patient's chart and labs.  Questions were answered to the patient's satisfaction.     Rolm Bookbinder

## 2019-10-19 ENCOUNTER — Other Ambulatory Visit: Payer: Self-pay | Admitting: General Surgery

## 2019-11-01 ENCOUNTER — Other Ambulatory Visit (HOSPITAL_COMMUNITY): Payer: Medicare Other

## 2019-11-03 NOTE — Progress Notes (Signed)
[id number] - [place], [place] [address] Phone: [phone] Fax: [phone]      Your procedure is scheduled on Monday, November 08, 2019.   Report to [facility] Main Entrance "A" at 10:30 A.M., and check in at the Admitting office.   Call this number if you have problems the morning of surgery:  [phone]  Call [phone] if you have any questions prior to your surgery date Monday-Friday 8am-4pm    Remember:  Do not eat after midnight the night before your surgery  You may drink clear liquids until 10:30AM the morning of your surgery.    Clear liquids allowed are: Water, Non-Citrus Juices (without pulp), Carbonated Beverages, Clear Tea, Black Coffee Only, and Gatorade    Take these medicines the morning of surgery with A SIP OF WATER :  Alprazolam (Xanax) - if needed Aripiprazole (Abilify) Atorvastatin (Lipitor) Fluoxetine (Prozac) Gabapentin (Neurontin)  7 days prior to surgery STOP taking any Aspirin (unless otherwise instructed by your surgeon), Aleve, Naproxen, Ibuprofen, Motrin, Advil, Goody's, BC's, all herbal medications, fish oil, and all vitamins.    The Morning of Surgery  Do not wear jewelry, make-up or nail polish.  Do not wear lotions, powders, or perfumes/colognes, or deodorant  Do not shave 48 hours prior to surgery.  Men may shave face and neck.  Do not bring valuables to the hospital.  [facility] is not responsible for any belongings or valuables.  If you are a smoker, DO NOT Smoke 24 hours prior to surgery  If you wear a CPAP at night please bring your mask, tubing, and machine the morning of surgery   Remember that you must have someone to transport you home after your surgery, and remain with you for 24 hours if you are discharged the same day.   Please bring cases for contacts, glasses, hearing aids, dentures or bridgework because it cannot be worn into  surgery.    Leave your suitcase in the car.  After surgery it may be brought to your room.  For patients admitted to the hospital, discharge time will be determined by your treatment team.  Patients discharged the day of surgery will not be allowed to drive home.    Special instructions:   [facility]- Preparing For Surgery  Before surgery, you can play an important role. Because skin is not sterile, your skin needs to be as free of germs as possible. You can reduce the number of germs on your skin by washing with CHG (chlorahexidine gluconate) Soap before surgery.  CHG is an antiseptic cleaner which kills germs and bonds with the skin to continue killing germs even after washing.    Oral Hygiene is also important to reduce your risk of infection.  Remember - BRUSH YOUR TEETH THE MORNING OF SURGERY WITH YOUR REGULAR TOOTHPASTE  Please do not use if you have an allergy to CHG or antibacterial soaps. If your skin becomes reddened/irritated stop using the CHG.  Do not shave (including legs and underarms) for at least 48 hours prior to first CHG shower. It is OK to shave your face.  Please follow these instructions carefully.   1. Shower the NIGHT BEFORE SURGERY and the MORNING OF SURGERY with CHG Soap.   2. If you chose to wash your hair, wash your hair first as usual with your normal shampoo.  3. After you shampoo, rinse your hair and body thoroughly to remove  the shampoo.  4. Use CHG as you would any other liquid soap. You can apply CHG directly to the skin and wash gently with a scrungie or a clean washcloth.   5. Apply the CHG Soap to your body ONLY FROM THE NECK DOWN.  Do not use on open wounds or open sores. Avoid contact with your eyes, ears, mouth and genitals (private parts). Wash Face and genitals (private parts)  with your normal soap.   6. Wash thoroughly, paying special attention to the area where your surgery will be performed.  7. Thoroughly rinse your body with warm  water from the neck down.  8. DO NOT shower/wash with your normal soap after using and rinsing off the CHG Soap.  9. Pat yourself dry with a CLEAN TOWEL.  10. Wear CLEAN PAJAMAS to bed the night before surgery, wear comfortable clothes the morning of surgery  11. Place CLEAN SHEETS on your bed the night of your first shower and DO NOT SLEEP WITH PETS.    Day of Surgery:  Please shower the morning of surgery with the CHG soap Do not apply any deodorants/lotions. Please wear clean clothes to the hospital/surgery center.   Remember to brush your teeth WITH YOUR REGULAR TOOTHPASTE.   Please read over the following fact sheets that you were given.

## 2019-11-04 ENCOUNTER — Encounter (HOSPITAL_COMMUNITY)
Admission: RE | Admit: 2019-11-04 | Discharge: 2019-11-04 | Disposition: A | Discharge status: Home / Self Care | Payer: Medicare Other | Source: Ambulatory Visit | Attending: General Surgery | Admitting: General Surgery

## 2019-11-04 ENCOUNTER — Encounter (HOSPITAL_COMMUNITY): Payer: Self-pay

## 2019-11-04 ENCOUNTER — Other Ambulatory Visit: Payer: Self-pay

## 2019-11-04 ENCOUNTER — Other Ambulatory Visit (HOSPITAL_COMMUNITY)
Admission: RE | Admit: 2019-11-04 | Discharge: 2019-11-04 | Disposition: A | Discharge status: Home / Self Care | Payer: Medicare Other | Source: Ambulatory Visit | Attending: General Surgery | Admitting: General Surgery

## 2019-11-04 DIAGNOSIS — Z01812 Encounter for preprocedural laboratory examination: Secondary | ICD-10-CM | POA: Diagnosis not present

## 2019-11-04 DIAGNOSIS — Z20828 Contact with and (suspected) exposure to other viral communicable diseases: Secondary | ICD-10-CM | POA: Insufficient documentation

## 2019-11-04 LAB — CBC
HCT: 49.1 % (ref 39.0–52.0)
Hemoglobin: 16.6 g/dL (ref 13.0–17.0)
MCH: 31.5 pg (ref 26.0–34.0)
MCHC: 33.8 g/dL (ref 30.0–36.0)
MCV: 93.2 fL (ref 80.0–100.0)
Platelets: 404 10*3/uL — ABNORMAL HIGH (ref 150–400)
RBC: 5.27 MIL/uL (ref 4.22–5.81)
RDW: 12.4 % (ref 11.5–15.5)
WBC: 7.7 10*3/uL (ref 4.0–10.5)
nRBC: 0 % (ref 0.0–0.2)

## 2019-11-04 NOTE — Progress Notes (Addendum)
Covid test scheduled for today. No h/o symptoms concerning for Covid. No h/o recent travel. No h/o exposure to Covid positive patients.  PCP - Pt cannot recall . Family practice in [place].  Cardiologist - denies  Chest x-ray - denies  EKG - 06-14-19  Stress Test - denies  ECHO - denies  Cardiac Cath - denies  AICD-denies PM-denies LOOP-denies  Sleep Study -  CPAP -   LABS-  ASA-  ERAS-pre-surgery ensure & clear liquids  HA1C-denies Fasting Blood Sugar -  Checks Blood Sugar _____ times a day  Anesthesia-  Pt denies having chest pain, sob, or fever at this time. All instructions explained to the pt, with a verbal understanding of the material. Pt agrees to go over the instructions while at home for a better understanding. Pt also instructed to self quarantine after being tested for COVID-19. The opportunity to ask questions was provided.

## 2019-11-04 NOTE — Progress Notes (Addendum)
[id number] [place], [place] - [address] AT [facility] [address] [place] [address] Phone: [phone] Fax: [phone]      Your procedure is scheduled on Monday, November 08, 2019.   Report to [facility] Main Entrance "A" at 10:30 A.M., and check in at the Admitting office.   Call this number if you have problems the morning of surgery:  [phone]  Call [phone] if you have any questions prior to your surgery date Monday-Friday 8am-4pm    Remember:  Do not eat after midnight the night before your surgery  You may drink clear liquids until0930AM the morning of your surgery.    Clear liquids allowed are: Water, Non-Citrus Juices (without pulp), Carbonated Beverages, Clear Tea, Black Coffee Only, and Gatorade  Please complete your PRE-SURGERY ENSURE that was provided to you by 0930AM the morning of surgery.  Please, if able, drink it in one setting. DO NOT SIP.     Take these medicines the morning of surgery with A SIP OF WATER :  Alprazolam (Xanax) - if needed Aripiprazole (Abilify) Atorvastatin (Lipitor) Fluoxetine (Prozac) Gabapentin (Neurontin)  7 days prior to surgery STOP taking any Aspirin (unless otherwise instructed by your surgeon), Aleve, Naproxen, Ibuprofen, Motrin, Advil, Goody's, BC's, all herbal medications, fish oil, and all vitamins.    The Morning of Surgery  Do not wear jewelry, make-up or nail polish.  Do not wear lotions, powders, or perfumes/colognes, or deodorant  Do not shave 48 hours prior to surgery.  Men may shave face and neck.  Do not bring valuables to the hospital.  [facility] is not responsible for any belongings or valuables.  If you are a smoker, DO NOT Smoke 24 hours prior to surgery  If you wear a CPAP at night please bring your mask, tubing, and machine the morning of surgery   Remember that you must have someone to transport you home after your surgery, and remain with you for 24  hours if you are discharged the same day.   Please bring cases for contacts, glasses, hearing aids, dentures or bridgework because it cannot be worn into surgery.    Leave your suitcase in the car.  After surgery it may be brought to your room.  For patients admitted to the hospital, discharge time will be determined by your treatment team.  Patients discharged the day of surgery will not be allowed to drive home.    Special instructions:   [place]- Preparing For Surgery  Before surgery, you can play an important role. Because skin is not sterile, your skin needs to be as free of germs as possible. You can reduce the number of germs on your skin by washing with CHG (chlorahexidine gluconate) Soap before surgery.  CHG is an antiseptic cleaner which kills germs and bonds with the skin to continue killing germs even after washing.    Oral Hygiene is also important to reduce your risk of infection.  Remember - BRUSH YOUR TEETH THE MORNING OF SURGERY WITH YOUR REGULAR TOOTHPASTE  Please do not use if you have an allergy to CHG or antibacterial soaps. If your skin becomes reddened/irritated stop using the CHG.  Do not shave (including legs and underarms) for at least 48 hours prior to first CHG shower. It is OK to shave your face.  Please follow these instructions carefully.   1. Shower the NIGHT BEFORE SURGERY and the MORNING OF SURGERY with CHG Soap.   2. If  you chose to wash your hair, wash your hair first as usual with your normal shampoo.  3. After you shampoo, rinse your hair and body thoroughly to remove the shampoo.  4. Use CHG as you would any other liquid soap. You can apply CHG directly to the skin and wash gently with a scrungie or a clean washcloth.   5. Apply the CHG Soap to your body ONLY FROM THE NECK DOWN.  Do not use on open wounds or open sores. Avoid contact with your eyes, ears, mouth and genitals (private parts). Wash Face and genitals (private parts)  with your  normal soap.   6. Wash thoroughly, paying special attention to the area where your surgery will be performed.  7. Thoroughly rinse your body with warm water from the neck down.  8. DO NOT shower/wash with your normal soap after using and rinsing off the CHG Soap.  9. Pat yourself dry with a CLEAN TOWEL.  10. Wear CLEAN PAJAMAS to bed the night before surgery, wear comfortable clothes the morning of surgery  11. Place CLEAN SHEETS on your bed the night of your first shower and DO NOT SLEEP WITH PETS.    Day of Surgery:  Please shower the morning of surgery with the CHG soap Do not apply any deodorants/lotions. Please wear clean clothes to the hospital/surgery center.   Remember to brush your teeth WITH YOUR REGULAR TOOTHPASTE.   Please read over the following fact sheets that you were given.

## 2019-11-05 LAB — NOVEL CORONAVIRUS, NAA (HOSP ORDER, SEND-OUT TO REF LAB; TAT 18-24 HRS): SARS-CoV-2, NAA: NOT DETECTED

## 2019-11-08 ENCOUNTER — Ambulatory Visit (HOSPITAL_BASED_OUTPATIENT_CLINIC_OR_DEPARTMENT_OTHER): Admission: RE | Admit: 2019-11-08 | Payer: Medicare Other | Source: Home / Self Care | Admitting: General Surgery

## 2019-11-08 ENCOUNTER — Encounter (HOSPITAL_BASED_OUTPATIENT_CLINIC_OR_DEPARTMENT_OTHER): Admission: RE | Payer: Self-pay | Source: Home / Self Care

## 2019-11-08 ENCOUNTER — Encounter (HOSPITAL_COMMUNITY): Payer: Self-pay | Admitting: Certified Registered"

## 2019-11-08 SURGERY — REPAIR, HERNIA, INGUINAL, ADULT
Anesthesia: General | Laterality: Right

## 2019-11-08 NOTE — H&P (Signed)
44 yom referred by Dr Tomasa Blase for right inguinal hernia. he is on disability. he at some point that he cannot recall has bilateral inguinal hernia repairs and had the left testicle removed also. he has some issues with urinary stream. he noted a recurrent right groin bulge that has worsened over time. always reduces, causes some discomfort as well. he would like to consider repair   Past Surgical History (April Staton, CMA; 08/12/2019 9:13 AM) Knee Surgery  Right. Shoulder Surgery  Left.  Allergies (April Staton, CMA; 08/12/2019 9:14 AM) Penicillins  Codeine and Related   Medication History (April Staton, CMA; 08/12/2019 9:16 AM) ALPRAZolam (2MG  Tablet, Oral) Active. Lisinopril (20MG  Tablet, Oral) Active. Losartan Potassium (50MG  Tablet, Oral) Active. PROzac (10MG  Capsule, Oral) Active. Medications Reconciled  Social History (April Staton, CMA; 08/12/2019 9:13 AM) Caffeine use  Tea. Illicit drug use  Remotely quit drug use. No alcohol use  Tobacco use  Current every day smoker.  Family History (April Staton, ; 08/12/2019 9:13 AM) Alcohol Abuse  Father, Mother, Sister. Depression  Daughter, Father, Mother, Sister. Hypertension  Father, Mother.  Other Problems (April Staton, CMA; 08/12/2019 9:13 AM) Depression  High blood pressure  Hypercholesterolemia     Review of Systems (April Staton CMA; 08/12/2019 9:13 AM) General Present- Weight Loss. Not Present- Appetite Loss, Chills, Fatigue, Fever, Night Sweats and Weight Gain. Skin Present- Non-Healing Wounds. Not Present- Change in Wart/Mole, Dryness, Hives, Jaundice, New Lesions, Rash and Ulcer. HEENT Not Present- Earache, Hearing Loss, Hoarseness, Nose Bleed, Oral Ulcers, Ringing in the Ears, Seasonal Allergies, Sinus Pain, Sore Throat, Visual Disturbances, Wears glasses/contact lenses and Yellow Eyes. Respiratory Not Present- Bloody sputum, Chronic Cough, Difficulty Breathing, Snoring and  Wheezing. Cardiovascular Not Present- Chest Pain, Difficulty Breathing Lying Down, Leg Cramps, Palpitations, Rapid Heart Rate, Shortness of Breath and Swelling of Extremities. Gastrointestinal Not Present- Abdominal Pain, Bloating, Bloody Stool, Change in Bowel Habits, Chronic diarrhea, Constipation, Difficulty Swallowing, Excessive gas, Gets full quickly at meals, Hemorrhoids, Indigestion, Nausea, Rectal Pain and Vomiting. Male Genitourinary Not Present- Blood in Urine, Change in Urinary Stream, Frequency, Impotence, Nocturia, Painful Urination, Urgency and Urine Leakage. Musculoskeletal Not Present- Back Pain, Joint Pain, Joint Stiffness, Muscle Pain, Muscle Weakness and Swelling of Extremities. Neurological Not Present- Decreased Memory, Fainting, Headaches, Numbness, Seizures, Tingling, Tremor, Trouble walking and Weakness. Psychiatric Present- Depression. Not Present- Anxiety, Bipolar, Change in Sleep Pattern, Fearful and Frequent crying. Endocrine Not Present- Cold Intolerance, Excessive Hunger, Hair Changes, Heat Intolerance, Hot flashes and New Diabetes. Hematology Not Present- Blood Thinners, Easy Bruising, Excessive bleeding, Gland problems, HIV and Persistent Infections.  Vitals (April Staton CMA; 08/12/2019 9:16 AM) 08/12/2019 9:16 AM Weight: 139 lb Height: 67in Body Surface Area: 1.73 m Body Mass Index: 21.77 kg/m  Temp.: 98.84F(Oral)  Pulse: 83 (Regular)  BP: 130/82 (Sitting, Left Arm, Standard)       Physical Exam 08/14/2019 MD; 08/12/2019 9:31 AM) General Mental Status-Alert. Orientation-Oriented X3.  Eye Sclera/Conjunctiva - Bilateral-No scleral icterus.  Chest and Lung Exam Chest and lung exam reveals -quiet, even and easy respiratory effort with no use of accessory muscles.  Cardiovascular Cardiovascular examination reveals -normal heart sounds, regular rate and rhythm with no murmurs.  Abdomen Note: soft nontender nondistended  well healed groin scars no lih absent left testicle right testicle normal reducible rih   Neurologic Neurologic evaluation reveals -alert and oriented x 3 with no impairment of recent or remote memory.    Assessment & Plan 08/14/2019 MD; 08/12/2019 9:35 AM) RECURRENT  RIGHT INGUINAL HERNIA (K40.91) Story: Right inguinal hernia repair We discussed observation versus repair. We discussed both laparoscopic and open inguinal hernia repairs. I described the procedure in detail. Goals should be achieved with surgery. We discussed the usage of mesh and the rationale behind that. We went over the pathophysiology of inguinal hernia. We have elected to perform open inguinal hernia repair with mesh. We discussed the risks including bleeding, infection, recurrence, postoperative pain and chronic groin pain, testicular injury, urinary retention, numbness in groin and around incision.

## 2019-11-10 ENCOUNTER — Other Ambulatory Visit: Payer: Self-pay

## 2019-11-10 NOTE — Patient Outreach (Signed)
[facility]) Care Management  11/10/2019  LACHARLES ALTSCHULER 1961/10/08 [id number]   Medication Adherence call to Mr. Adria Devon HIPPA Compliant Voice message left with a call back number. Mr. Hardenbrook is showing past due on Lisinopril 20 mg under [place].   [facility] Management Direct Dial [phone]  Fax [phone] [email or web address]

## 2020-03-20 ENCOUNTER — Other Ambulatory Visit: Payer: Self-pay

## 2020-03-20 ENCOUNTER — Encounter (HOSPITAL_COMMUNITY): Payer: Self-pay

## 2020-03-20 ENCOUNTER — Emergency Department (HOSPITAL_COMMUNITY)
Admission: EM | Admit: 2020-03-20 | Discharge: 2020-03-21 | Disposition: A | Discharge status: Home / Self Care | Payer: Medicare Other | Attending: Emergency Medicine | Admitting: Emergency Medicine

## 2020-03-20 DIAGNOSIS — R45851 Suicidal ideations: Secondary | ICD-10-CM | POA: Diagnosis not present

## 2020-03-20 DIAGNOSIS — Z96651 Presence of right artificial knee joint: Secondary | ICD-10-CM | POA: Diagnosis not present

## 2020-03-20 DIAGNOSIS — Z79899 Other long term (current) drug therapy: Secondary | ICD-10-CM | POA: Insufficient documentation

## 2020-03-20 DIAGNOSIS — R443 Hallucinations, unspecified: Secondary | ICD-10-CM | POA: Insufficient documentation

## 2020-03-20 DIAGNOSIS — F1910 Other psychoactive substance abuse, uncomplicated: Secondary | ICD-10-CM | POA: Diagnosis present

## 2020-03-20 DIAGNOSIS — I10 Essential (primary) hypertension: Secondary | ICD-10-CM | POA: Diagnosis not present

## 2020-03-20 DIAGNOSIS — F17210 Nicotine dependence, cigarettes, uncomplicated: Secondary | ICD-10-CM | POA: Diagnosis not present

## 2020-03-20 DIAGNOSIS — F332 Major depressive disorder, recurrent severe without psychotic features: Secondary | ICD-10-CM | POA: Insufficient documentation

## 2020-03-20 DIAGNOSIS — Z96612 Presence of left artificial shoulder joint: Secondary | ICD-10-CM | POA: Diagnosis not present

## 2020-03-20 DIAGNOSIS — F1994 Other psychoactive substance use, unspecified with psychoactive substance-induced mood disorder: Secondary | ICD-10-CM

## 2020-03-20 LAB — CBC
HCT: 37.9 % — ABNORMAL LOW (ref 39.0–52.0)
Hemoglobin: 13 g/dL (ref 13.0–17.0)
MCH: 32.2 pg (ref 26.0–34.0)
MCHC: 34.3 g/dL (ref 30.0–36.0)
MCV: 93.8 fL (ref 80.0–100.0)
Platelets: 279 10*3/uL (ref 150–400)
RBC: 4.04 MIL/uL — ABNORMAL LOW (ref 4.22–5.81)
RDW: 13.1 % (ref 11.5–15.5)
WBC: 13.2 10*3/uL — ABNORMAL HIGH (ref 4.0–10.5)
nRBC: 0 % (ref 0.0–0.2)

## 2020-03-20 LAB — COMPREHENSIVE METABOLIC PANEL
ALT: 26 U/L (ref 0–44)
AST: 60 U/L — ABNORMAL HIGH (ref 15–41)
Albumin: 4.1 g/dL (ref 3.5–5.0)
Alkaline Phosphatase: 67 U/L (ref 38–126)
Anion gap: 7 (ref 5–15)
BUN: 36 mg/dL — ABNORMAL HIGH (ref 6–20)
CO2: 22 mmol/L (ref 22–32)
Calcium: 8.9 mg/dL (ref 8.9–10.3)
Chloride: 108 mmol/L (ref 98–111)
Creatinine, Ser: 1.38 mg/dL — ABNORMAL HIGH (ref 0.61–1.24)
GFR calc Af Amer: >60 mL/min (ref >60)
GFR calc non Af Amer: 56 mL/min — ABNORMAL LOW (ref >60)
Glucose, Bld: 103 mg/dL — ABNORMAL HIGH (ref 70–99)
Potassium: 4.2 mmol/L (ref 3.5–5.1)
Sodium: 137 mmol/L (ref 135–145)
Total Bilirubin: 0.4 mg/dL (ref 0.3–1.2)
Total Protein: 7.1 g/dL (ref 6.5–8.1)

## 2020-03-20 LAB — SALICYLATE LEVEL: Salicylate Lvl: <7 mg/dL — ABNORMAL LOW (ref 7.0–30.0)

## 2020-03-20 LAB — ACETAMINOPHEN LEVEL: Acetaminophen (Tylenol), Serum: <10 ug/mL — ABNORMAL LOW (ref 10–30)

## 2020-03-20 LAB — RAPID URINE DRUG SCREEN, HOSP PERFORMED
Amphetamines: POSITIVE — AB
Barbiturates: NOT DETECTED
Benzodiazepines: NOT DETECTED
Cocaine: POSITIVE — AB
Opiates: NOT DETECTED
Tetrahydrocannabinol: POSITIVE — AB

## 2020-03-20 LAB — ETHANOL: Alcohol, Ethyl (B): <10 mg/dL (ref <10)
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
## 2020-03-20 NOTE — ED Notes (Signed)
Pt requested to use BR before completing anything in triage. Pt states "I'm going to pee my pants. I need to go to the BR before anything. It takes me a minute to pee".

## 2020-03-20 NOTE — ED Triage Notes (Signed)
Patient arrived for a mental health check. Patient is stating he has been depressed and suicidal for 33 years, stating a situation with his sister going to jail is triggering although cannot specify what brought him in today. States he is suicidal but "would never be the person to do anything to himself" Patient appears anxious and unable to clearly answer this nurses questions.

## 2020-03-20 NOTE — ED Notes (Signed)
Pt dressed into scrubs with assist of a male tech due to pt making inappropriate comments to male nursing staff. Pt wanded by security and belongings placed at triage nurses station.

## 2020-03-20 NOTE — ED Notes (Signed)
Pt sitting in room with gloves on and blood pressure cuff rolled up in a threatening manner hitting the blood pressure cuff into his hand. Writer asked pt to remove gloves and encouraged to stop hitting his hand with the blood pressure cuff.

## 2020-03-20 NOTE — ED Provider Notes (Signed)
[facility] DEPT Provider Note   CSN: [id number] Arrival date & time: 03/20/20  1959     History Chief Complaint  Patient presents with  . Suicidal    Joseph Curtis is a 59 y.o. male.  Patient to ED c/o depression, SI, hallucinations. No HI. He states long history of depression and SI. He states his wife told him his psychiatrist, Dr. Toy Care, would be here to see him and packed his bag. He states she "put a lot of drugs in my suitcase". When asked if he had any physical complaints, he states "yes, I have aliens in my mouth sharpening my teeth."  The history is provided by the patient. No language interpreter was used.       Past Medical History:  Diagnosis Date  . Anxiety   . Arthritis   . Back pain    chronic  . Depression   . Fall    from Architect site  . Headache   . Hypertension   . Legg-Calve-Perthes disease, bilateral   . Neck pain    chronic  . Panic attacks   . Pneumonia     Patient Active Problem List   Diagnosis Date Noted  . Anxiety 06/16/2015  . Posttraumatic stress disorder 06/16/2015  . Tardive dyskinesia 06/16/2015  . Chronic pain syndrome 11/30/2014  . Legg-Calve-Perthes disease 11/30/2014  . Panic disorder 11/30/2014    Past Surgical History:  Procedure Laterality Date  . HERNIA REPAIR    . JOINT REPLACEMENT     right knee, left shoulder  . ORCHIECTOMY Left        No family history on file.  Social History   Tobacco Use  . Smoking status: Current Every Day Smoker    Packs/day: 0.50    Years: 30.00    Pack years: 15.00    Types: Cigarettes  . Smokeless tobacco: Never Used  Substance Use Topics  . Alcohol use: Not Currently  . Drug use: Not Currently    Types: Cocaine, Heroin, Marijuana    Comment: states stopped using drugs few months back.    Home Medications Prior to Admission medications   Medication Sig Start Date End Date Taking? Authorizing Provider  alprazolam Duanne Moron) 2 MG tablet  Take 2 mg by mouth 3 (three) times daily as needed for anxiety.     [provider]  ARIPiprazole (ABILIFY) 5 MG tablet Take 5 mg by mouth daily.    [provider]  atorvastatin (LIPITOR) 20 MG tablet Take 20 mg by mouth daily.    [provider]  benztropine (COGENTIN) 1 MG tablet Take 1 tablet (1 mg total) by mouth 2 (two) times daily. Patient not taking: Reported on 06/15/2015 06/06/15   Al Corpus, PA-C  EPINEPHrine 0.3 mg/0.3 mL IJ SOAJ injection Inject 0.3 mLs (0.3 mg total) into the muscle as needed for anaphylaxis. 07/19/19   Lawyer, Harrell Gave, PA-C  FLUoxetine (PROZAC) 40 MG capsule Take 40 mg by mouth daily.    [provider]  gabapentin (NEURONTIN) 600 MG tablet Take 600 mg by mouth 3 (three) times daily.     [provider]  ibuprofen (ADVIL,MOTRIN) 200 MG tablet Take 600 mg by mouth every 6 (six) hours as needed for moderate pain.     [provider]  lisinopril (ZESTRIL) 20 MG tablet Take 20 mg by mouth daily.    [provider]  losartan (COZAAR) 50 MG tablet Take 1 tablet (50 mg total) by mouth daily.  Patient not taking: Reported on 09/22/2019 06/10/19   Antony Madura, PA-C  mirtazapine (REMERON) 15 MG tablet Take 15 mg by mouth at bedtime.    [provider]    Allergies    Bee venom, Penicillins, and Codeine  Review of Systems   Review of Systems  Constitutional: Negative for chills and fever.  HENT: Negative.   Respiratory: Negative.   Cardiovascular: Negative.   Gastrointestinal: Negative.   Musculoskeletal: Negative.   Skin: Negative.   Neurological: Negative.   Psychiatric/Behavioral: Positive for dysphoric mood, hallucinations and suicidal ideas.    Physical Exam Updated Vital Signs BP 107/69 (BP Location: Left Arm)   Pulse (!) 106   Temp 98.6 F (37 C) (Oral)   Resp 18   Ht 5\' 7"  (1.702 m)   Wt 61.2 kg   SpO2 98%   BMI 21.14 kg/m   Physical Exam Vitals and nursing note  reviewed.  Constitutional:      Appearance: He is well-developed.  HENT:     Head: Normocephalic.  Cardiovascular:     Rate and Rhythm: Normal rate and regular rhythm.  Pulmonary:     Effort: Pulmonary effort is normal.     Breath sounds: Normal breath sounds.  Abdominal:     General: Bowel sounds are normal.     Palpations: Abdomen is soft.     Tenderness: There is no abdominal tenderness. There is no guarding or rebound.  Musculoskeletal:        General: Normal range of motion.     Cervical back: Normal range of motion and neck supple.  Skin:    General: Skin is warm and dry.     Findings: No rash.  Neurological:     Mental Status: He is alert and oriented to person, place, and time.     ED Results / Procedures / Treatments   Labs (all labs ordered are listed, but only abnormal results are displayed) Labs Reviewed  RAPID URINE DRUG SCREEN, HOSP PERFORMED - Abnormal; Notable for the following components:      Result Value   Cocaine POSITIVE (*)    Amphetamines POSITIVE (*)    Tetrahydrocannabinol POSITIVE (*)    All other components within normal limits  COMPREHENSIVE METABOLIC PANEL  ETHANOL  SALICYLATE LEVEL  ACETAMINOPHEN LEVEL  CBC   Results for orders placed or performed during the hospital encounter of 03/20/20  Comprehensive metabolic panel  Result Value Ref Range   Sodium 137 135 - 145 mmol/L   Potassium 4.2 3.5 - 5.1 mmol/L   Chloride 108 98 - 111 mmol/L   CO2 22 22 - 32 mmol/L   Glucose, Bld 103 (H) 70 - 99 mg/dL   BUN 36 (H) 6 - 20 mg/dL   Creatinine, Ser 05/20/20 (H) 0.61 - 1.24 mg/dL   Calcium 8.9 8.9 - 9.02 mg/dL   Total Protein 7.1 6.5 - 8.1 g/dL   Albumin 4.1 3.5 - 5.0 g/dL   AST 60 (H) 15 - 41 U/L   ALT 26 0 - 44 U/L   Alkaline Phosphatase 67 38 - 126 U/L   Total Bilirubin 0.4 0.3 - 1.2 mg/dL   GFR calc non Af Amer 56 (L) >60 mL/min   GFR calc Af Amer >60 >60 mL/min   Anion gap 7 5 - 15  Ethanol  Result Value Ref Range   Alcohol, Ethyl (B)  <10 <10 mg/dL  Salicylate level  Result Value Ref Range   Salicylate Lvl <7.0 (L) 7.0 -  30.0 mg/dL  Acetaminophen level  Result Value Ref Range   Acetaminophen (Tylenol), Serum <10 (L) 10 - 30 ug/mL  cbc  Result Value Ref Range   WBC 13.2 (H) 4.0 - 10.5 K/uL   RBC 4.04 (L) 4.22 - 5.81 MIL/uL   Hemoglobin 13.0 13.0 - 17.0 g/dL   HCT 82.9 (L) 93.7 - 16.9 %   MCV 93.8 80.0 - 100.0 fL   MCH 32.2 26.0 - 34.0 pg   MCHC 34.3 30.0 - 36.0 g/dL   RDW 67.8 93.8 - 10.1 %   Platelets 279 150 - 400 K/uL   nRBC 0.0 0.0 - 0.2 %  Rapid urine drug screen (hospital performed)  Result Value Ref Range   Opiates NONE DETECTED NONE DETECTED   Cocaine POSITIVE (A) NONE DETECTED   Benzodiazepines NONE DETECTED NONE DETECTED   Amphetamines POSITIVE (A) NONE DETECTED   Tetrahydrocannabinol POSITIVE (A) NONE DETECTED   Barbiturates NONE DETECTED NONE DETECTED   EKG None  Radiology No results found.  Procedures Procedures (including critical care time)  Medications Ordered in ED Medications - No data to display  ED Course  I have reviewed the triage vital signs and the nursing notes.  Pertinent labs & imaging results that were available during my care of the patient were reviewed by me and considered in my medical decision making (see chart for details).    MDM Rules/Calculators/A&P                      Patient to ED with multiple psychiatric complaints. VSS. He is awake, oriented. Positive drug screen for multiple substances.   He will need TTS consultation to determine disposition.  11:20 - On lab review, he has a mild leukocytosis considered likely stress reaction given +cocaine, and amphetamines. Doubt acute infection. He has a mild AKI, more like dehydration.   He is considered medically cleared for psych consult.  Per TTS - patient will be observed overnight with psych eval in the morning.  Final Clinical Impression(s) / ED Diagnoses Final diagnoses:  None   1. SI 2.  Depression 3. Hallucinations   Rx / DC Orders ED Discharge Orders    None       Danne Harbor 03/21/20 7510    Terrilee Files, MD 03/21/20 1118

## 2020-03-20 NOTE — ED Notes (Signed)
Joseph [facility] (grandson) [phone] Joseph Curtis (wife) [phone]  Per grandson, please call him first.

## 2020-03-20 NOTE — ED Notes (Signed)
Pt ambulated to BR with steady gait.

## 2020-03-21 ENCOUNTER — Encounter (HOSPITAL_COMMUNITY): Payer: Self-pay | Admitting: Registered Nurse

## 2020-03-21 DIAGNOSIS — F1910 Other psychoactive substance abuse, uncomplicated: Secondary | ICD-10-CM | POA: Diagnosis present

## 2020-03-21 DIAGNOSIS — R443 Hallucinations, unspecified: Secondary | ICD-10-CM | POA: Diagnosis not present

## 2020-03-21 DIAGNOSIS — F1994 Other psychoactive substance use, unspecified with psychoactive substance-induced mood disorder: Secondary | ICD-10-CM

## 2020-03-21 NOTE — ED Notes (Signed)
Pt resting comfortably. Pt alert. Pt anxious.

## 2020-03-21 NOTE — [facility] Suicide Risk Assessment (Cosign Needed)
Suicide Risk Assessment  Discharge Assessment   [facility] Discharge Suicide Risk Assessment   Principal Problem: Substance induced mood disorder (HCC) Discharge Diagnoses: Principal Problem:   Substance induced mood disorder (HCC) Active Problems:   Polysubstance abuse (HCC)   Total Time spent with patient: 30 minutes  Musculoskeletal: Strength & Muscle Tone: within normal limits Gait & Station: normal Patient leans: N/A  Psychiatric Specialty Exam:   Blood pressure (!) 115/96, pulse 80, temperature 98.1 F (36.7 C), temperature source Oral, resp. rate 18, height 5\' 7"  (1.702 m), weight 61.2 kg, SpO2 100 %.Body mass index is 21.14 kg/m.  General Appearance: Casual  Eye Contact::  Good  Speech:  Clear and Coherent and Normal Rate409  Volume:  Normal  Mood:  "Okay"  Affect:  Appropriate and Congruent  Thought Process:  Coherent, Goal Directed and Descriptions of Associations: Intact  Orientation:  Full (Time, Place, and Person)  Thought Content:  WDL  Suicidal Thoughts:  No  Homicidal Thoughts:  No  Memory:  Immediate;   Good Recent;   Good  Judgement:  Intact  Insight:  Present  Psychomotor Activity:  Normal  Concentration:  Good  Recall:  Good  Fund of Knowledge:Good  Language: Good  Akathisia:  No  Handed:  Right  AIMS (if indicated):     Assets:  Communication Skills Desire for Improvement Housing Social Support  Sleep:     Cognition: WNL  ADL's:  Intact   Mental Status Per Nursing Assessment::   On Admission:     Demographic Factors:  Male and Caucasian  Loss Factors: NA  Historical Factors: Impulsivity  Risk Reduction Factors:   Sense of responsibility to family, Religious beliefs about death, Living with another person, especially a relative, Positive social support and Positive therapeutic relationship  Continued Clinical Symptoms:  Alcohol/Substance Abuse/Dependencies Previous Psychiatric Diagnoses and Treatments  Cognitive Features That  Contribute To Risk:  None    Suicide Risk:  Minimal: No identifiable suicidal ideation.  Patients presenting with no risk factors but with morbid ruminations; may be classified as minimal risk based on the severity of the depressive symptoms   Plan Of Care/Follow-up recommendations:  Activity:  As tolerated Diet:  Heart healthy  Disposition:  Psychiatrically No evidence of imminent risk to self or others at present.   Patient does not meet criteria for psychiatric inpatient admission. Supportive therapy provided about ongoing stressors. Refer to IOP. Discussed crisis plan, support from social network, calling 911, coming to the Emergency Department, and calling Suicide Hotline.  Stokes Rattigan, NP 03/21/2020, 11:00 AM

## 2020-03-21 NOTE — Patient Outreach (Signed)
ED Peer Support Specialist Patient Intake (Complete at intake & 30-60 Day Follow-up)  Name: Joseph Curtis  MRN: [id number]  Age: 59 y.o.   Date of Admission: 03/21/2020  Intake: Initial Comments:      Primary Reason Admitted: Suicidal ideation Hallucination   Lab values: Alcohol/ETOH: Not completed Positive UDS? 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 No  Number of ED visits:    Reason(s) for visit:    In past year, has patient been hospitalized for any reason?    Number of hospitalizations:    Reason(s) for hospitalization:    In past year, has patient been arrested? No  Number of arrests:    Reason(s) for arrest:    In past year, has patient been incarcerated? No  Number of incarcerations:    Reason(s) for incarceration:    In past year, has patient received medication-assisted treatment? No  In past year, patient received the following treatments: Other (comment)(Receives services with Dr Toy Care)  In past year, has patient received any harm reduction services? No  Did this include any of the following?    In past year, has patient received care from a mental health provider for diagnosis other than SUD? Yes  In past year, is this first time patient has overdosed? No  Number of past overdoses:    In past year, is this first time patient has been hospitalized for an overdose? No  Number of hospitalizations for overdose(s):    Is patient currently receiving  treatment for a mental health diagnosis? Yes  Patient reports experiencing difficulty participating in SUD treatment: No    Most important reason(s) for this difficulty?    Has patient received prior services for treatment? No  In past, patient has received services from following agencies:    Plan of Care:  Suggested follow up at these agencies/treatment centers: (Plans to visit with Dr Toy Care when leaving hospital)  Other information: CPSS met with Pt an was able to process with Pt about what brought Pt into the hospital. CPSS asked Pt if he feels that he was a substance issue, which Pt stated he has not indulged since he was 41 yrs of age. CPSS asked Pt if he wanted to be set up with Outpatient  services. Pt stated that he is fine an that he will follow up with his Primary Dr, Dr Toy Care. CPSS asked Pt if he wanted to referral information for Outpatient services. Pt stated that he was fine an that he will continue to follow up with Dr Toy Care.      Aaron Edelman Kortlyn Koltz, CPSS  03/21/2020 11:05 AM

## 2020-03-21 NOTE — BH Assessment (Addendum)
Tele Assessment Note   Patient Name: MAEJOR ERVEN MRN: [id number] Referring Physician: Elpidio Anis, PA-C. Location of Patient: [facility], [phone]. Location of Provider: Behavioral Health TTS Department  YICHEN GILARDI is an 59 y.o. male, who presents voluntary and unaccompanied to [facility]. Pt was a poor historian during the assessment. Clinician asked the pt, "what brought you to the hospital?" Pt reported, "real depressed, for sometime now." Pt reported, he has been suicidal on and off for a while now. Pt reported, he was suicidal yesterday with no plan. Pt reported, "I'm really tired, I have no desire, I really don't want to die, I have grand kids." Pt reported, he has not self-harmed. Pt is not sure how long it's been since he self-harmed. Pt reported, his mouth is sore and he doesn't know why. Pt reported, access to kitchen knives. Pt denies, SI, HI, AVH, current self-injurious behaviors.    Per EDP/A note: "Patient to ED c/o depression, SI, hallucinations. No HI. He states long history of depression and SI. He states his wife told him his psychiatrist, Dr. Evelene Croon, would be here to see him and packed his bag. He states she "put a lot of drugs in my suitcase". When asked if he had any physical complaints, he states "yes, I have aliens in my mouth sharpening my teeth."  Pt reported, he is linked to [employer] for medication management. Pt could not recall all of his medications prescribed. Pt reported, he has not ben taking his medications correctly. Pt's UDS is positive for amphetamines, cocaine and marijuana. Pt reported, previous inpatient admissions.   Pt presents drowsy in scrubs with logical, coherent speech. Pt's mood was depressed. Pt's affect was flat. Pt's thought process was circumstantial. Pt's judgement was impaired. Pt's concentration was fair. Pt's insight and impulse control are poor. Pt reported, if discharged form [facility] he would not hurt himself or others. Pt reported, if  inpatient is recommended he will sign-in voluntarily.   *Pt reported, his grandson as a support. Clinician asked the pt if there is anyone she can contact to obtain collateral information on his current ED visit. Pt replied, "I don't know."*  Diagnosis: Major Depressive Disorder, recurrent, severe.  Past Medical History:  Past Medical History:  Diagnosis Date  . Anxiety   . Arthritis   . Back pain    chronic  . Depression   . Fall    from Holiday representative site  . Headache   . Hypertension   . Legg-Calve-Perthes disease, bilateral   . Neck pain    chronic  . Panic attacks   . Pneumonia     Past Surgical History:  Procedure Laterality Date  . HERNIA REPAIR    . JOINT REPLACEMENT     right knee, left shoulder  . ORCHIECTOMY Left     Family History: No family history on file.  Social History:  reports that he has been smoking cigarettes. He has a 15.00 pack-year smoking history. He has never used smokeless tobacco. He reports previous alcohol use. He reports previous drug use. Drugs: Cocaine, Heroin, and Marijuana.  Additional Social History:  Alcohol / Drug Use Pain Medications: See MAR Prescriptions: See MAR Over the Counter: See MAR History of alcohol / drug use?: Yes Substance #1 Name of Substance 1: Amphetamines. 1 - Age of First Use: UTA 1 - Amount (size/oz): Pt's UDS is positive for amphetamines. 1 - Frequency: UTA 1 - Duration: UTA 1 - Last Use / Amount: UTA Substance #2  Name of Substance 2: Cocaine. 2 - Age of First Use: UTA 2 - Amount (size/oz): Pt's UDS is positive for coaine. 2 - Frequency: UTA 2 - Duration: UTA 2 - Last Use / Amount: UTA Substance #3 Name of Substance 3: Marijuana. 3 - Age of First Use: UTA 3 - Amount (size/oz): Pt's UDS is positive for marijuana. 3 - Frequency: UTA 3 - Duration: UTA 3 - Last Use / Amount: UTA  CIWA: CIWA-Ar BP: (!) 115/96 Pulse Rate: 80 Nausea and Vomiting: no nausea and no vomiting Tactile Disturbances:  none Tremor: no tremor Auditory Disturbances: not present Paroxysmal Sweats: no sweat visible Visual Disturbances: not present Anxiety: two Headache, Fullness in Head: very mild Agitation: two Orientation and Clouding of Sensorium: oriented and can do serial additions CIWA-Ar Total: 5 COWS:    Allergies:  Allergies  Allergen Reactions  . Bee Venom Anaphylaxis  . Penicillins Anaphylaxis, Hives and Rash    Did it involve swelling of the face/tongue/throat, SOB, or low BP? No Did it involve sudden or severe rash/hives, skin peeling, or any reaction on the inside of your mouth or nose? No Did you need to seek medical attention at a hospital or doctor's office? Yes When did it last happen?childhood allergy If all above answers are "NO", may proceed with cephalosporin use.   . Codeine Other (See Comments)    "My feet itch, burn--I can't stand it."    Home Medications: (Not in a hospital admission)   OB/GYN Status:  No LMP for male patient.  General Assessment Data Location of Assessment: [facility] TTS Assessment: In system Is this a Tele or Face-to-Face Assessment?: Tele Assessment Is this an Initial Assessment or a Re-assessment for this encounter?: Initial Assessment Patient Accompanied by:: N/A Language Other than English: No Living Arrangements: Other (Comment)(Wife.) What gender do you identify as?: Male Marital status: Married Living Arrangements: Spouse/significant other Can pt return to current living arrangement?: Yes Admission Status: Voluntary Is patient capable of signing voluntary admission?: Yes Referral Source: Self/Family/Friend Insurance type: [employer].      Crisis Care Plan Living Arrangements: Spouse/significant other Legal Guardian: Other:(Self. ) Name of Psychiatrist: Dr. Milagros Evener. Name of Therapist: NA  Education Status Is patient currently in school?: No Is the patient employed, unemployed or receiving disability?:  Receiving disability income  Risk to self with the past 6 months Suicidal Ideation: No-Not Currently/Within Last 6 Months Has patient been a risk to self within the past 6 months prior to admission? : Yes Suicidal Intent: No Has patient had any suicidal intent within the past 6 months prior to admission? : No Is patient at risk for suicide?: Yes Suicidal Plan?: No(Pt denies. ) Has patient had any suicidal plan within the past 6 months prior to admission? : No Access to Means: Yes Specify Access to Suicidal Means: Knives. What has been your use of drugs/alcohol within the last 12 months?: Amphetamines, cocaine and marijuana.  Previous Attempts/Gestures: Yes How many times?: (Pt reported, last time was years ago. ) Other Self Harm Risks: SI, history of self-harm, AVH. Triggers for Past Attempts: Unknown Intentional Self Injurious Behavior: None(Pt reported, not lately. ) Family Suicide History: Unable to assess Recent stressful life event(s): (UTA) Persecutory voices/beliefs?: (UTA) Depression: Yes Depression Symptoms: Feeling angry/irritable, Feeling worthless/self pity, Fatigue, Loss of interest in usual pleasures, Guilt, Isolating, Tearfulness, Despondent, Insomnia Substance abuse history and/or treatment for substance abuse?: Yes Suicide prevention information given to non-admitted patients: Not applicable  Risk to Others  within the past 6 months Homicidal Ideation: No(Pt denies. ) Does patient have any lifetime risk of violence toward others beyond the six months prior to admission? : No Thoughts of Harm to Others: No Current Homicidal Intent: No Current Homicidal Plan: No Access to Homicidal Means: No Identified Victim: NA History of harm to others?: No Assessment of Violence: None Noted Violent Behavior Description: NA Does patient have access to weapons?: No Criminal Charges Pending?: No Does patient have a court date: No Is patient on probation?:  No  Psychosis Hallucinations: None noted Delusions: Unspecified(Per EDP note. )  Mental Status Report Appearance/Hygiene: In scrubs Eye Contact: Poor Motor Activity: Unremarkable Speech: Logical/coherent Level of Consciousness: Drowsy Mood: Depressed Affect: Flat Anxiety Level: None Thought Processes: Circumstantial Judgement: Impaired Orientation: Person, Place, Time, Situation Obsessive Compulsive Thoughts/Behaviors: None  Cognitive Functioning Concentration: Fair Memory: Recent Impaired Is patient IDD: No Insight: Poor Impulse Control: Poor Appetite: (UTA) Sleep: Decreased Total Hours of Sleep: 3 Vegetative Symptoms: Staying in bed  ADLScreening [facility] Assessment Services) Patient's cognitive ability adequate to safely complete daily activities?: Yes Patient able to express need for assistance with ADLs?: Yes Independently performs ADLs?: Yes (appropriate for developmental age)  Prior Inpatient Therapy Prior Inpatient Therapy: Yes Prior Therapy Dates: UTA Prior Therapy Facilty/Provider(s): UTA Reason for Treatment: UTA  Prior Outpatient Therapy Prior Outpatient Therapy: Yes Prior Therapy Dates: Current. Prior Therapy Facilty/Provider(s): Dr. Chucky May.  Reason for Treatment: Medication management. Does patient have an ACCT team?: No Does patient have Intensive In-House Services?  : No Does patient have [facility] services? : No Does patient have P4CC services?: No  ADL Screening (condition at time of admission) Patient's cognitive ability adequate to safely complete daily activities?: Yes Is the patient deaf or have difficulty hearing?: No Does the patient have difficulty seeing, even when wearing glasses/contacts?: No Does the patient have difficulty concentrating, remembering, or making decisions?: Yes Patient able to express need for assistance with ADLs?: Yes Does the patient have difficulty dressing or bathing?: No Independently performs ADLs?: Yes  (appropriate for developmental age) Does the patient have difficulty walking or climbing stairs?: No Weakness of Legs: None(Pain in mid section.) Weakness of Arms/Hands: None  Home Assistive Devices/Equipment Home Assistive Devices/Equipment: (Reading glasses.)    Abuse/Neglect Assessment (Assessment to be complete while patient is alone) Abuse/Neglect Assessment Can Be Completed: Unable to assess, patient is non-responsive or altered mental status     Advance Directives (For Healthcare) Does Patient Have a Medical Advance Directive?: No Would patient like information on creating a medical advance directive?: No - Patient declined          Disposition: Lindon Romp, NP recommends observed and reassessed by psychiatry. Disposition discussed Nehemiah Settle, [place] and Mechele Claude, Therapist, sports.   Disposition Initial Assessment Completed for this Encounter: Yes  This service was provided via telemedicine using a 2-way, interactive audio and video technology.  Names of all persons participating in this telemedicine service and their role in this encounter. Name: TAJON MORING. Role: Patient.  Name: Vertell Novak, MS, [facility], [place]. Role: Counselor.          Vertell Novak 03/21/2020 6:16 AM    Vertell Novak, MS, [facility], [place] Triage Specialist [phone]

## 2020-03-21 NOTE — BH Assessment (Signed)
[facility] Assessment Progress Note  Per Shuvon Rankin, FNP, this pt does not require psychiatric hospitalization at this time.  Pt is to be discharged from [facility].  A peer support consult has been ordered for pt, and pt has been seen by Arlys John Degraphenreid, CPPS.  Pt denies any substance use problems and is therefore not interested in treatment.  Discharge instructions advise pt to continue treatment with Milagros Evener, MD.  Pt's nurse, Beth, has been notified.  Doylene Canning, MA Triage Specialist [phone]

## 2020-03-21 NOTE — Progress Notes (Signed)
Received Joseph Curtis from the main ED at 2350 hrs with the technician and security. He was oriented to his room and received a snack per his request. He slept throughout the night.

## 2020-03-21 NOTE — Discharge Instructions (Signed)
For your behavioral health needs, you are advised to continue treatment with Rupinder Kaur, MD: ° °     Rupinder Kaur, MD °     [address] °     [place], [place] [address] °     [phone] °

## 2020-03-21 NOTE — ED Notes (Signed)
Pt DCd off unit to home per provider. Pt alert, calm, cooperative, no s/s of distress. DC information given to and reviewed with pt, pt acknowledged understanding. Belongings given to pt. Pt ambulatory off the unit, escorted by NT. Pt wife upset pt is being DCd .  Pt transported by family.

## 2020-05-18 DIAGNOSIS — Z20822 Contact with and (suspected) exposure to covid-19: Secondary | ICD-10-CM | POA: Diagnosis not present

## 2020-06-30 ENCOUNTER — Encounter (HOSPITAL_COMMUNITY): Payer: Self-pay | Admitting: *Deleted

## 2020-06-30 ENCOUNTER — Other Ambulatory Visit: Payer: Self-pay

## 2020-06-30 ENCOUNTER — Emergency Department (HOSPITAL_COMMUNITY)
Admission: EM | Admit: 2020-06-30 | Discharge: 2020-06-30 | Disposition: A | Discharge status: Home / Self Care | Payer: Medicare Other | Attending: Emergency Medicine | Admitting: Emergency Medicine

## 2020-06-30 DIAGNOSIS — R061 Stridor: Secondary | ICD-10-CM | POA: Diagnosis not present

## 2020-06-30 DIAGNOSIS — E860 Dehydration: Secondary | ICD-10-CM | POA: Diagnosis not present

## 2020-06-30 DIAGNOSIS — Z87892 Personal history of anaphylaxis: Secondary | ICD-10-CM | POA: Diagnosis not present

## 2020-06-30 DIAGNOSIS — I10 Essential (primary) hypertension: Secondary | ICD-10-CM | POA: Diagnosis not present

## 2020-06-30 DIAGNOSIS — R410 Disorientation, unspecified: Secondary | ICD-10-CM | POA: Diagnosis not present

## 2020-06-30 DIAGNOSIS — T63441A Toxic effect of venom of bees, accidental (unintentional), initial encounter: Secondary | ICD-10-CM | POA: Insufficient documentation

## 2020-06-30 DIAGNOSIS — Z79899 Other long term (current) drug therapy: Secondary | ICD-10-CM | POA: Diagnosis not present

## 2020-06-30 DIAGNOSIS — F17210 Nicotine dependence, cigarettes, uncomplicated: Secondary | ICD-10-CM | POA: Diagnosis not present

## 2020-06-30 DIAGNOSIS — Z91030 Bee allergy status: Secondary | ICD-10-CM | POA: Diagnosis not present

## 2020-06-30 DIAGNOSIS — Z743 Need for continuous supervision: Secondary | ICD-10-CM | POA: Diagnosis not present

## 2020-06-30 DIAGNOSIS — R404 Transient alteration of awareness: Secondary | ICD-10-CM | POA: Diagnosis not present

## 2020-06-30 MED ORDER — EPINEPHRINE 0.3 MG/0.3ML IJ SOAJ: 0.3000 mg | INTRAMUSCULAR | 2 refills | Status: AC | PRN

## 2020-06-30 MED ORDER — PREDNISONE 10 MG PO TABS: 20.0000 mg | ORAL_TABLET | Freq: Every day | ORAL | 0 refills | Status: AC

## 2020-06-30 NOTE — ED Triage Notes (Signed)
Pt from home by EMS after a bee sting. 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 Pt reports one bee sting to RLQ, redness noted to area. Pt used 4 epi pens ( dropped 1 epi onto the ground, 2 didn't fully inject into leg), but believes only one may have injected medication. EMS gave 50mg  benadryl IV

## 2020-06-30 NOTE — ED Notes (Signed)
Pt refused to sign AMA paper and discharge vitals

## 2020-06-30 NOTE — ED Notes (Signed)
Pt came out of room and states, "my wife is broken down on the side of a highway, I have to go." Rn removed pt's IV. Could not find Dr. Dalene Seltzer but notified Dr. Clayborne Dana.

## 2020-06-30 NOTE — ED Provider Notes (Signed)
[facility] EMERGENCY DEPARTMENT Provider Note   CSN: [id number] Arrival date & time: 06/30/20  1918     History Chief Complaint  Patient presents with  . Allergic Reaction    Joseph Curtis is a 59 y.o. male.  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  Reports he was stung in his RLQ and given his history began taking epi pen prior to developing any symptoms.   He ended up firing 4 epi pens but reports getting only one epi pen injected.  (others malfunctioned or were dropped)  Also took 50mg  benadryl and was given 125mg  solumedrol by EMS.     Has area to RLQ with local reaction but no other rash, no nausea/vomiting/diarrhea/abd pain/lightheadedness/shortness of breath/swelling of oropharynx, difficulty speaking, difficulty swallowing.    Past Medical History:  Diagnosis Date  . Anxiety   . Arthritis   . Back pain    chronic  . Depression   . Fall    from site  . Headache   . Hypertension   . Legg-Calve-Perthes disease, bilateral   . Neck pain    chronic  . Panic attacks   . Pneumonia     Patient Active Problem List   Diagnosis Date Noted  . Polysubstance abuse (HCC) 03/21/2020  . Substance induced mood disorder (HCC) 03/21/2020  . Anxiety 06/16/2015  . Posttraumatic stress disorder 06/16/2015  . Tardive dyskinesia 06/16/2015  . Chronic pain syndrome 11/30/2014  . Legg-Calve-Perthes disease 11/30/2014  . Panic disorder 11/30/2014    Past Surgical History:  Procedure Laterality Date  . HERNIA REPAIR    . JOINT REPLACEMENT     right knee, left shoulder  . ORCHIECTOMY Left        No family history on file.  Social History   Tobacco Use  . Smoking status: Current Every Day Smoker    Packs/day: 0.50    Years: 30.00    Pack years: 15.00    Types:  Cigarettes  . Smokeless tobacco: Never Used  Vaping Use  . Vaping Use: Never used  Substance Use Topics  . Alcohol use: Not Currently  . Drug use: Not Currently    Types: Cocaine, Heroin, Marijuana    Comment: states stopped using drugs few months back.    Home Medications Prior to Admission medications   Medication Sig Start Date End Date Taking? Authorizing Provider  alprazolam 12/02/2014) 2 MG tablet Take 2 mg by mouth 3 (three) times daily. 05/31/20  Yes [provider]  ARIPiprazole (ABILIFY) 5 MG tablet Take 5 mg by mouth at bedtime.    Yes [provider]  Aspirin-Salicylamide-Caffeine (BC HEADACHE POWDER PO) Take 1 packet by mouth as needed (for pain).   Yes [provider]  atorvastatin (LIPITOR) 20 MG tablet Take 20 mg by mouth at bedtime.    Yes [provider]  diphenhydrAMINE (BENADRYL) 25 MG tablet Take 25-100 mg by mouth as needed (for allergic reactions).    Yes [provider]  EPINEPHrine 0.3 mg/0.3 mL IJ SOAJ injection Inject 0.3 mLs (0.3 mg total) into the muscle as needed for anaphylaxis. Patient taking differently: Inject 0.3-0.6 mg into the muscle as needed for anaphylaxis.  07/19/19  Yes Lawyer, 06/02/20, PA-C  FLUoxetine (PROZAC) 40 MG capsule Take 40 mg by mouth daily.  Yes [provider]  gabapentin (NEURONTIN) 600 MG tablet Take 600 mg by mouth 3 (three) times daily.    Yes [provider]  ibuprofen (ADVIL,MOTRIN) 200 MG tablet Take 600 mg by mouth every 6 (six) hours as needed (for pain or headaches).    Yes [provider]  lisinopril (ZESTRIL) 20 MG tablet Take 20 mg by mouth daily.   Yes [provider]  mirtazapine (REMERON) 15 MG tablet Take 15 mg by mouth at bedtime.   Yes [provider]  benztropine (COGENTIN) 1 MG tablet Take 1 tablet (1 mg total) by mouth 2 (two) times daily. Patient not taking: Reported on 06/30/2020 06/06/15   Oswaldo Conroy, PA-C  EPINEPHrine  0.3 mg/0.3 mL IJ SOAJ injection Inject 0.3 mLs (0.3 mg total) into the muscle as needed for anaphylaxis. 06/30/20   Alvira Monday, MD  hydrOXYzine (ATARAX/VISTARIL) 50 MG tablet Take 100 mg by mouth every 6 (six) hours as needed for anxiety. Patient not taking: Reported on 06/30/2020 05/18/20   [provider]  predniSONE (DELTASONE) 10 MG tablet Take 2 tablets (20 mg total) by mouth daily for 3 days. 06/30/20 07/03/20  Alvira Monday, MD    Allergies    Bee venom, Penicillins, and Codeine  Review of Systems   Review of Systems  Constitutional: Negative for fever.  HENT: Negative for sore throat, trouble swallowing and voice change.   Eyes: Negative for visual disturbance.  Respiratory: Negative for cough and shortness of breath.   Cardiovascular: Negative for chest pain.  Gastrointestinal: Negative for abdominal pain, diarrhea, nausea and vomiting.  Genitourinary: Negative for difficulty urinating.  Musculoskeletal: Negative for back pain.  Skin: Positive for wound (sting). Negative for rash.  Neurological: Negative for syncope and headaches.    Physical Exam Updated Vital Signs BP (!) 184/95   Pulse 72   Temp 98 F (36.7 C) (Oral)   Resp (!) 26   Ht 5\' 7"  (1.702 m)   Wt 61.2 kg   SpO2 100%   BMI 21.13 kg/m   Physical Exam Vitals and nursing note reviewed.  Constitutional:      General: He is not in acute distress.    Appearance: He is well-developed. He is not diaphoretic.  HENT:     Head: Normocephalic and atraumatic.  Eyes:     Conjunctiva/sclera: Conjunctivae normal.  Cardiovascular:     Rate and Rhythm: Normal rate and regular rhythm.     Heart sounds: Normal heart sounds. No murmur heard.  No friction rub. No gallop.   Pulmonary:     Effort: Pulmonary effort is normal. No respiratory distress.     Breath sounds: Normal breath sounds. No wheezing or rales.  Abdominal:     General: There is no distension.     Palpations: Abdomen is soft.      Tenderness: There is no abdominal tenderness. There is no guarding.  Musculoskeletal:     Cervical back: Normal range of motion.  Skin:    General: Skin is warm and dry.     Comments: Welt RLQ central sting  Neurological:     Mental Status: He is alert and oriented to person, place, and time.     ED Results / Procedures / Treatments   Labs (all labs ordered are listed, but only abnormal results are displayed) Labs Reviewed - No data to display  EKG None  Radiology No results found.  Procedures Procedures (including critical care time)  Medications Ordered in ED Medications -  No data to display  ED Course  I have reviewed the triage vital signs and the nursing notes.  Pertinent labs & imaging results that were available during my care of the patient were reviewed by me and considered in my medical decision making (see chart for details).    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  Received epi, solumedrol, and benadryl prior to arrival. Did not have symptoms of anaphylaxis prior to administering epi pen but does have significant history of severe anaphylaxis in past.  Given this, recommended observation for 4 hours in the ED. Observed without development of anaphylaxis. He left at 4 hours prior to my discussion with him but assume at that time he was not developing worsening symptoms. Called phone number he recommended we call to pass on message regarding sending rx to pharmacy. Wrote rx for epi pen and prednisone.     Final Clinical Impression(s) / ED Diagnoses Final diagnoses:  Bee sting, accidental or unintentional, initial encounter    Rx / DC Orders ED Discharge Orders         Ordered    EPINEPHrine 0.3 mg/0.3 mL IJ SOAJ injection  As needed      Discontinue  Reprint     06/30/20 2338    predniSONE (DELTASONE) 10 MG tablet  Daily     Discontinue  Reprint     06/30/20 0938           Alvira Monday, MD 07/01/20 1044

## 2021-01-14 IMAGING — CT CT HEAD WITHOUT CONTRAST
4 series · 17 of 47 positions shown, 19 images · non-contrast
Comparison: None.

CLINICAL DATA: Weakness and speech difficulty for 1 week.

EXAM:
CT HEAD WITHOUT CONTRAST
TECHNIQUE: Contiguous axial images were obtained from the base of the skull
through the vertex without intravenous contrast.

[Series 3: head without · axial · non-contrast · 0.42mm/px · z∈[-97,+38]mm · 7 of 37 slices shown, 9 images]
[im 5/37  brain]
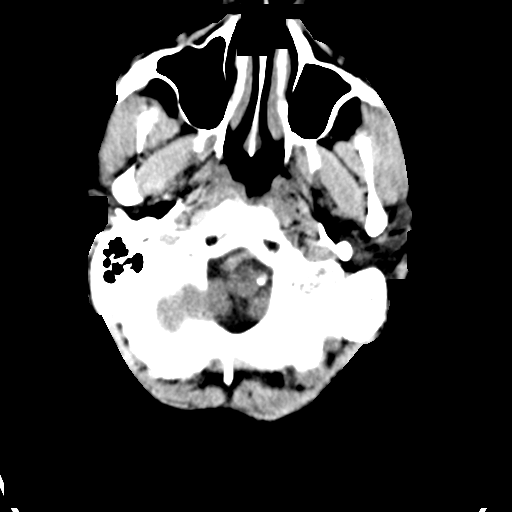
[im 5/37  bone]
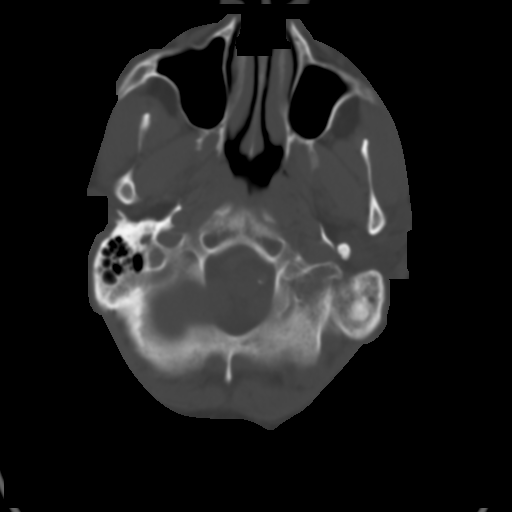
[im 10/37  brain]
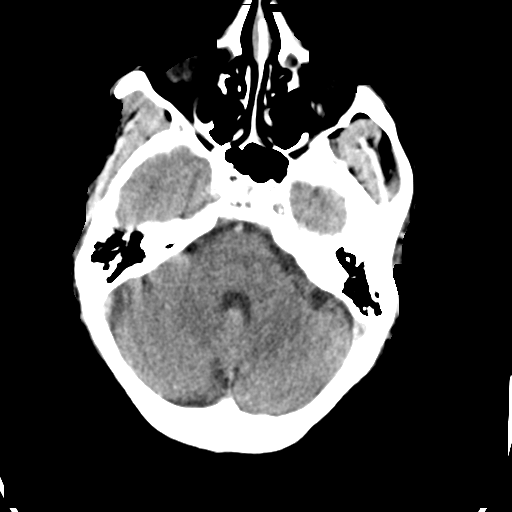
[im 14/37  brain]
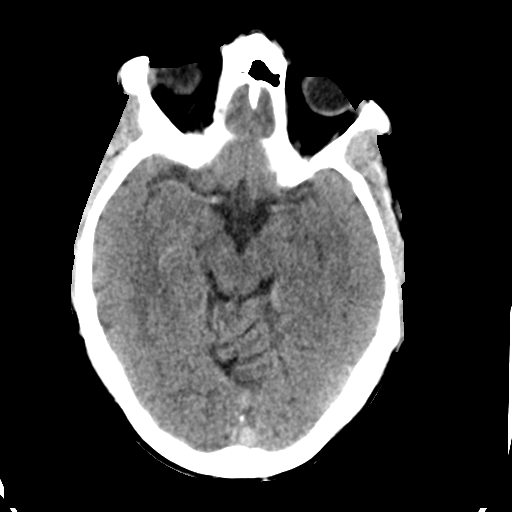
[im 19/37  brain]
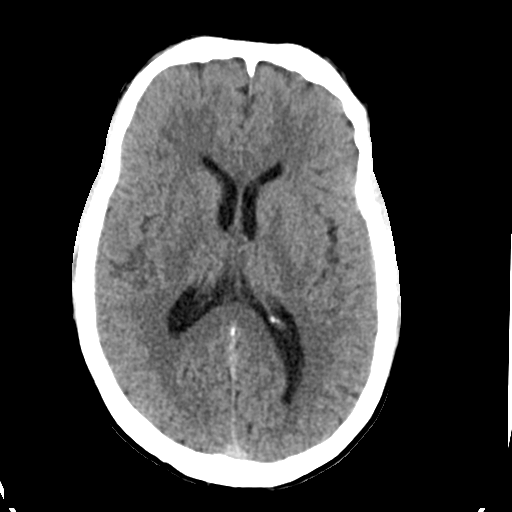
[im 23/37  brain]
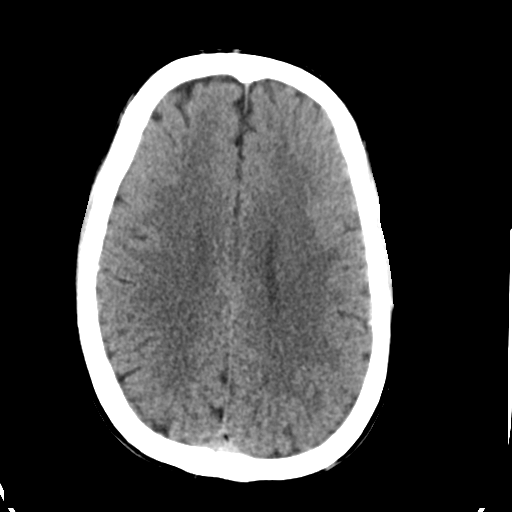
[im 23/37  bone]
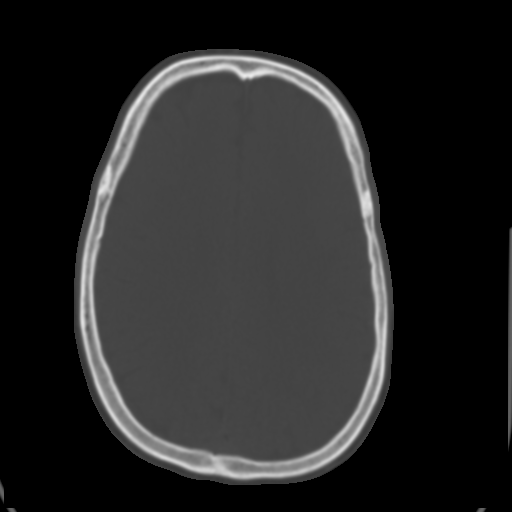
[im 28/37  brain]
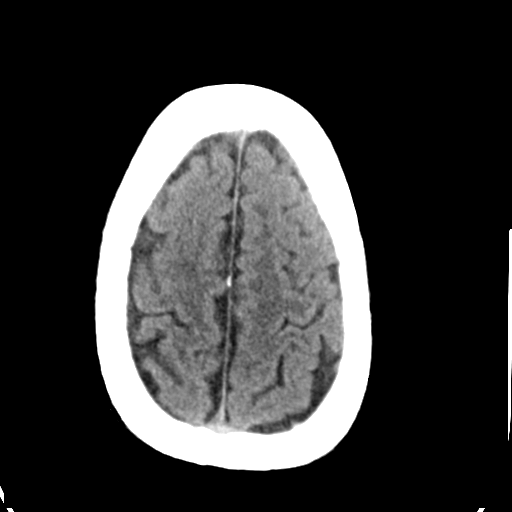
[im 32/37  brain]
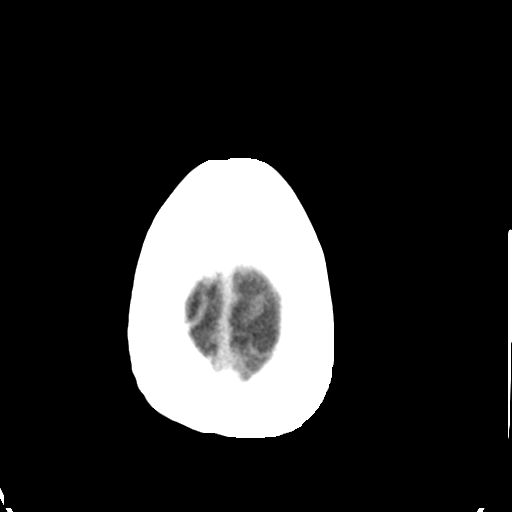

[Series 4: head bone · axial · 0.42mm/px · z∈[-99,-37]mm · 4 of 91 slices shown]
[im 10/91  bone]
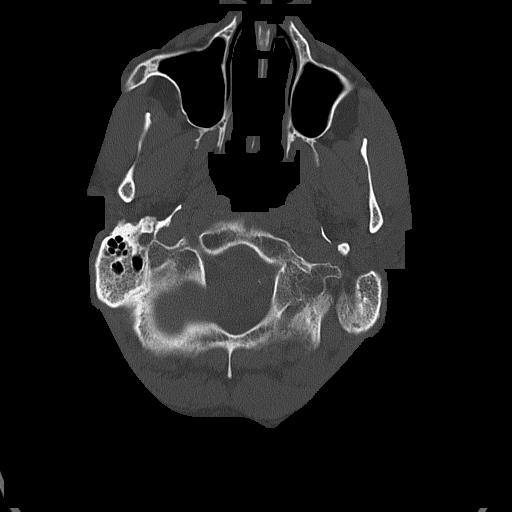
[im 19/91  bone]
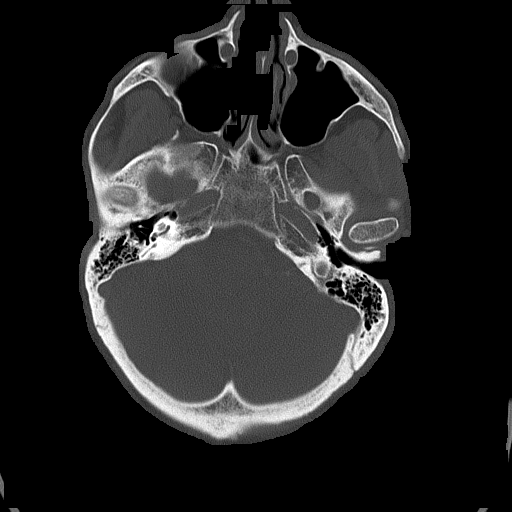
[im 28/91  bone]
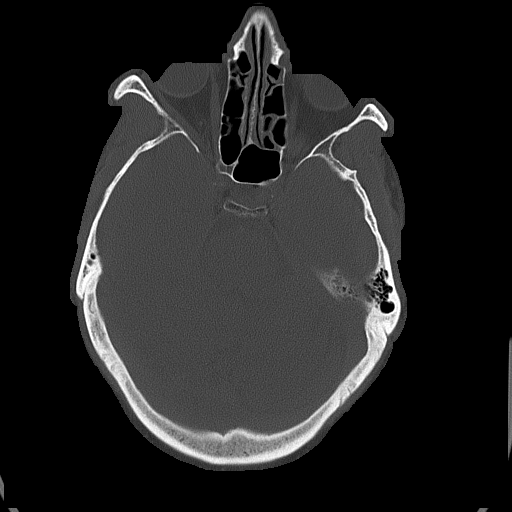
[im 41/91  bone]
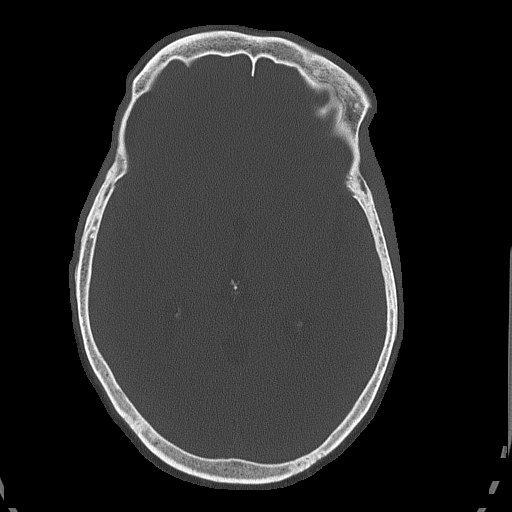

[Series 5: head without cor · coronal · non-contrast · 0.34mm/px · 3 of 75 slices shown]
[im 25/75  brain]
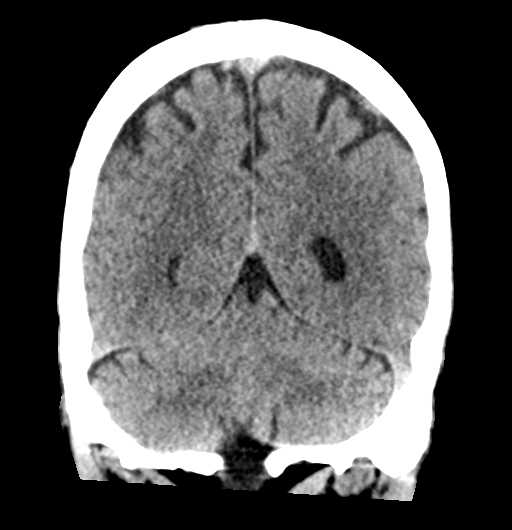
[im 33/75  brain]
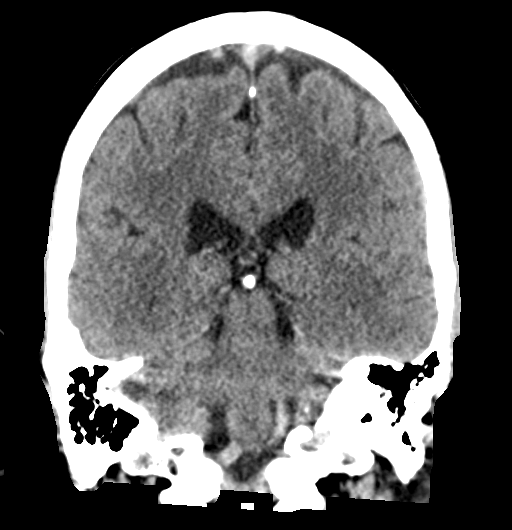
[im 42/75  brain]
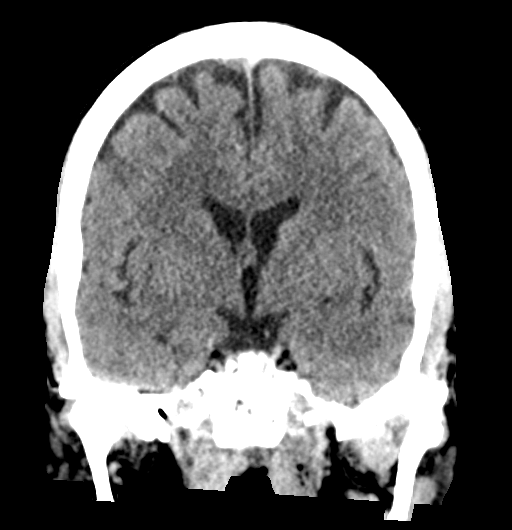

[Series 6: head without sag · sagittal · non-contrast · 0.38mm/px · 3 of 64 slices shown]
[im 22/64  brain]
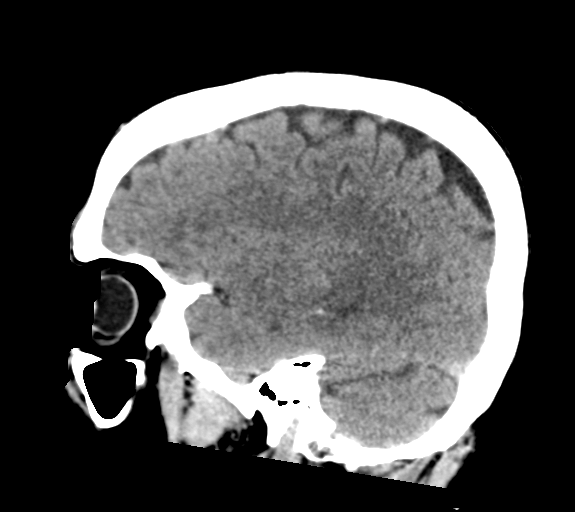
[im 32/64  brain]
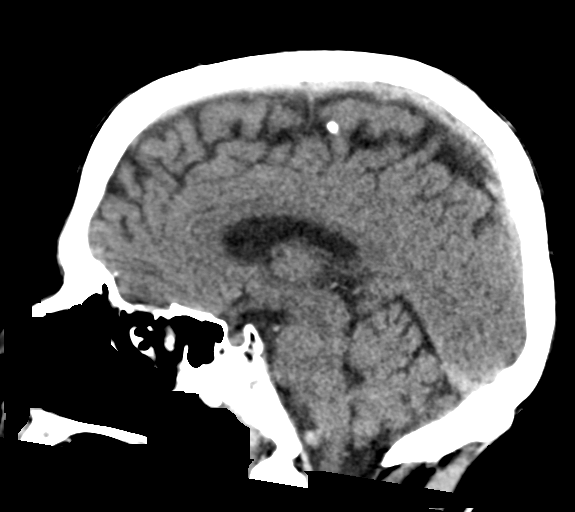
[im 43/64  brain]
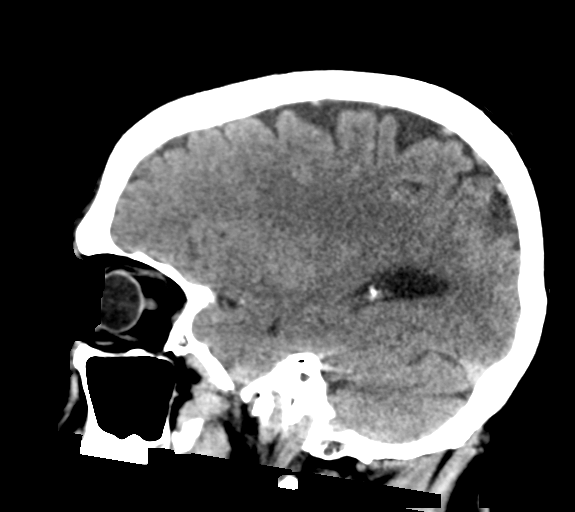

[17 of 47 positions shown; findings below may reference images not displayed]

FINDINGS: Brain: There is no mass, hemorrhage or extra-axial collection. The
size and configuration of the ventricles and extra-axial CSF spaces
are normal. The brain parenchyma is normal, without acute or chronic
infarction.

Vascular: No abnormal hyperdensity of the major intracranial
arteries or dural venous sinuses. No intracranial atherosclerosis.

Skull: The visualized skull base, calvarium and extracranial soft
tissues are normal.

Sinuses/Orbits: No fluid levels or advanced mucosal thickening of
the visualized paranasal sinuses. No mastoid or middle ear effusion.
The orbits are normal.
IMPRESSION: Normal head CT.

## 2021-01-23 DIAGNOSIS — K4090 Unilateral inguinal hernia, without obstruction or gangrene, not specified as recurrent: Secondary | ICD-10-CM | POA: Diagnosis not present

## 2021-01-23 DIAGNOSIS — E785 Hyperlipidemia, unspecified: Secondary | ICD-10-CM | POA: Diagnosis not present

## 2021-01-23 DIAGNOSIS — I10 Essential (primary) hypertension: Secondary | ICD-10-CM | POA: Diagnosis not present

## 2021-01-23 DIAGNOSIS — Z6824 Body mass index (BMI) 24.0-24.9, adult: Secondary | ICD-10-CM | POA: Diagnosis not present

## 2021-01-23 DIAGNOSIS — R110 Nausea: Secondary | ICD-10-CM | POA: Diagnosis not present

## 2022-01-20 ENCOUNTER — Encounter (HOSPITAL_COMMUNITY): Payer: Self-pay

## 2022-01-20 ENCOUNTER — Emergency Department (HOSPITAL_COMMUNITY): Payer: Medicare Other

## 2022-01-20 ENCOUNTER — Other Ambulatory Visit (INDEPENDENT_AMBULATORY_CARE_PROVIDER_SITE_OTHER)
Admission: EM | Admit: 2022-01-20 | Discharge: 2022-01-21 | Disposition: A | Discharge status: Home / Self Care | Payer: Medicare Other | Source: Home / Self Care | Admitting: Physician Assistant

## 2022-01-20 ENCOUNTER — Encounter (HOSPITAL_COMMUNITY): Payer: Self-pay | Admitting: Physician Assistant

## 2022-01-20 ENCOUNTER — Emergency Department (HOSPITAL_COMMUNITY)
Admission: EM | Admit: 2022-01-20 | Discharge: 2022-01-20 | Disposition: A | Discharge status: Nursing Home (Includes ICF) | Payer: Medicare Other | Source: Home / Self Care | Attending: Emergency Medicine | Admitting: Emergency Medicine

## 2022-01-20 ENCOUNTER — Other Ambulatory Visit: Payer: Self-pay

## 2022-01-20 DIAGNOSIS — R45851 Suicidal ideations: Secondary | ICD-10-CM | POA: Insufficient documentation

## 2022-01-20 DIAGNOSIS — R051 Acute cough: Secondary | ICD-10-CM | POA: Insufficient documentation

## 2022-01-20 DIAGNOSIS — Z5902 Unsheltered homelessness: Secondary | ICD-10-CM | POA: Insufficient documentation

## 2022-01-20 DIAGNOSIS — R0981 Nasal congestion: Secondary | ICD-10-CM | POA: Insufficient documentation

## 2022-01-20 DIAGNOSIS — Z20822 Contact with and (suspected) exposure to covid-19: Secondary | ICD-10-CM | POA: Insufficient documentation

## 2022-01-20 DIAGNOSIS — I10 Essential (primary) hypertension: Secondary | ICD-10-CM | POA: Insufficient documentation

## 2022-01-20 DIAGNOSIS — F333 Major depressive disorder, recurrent, severe with psychotic symptoms: Secondary | ICD-10-CM | POA: Diagnosis not present

## 2022-01-20 DIAGNOSIS — Z9151 Personal history of suicidal behavior: Secondary | ICD-10-CM | POA: Insufficient documentation

## 2022-01-20 DIAGNOSIS — F4310 Post-traumatic stress disorder, unspecified: Secondary | ICD-10-CM | POA: Insufficient documentation

## 2022-01-20 DIAGNOSIS — R059 Cough, unspecified: Secondary | ICD-10-CM | POA: Diagnosis not present

## 2022-01-20 DIAGNOSIS — R062 Wheezing: Secondary | ICD-10-CM | POA: Insufficient documentation

## 2022-01-20 DIAGNOSIS — Z79899 Other long term (current) drug therapy: Secondary | ICD-10-CM | POA: Insufficient documentation

## 2022-01-20 DIAGNOSIS — R6883 Chills (without fever): Secondary | ICD-10-CM | POA: Insufficient documentation

## 2022-01-20 LAB — CBC WITH DIFFERENTIAL/PLATELET
Abs Immature Granulocytes: 0.02 10*3/uL (ref 0.00–0.07)
Basophils Absolute: 0 10*3/uL (ref 0.0–0.1)
Basophils Relative: 0 %
Eosinophils Absolute: 0.2 10*3/uL (ref 0.0–0.5)
Eosinophils Relative: 2 %
HCT: 39.8 % (ref 39.0–52.0)
Hemoglobin: 13.5 g/dL (ref 13.0–17.0)
Immature Granulocytes: 0 %
Lymphocytes Relative: 36 %
Lymphs Abs: 3.4 10*3/uL (ref 0.7–4.0)
MCH: 31.3 pg (ref 26.0–34.0)
MCHC: 33.9 g/dL (ref 30.0–36.0)
MCV: 92.1 fL (ref 80.0–100.0)
Monocytes Absolute: 1 10*3/uL (ref 0.1–1.0)
Monocytes Relative: 11 %
Neutro Abs: 4.9 10*3/uL (ref 1.7–7.7)
Neutrophils Relative %: 51 %
Platelets: 398 10*3/uL (ref 150–400)
RBC: 4.32 MIL/uL (ref 4.22–5.81)
RDW: 12.8 % (ref 11.5–15.5)
WBC: 9.5 10*3/uL (ref 4.0–10.5)
nRBC: 0 % (ref 0.0–0.2)

## 2022-01-20 LAB — POCT URINE DRUG SCREEN - MANUAL ENTRY (I-SCREEN)
POC Amphetamine UR: NOT DETECTED
POC Buprenorphine (BUP): NOT DETECTED
POC Cocaine UR: POSITIVE — AB
POC Marijuana UR: POSITIVE — AB
POC Methadone UR: NOT DETECTED
POC Methamphetamine UR: NOT DETECTED
POC Morphine: NOT DETECTED
POC Oxazepam (BZO): POSITIVE — AB
POC Oxycodone UR: NOT DETECTED
POC Secobarbital (BAR): NOT DETECTED

## 2022-01-20 LAB — COMPREHENSIVE METABOLIC PANEL
ALT: 15 U/L (ref 0–44)
AST: 19 U/L (ref 15–41)
Albumin: 3.6 g/dL (ref 3.5–5.0)
Alkaline Phosphatase: 72 U/L (ref 38–126)
Anion gap: 7 (ref 5–15)
BUN: 23 mg/dL — ABNORMAL HIGH (ref 6–20)
CO2: 25 mmol/L (ref 22–32)
Calcium: 8.7 mg/dL — ABNORMAL LOW (ref 8.9–10.3)
Chloride: 103 mmol/L (ref 98–111)
Creatinine, Ser: 1.02 mg/dL (ref 0.61–1.24)
GFR, Estimated: >60 mL/min (ref >60)
Glucose, Bld: 94 mg/dL (ref 70–99)
Potassium: 3.1 mmol/L — ABNORMAL LOW (ref 3.5–5.1)
Sodium: 135 mmol/L (ref 135–145)
Total Bilirubin: 0.1 mg/dL — ABNORMAL LOW (ref 0.3–1.2)
Total Protein: 7.1 g/dL (ref 6.5–8.1)

## 2022-01-20 LAB — LIPID PANEL
Cholesterol: 199 mg/dL (ref 0–200)
HDL: 45 mg/dL (ref >40)
LDL Cholesterol: 140 mg/dL — ABNORMAL HIGH (ref 0–99)
Total CHOL/HDL Ratio: 4.4 RATIO
Triglycerides: 70 mg/dL (ref <150)
VLDL: 14 mg/dL (ref 0–40)

## 2022-01-20 LAB — HEMOGLOBIN A1C
Hgb A1c MFr Bld: 5.1 % (ref 4.8–5.6)
Mean Plasma Glucose: 99.67 mg/dL

## 2022-01-20 LAB — TSH: TSH: 0.795 u[IU]/mL (ref 0.350–4.500)

## 2022-01-20 LAB — RESP PANEL BY RT-PCR (FLU A&B, COVID) ARPGX2
Influenza A by PCR: NEGATIVE
Influenza B by PCR: NEGATIVE
SARS Coronavirus 2 by RT PCR: NEGATIVE

## 2022-01-20 LAB — ACETAMINOPHEN LEVEL: Acetaminophen (Tylenol), Serum: <10 ug/mL — ABNORMAL LOW (ref 10–30)

## 2022-01-20 LAB — SALICYLATE LEVEL: Salicylate Lvl: <7 mg/dL — ABNORMAL LOW (ref 7.0–30.0)

## 2022-01-20 LAB — ETHANOL: Alcohol, Ethyl (B): <10 mg/dL (ref <10)

## 2022-01-20 MED ORDER — BENZTROPINE MESYLATE 1 MG PO TABS
1.0000 mg | ORAL_TABLET | Freq: Two times a day (BID) | ORAL | Status: DC
  Administered 2022-01-20 – 2022-01-21 (×4): 1 mg via ORAL
  Filled 2022-01-20 (×4): qty 1

## 2022-01-20 MED ORDER — ACETAMINOPHEN 325 MG PO TABS
650.0000 mg | ORAL_TABLET | Freq: Four times a day (QID) | ORAL | Status: DC | PRN
  Administered 2022-01-20: 650 mg via ORAL
  Filled 2022-01-20: qty 2

## 2022-01-20 MED ORDER — ALUM & MAG HYDROXIDE-SIMETH 200-200-20 MG/5ML PO SUSP: 30.0000 mL | ORAL | Status: DC | PRN

## 2022-01-20 MED ORDER — MAGNESIUM HYDROXIDE 400 MG/5ML PO SUSP: 30.0000 mL | Freq: Every day | ORAL | Status: DC | PRN

## 2022-01-20 MED ORDER — MIRTAZAPINE 15 MG PO TABS
15.0000 mg | ORAL_TABLET | Freq: Every day | ORAL | Status: DC
  Administered 2022-01-20 – 2022-01-21 (×2): 15 mg via ORAL
  Filled 2022-01-20 (×2): qty 1

## 2022-01-20 MED ORDER — IPRATROPIUM-ALBUTEROL 0.5-2.5 (3) MG/3ML IN SOLN
3.0000 mL | Freq: Once | RESPIRATORY_TRACT | Status: AC
  Administered 2022-01-20: 3 mL via RESPIRATORY_TRACT
  Filled 2022-01-20: qty 3

## 2022-01-20 MED ORDER — POTASSIUM CHLORIDE CRYS ER 20 MEQ PO TBCR
20.0000 meq | EXTENDED_RELEASE_TABLET | Freq: Every day | ORAL | Status: DC
  Administered 2022-01-20 – 2022-01-21 (×2): 20 meq via ORAL
  Filled 2022-01-20 (×2): qty 1

## 2022-01-20 MED ORDER — FLUOXETINE HCL 20 MG PO CAPS
40.0000 mg | ORAL_CAPSULE | Freq: Every day | ORAL | Status: DC
  Administered 2022-01-20 – 2022-01-21 (×2): 40 mg via ORAL
  Filled 2022-01-20 (×2): qty 2

## 2022-01-20 MED ORDER — METHYLPREDNISOLONE SODIUM SUCC 125 MG IJ SOLR
125.0000 mg | Freq: Once | INTRAMUSCULAR | Status: AC
  Administered 2022-01-20: 125 mg via INTRAVENOUS
  Filled 2022-01-20: qty 2

## 2022-01-20 MED ORDER — LORATADINE 10 MG PO TABS
10.0000 mg | ORAL_TABLET | Freq: Every day | ORAL | Status: DC
  Administered 2022-01-20 – 2022-01-21 (×2): 10 mg via ORAL
  Filled 2022-01-20 (×2): qty 1

## 2022-01-20 MED ORDER — TRAZODONE HCL 50 MG PO TABS
50.0000 mg | ORAL_TABLET | Freq: Every evening | ORAL | Status: DC | PRN
  Administered 2022-01-20: 50 mg via ORAL
  Filled 2022-01-20: qty 1

## 2022-01-20 MED ORDER — HYDROXYZINE HCL 25 MG PO TABS
25.0000 mg | ORAL_TABLET | Freq: Three times a day (TID) | ORAL | Status: DC | PRN
  Administered 2022-01-21 (×2): 25 mg via ORAL
  Filled 2022-01-20 (×2): qty 1

## 2022-01-20 MED ORDER — DM-GUAIFENESIN ER 30-600 MG PO TB12
1.0000 | ORAL_TABLET | Freq: Two times a day (BID) | ORAL | Status: DC
Start: 2022-01-20 — End: 2022-01-21
  Administered 2022-01-20 – 2022-01-21 (×3): 1 via ORAL
  Filled 2022-01-20 (×3): qty 1

## 2022-01-20 NOTE — BH Assessment (Signed)
Clinician messaged Cameron G. Kiser, RN: "Hey. It's Trey with TTS. Is the pt able to engage in the assessment, if so the pt will need to be placed in a private room. Also is the pt under IVC?"  ° ° °Clinician awaiting response.  ° ° ° °Aymar Whitfill D Niomi Valent, MS, LCMHC, CRC °Triage Specialist °[phone] ° ° °

## 2022-01-20 NOTE — ED Notes (Signed)
Pt. Told me he was allergic to peanuts.... isn't in his chart. Told the Nurse to document it and will report it with next shift.

## 2022-01-20 NOTE — ED Provider Notes (Signed)
[facility]-EMERGENCY DEPT Provider Note   CSN: [id number] Arrival date & time: 01/20/22  0019     History  Chief Complaint  Patient presents with   Cough    Joseph Curtis is a 61 y.o. male.  The history is provided by the patient and medical records.  Cough  61 year old male with history of anxiety, polysubstance abuse, PTSD, hypertension, presenting to the ED with multiple complaints.  Patient and wife currently homeless, living in and out of their car.  He was doing some work on a trailer fixing a deck yesterday out in the cold and rain, since then worsening cough.  He states he feels a lot of congestion in his chest that seems to "rattle" but he is not really producing a lot of mucus.  He has not had any fever but is having some chills.  He is a daily smoker.  Denies chest pain.  No known sick contacts or covid/flu exposures.  Wife also with concerns that his psychiatric medications are no longer working.  States he has been rationing his medications as they are expensive and they have not had the funds to pay for them regularly.  States the other day she caught him with a rope tied in to a noose.  He does admit to plan to hang himself.  He does have a psychiatrist that he sees, Dr. Evelene Croon.  He denies any HI/AVH.  Home Medications Prior to Admission medications   Medication Sig Start Date End Date Taking? Authorizing Provider  alprazolam Prudy Feeler) 2 MG tablet Take 2 mg by mouth 3 (three) times daily. 05/31/20   [provider]  ARIPiprazole (ABILIFY) 5 MG tablet Take 5 mg by mouth at bedtime.     [provider]  Aspirin-Salicylamide-Caffeine (BC HEADACHE POWDER PO) Take 1 packet by mouth as needed (for pain).    [provider]  atorvastatin (LIPITOR) 20 MG tablet Take 20 mg by mouth at bedtime.     [provider]  benztropine (COGENTIN) 1 MG tablet Take 1 tablet (1 mg total) by mouth 2 (two) times daily. Patient not taking:  Reported on 06/30/2020 06/06/15   Oswaldo Conroy, PA-C  diphenhydrAMINE (BENADRYL) 25 MG tablet Take 25-100 mg by mouth as needed (for allergic reactions).     [provider]  EPINEPHrine 0.3 mg/0.3 mL IJ SOAJ injection Inject 0.3 mLs (0.3 mg total) into the muscle as needed for anaphylaxis. Patient taking differently: Inject 0.3-0.6 mg into the muscle as needed for anaphylaxis.  07/19/19   Lawyer, Cristal Deer, PA-C  EPINEPHrine 0.3 mg/0.3 mL IJ SOAJ injection Inject 0.3 mLs (0.3 mg total) into the muscle as needed for anaphylaxis. 06/30/20   Alvira Monday, MD  FLUoxetine (PROZAC) 40 MG capsule Take 40 mg by mouth daily.    [provider]  gabapentin (NEURONTIN) 600 MG tablet Take 600 mg by mouth 3 (three) times daily.     [provider]  hydrOXYzine (ATARAX/VISTARIL) 50 MG tablet Take 100 mg by mouth every 6 (six) hours as needed for anxiety. 05/18/20   [provider]  ibuprofen (ADVIL,MOTRIN) 200 MG tablet Take 600 mg by mouth every 6 (six) hours as needed (for pain or headaches).     [provider]  lisinopril (ZESTRIL) 20 MG tablet Take 20 mg by mouth daily.    [provider]  mirtazapine (REMERON) 15 MG tablet Take 15 mg by mouth at bedtime.    [provider]  Allergies    Bee venom, Penicillins, and Codeine    Review of Systems   Review of Systems  Respiratory:  Positive for cough.   Psychiatric/Behavioral:  Positive for suicidal ideas.   All other systems reviewed and are negative.  Physical Exam Updated Vital Signs BP (!) 141/126 (BP Location: Left Arm)    Pulse 98    Temp 98.3 F (36.8 C) (Oral)    Resp 19    Ht 5\' 7"  (1.702 m)    Wt 61.2 kg    SpO2 97%    BMI 21.14 kg/m  Physical Exam Vitals and nursing note reviewed.  Constitutional:      Appearance: He is well-developed.  HENT:     Head: Normocephalic and atraumatic.  Eyes:     Conjunctiva/sclera: Conjunctivae normal.     Pupils: Pupils are equal,  round, and reactive to light.  Cardiovascular:     Rate and Rhythm: Normal rate and regular rhythm.     Heart sounds: Normal heart sounds.  Pulmonary:     Effort: Pulmonary effort is normal.     Breath sounds: Wheezing present.     Comments: Scattered wheezes, dry cough, no acute distress, sats 95% on RA Abdominal:     General: Bowel sounds are normal.     Palpations: Abdomen is soft.  Musculoskeletal:        General: Normal range of motion.     Cervical back: Normal range of motion.  Skin:    General: Skin is warm and dry.  Neurological:     Mental Status: He is alert and oriented to person, place, and time.    ED Results / Procedures / Treatments   Labs (all labs ordered are listed, but only abnormal results are displayed) Labs Reviewed  COMPREHENSIVE METABOLIC PANEL - Abnormal; Notable for the following components:      Result Value   Potassium 3.1 (*)    BUN 23 (*)    Calcium 8.7 (*)    Total Bilirubin 0.1 (*)    All other components within normal limits  SALICYLATE LEVEL - Abnormal; Notable for the following components:   Salicylate Lvl <7.0 (*)    All other components within normal limits  ACETAMINOPHEN LEVEL - Abnormal; Notable for the following components:   Acetaminophen (Tylenol), Serum <10 (*)    All other components within normal limits  RESP PANEL BY RT-PCR (FLU A&B, COVID) ARPGX2  CBC WITH DIFFERENTIAL/PLATELET  ETHANOL  RAPID URINE DRUG SCREEN, HOSP PERFORMED    EKG EKG Interpretation  Date/Time:  Sunday January 20 2022 01:10:53 EST Ventricular Rate:  80 PR Interval:  150 QRS Duration: 87 QT Interval:  392 QTC Calculation: 453 R Axis:   66 Text Interpretation: Sinus rhythm No acute changes Confirmed by [id number] [phone]) on 01/20/2022 1:14:45 AM  Radiology DG Chest 2 View  Result Date: 01/20/2022 CLINICAL DATA:  Cough EXAM: CHEST - 2 VIEW COMPARISON:  06/15/2015 FINDINGS: Progressive elevation of the left hemidiaphragm. Lungs are clear. No  pneumothorax or pleural effusion. Cardiac size within normal limits. Pulmonary vascularity is normal. No acute bone abnormality. IMPRESSION: Progressive elevation of the left hemidiaphragm. No superimposed focal pulmonary infiltrate. Electronically Signed   By: 06/17/2015 M.D.   On: 01/20/2022 01:10    Procedures Procedures    Medications Ordered in ED Medications  methylPREDNISolone sodium succinate (SOLU-MEDROL) 125 mg/2 mL injection 125 mg (125 mg Intravenous Given 01/20/22 0114)  ipratropium-albuterol (DUONEB) 0.5-2.5 (3) MG/3ML nebulizer solution 3  mL (3 mLs Nebulization Given 01/20/22 0114)    ED Course/ Medical Decision Making/ A&P                           Medical Decision Making Amount and/or Complexity of Data Reviewed External Data Reviewed: labs and radiology. Labs: ordered. Radiology: ordered and independent interpretation performed. ECG/medicine tests: ordered and independent interpretation performed.  Risk Prescription drug management.   61 year old male presenting to the ED with multiple complaints.  Has had a cough for the past week after working in the cold and rain few days ago.  Denies any chest pain or shortness of breath.  Also has been experiencing some suicidal ideation, wife found him with a rope tied in a noose a few days ago.  Does admit to planning to hang himself.  Denies any HI/AVH.  He is afebrile and nontoxic in appearance here.  Does have some scattered wheezes on exam with dry cough.  His vitals are stable on room air, no signs of respiratory distress.  We will plan for labs, chest x-ray, COVID and flu screen.  Given dose of Solu-Medrol and DuoNeb.  2:49 AM Lung sounds have cleared after neb treatment Solu-Medrol.  He is resting comfortably, vitals remained stable on room air.  Labs with normal WBC count, no significant electrolyte derangement.  His potassium is mildly low, will give oral replacement.  EKG is nonischemic.  Chest x-ray without findings  of pneumonia.  Covid/flu screen negative.  Results discussed with patient and wife at bedside, they acknowledge understanding.  Feel he is medically cleared at this time.  Will get TTS evaluation.  TTS has evaluated, recommends transfer to [facility] for ongoing monitoring and stabilization.  EMTALA completed.    Final Clinical Impression(s) / ED Diagnoses Final diagnoses:  Acute cough  Suicidal ideation    Rx / DC Orders ED Discharge Orders     None         Garlon Hatchet, PA-C 01/20/22 0454    Nira Conn, MD 01/20/22 [phone]

## 2022-01-20 NOTE — ED Provider Notes (Signed)
Behavioral Health Admission [facility])  Date: 01/20/22 Patient Name: Joseph Curtis MRN: [id number] Chief Complaint: No chief complaint on file.     Diagnoses:  Final diagnoses:  Severe episode of recurrent major depressive disorder, with psychotic features (HCC)    HPI:   Joseph Curtis is a 61 year old male with a past psychiatric history significant for PTSD, major depressive disorder, and anxiety who presents to [facility], after being assessed at [facility] ED, school or contemplating suicide.  Patient states that he feels worthless and useless.  He reports that he and his wife are currently homeless but living in her car.  Patient states that they have been living in their car for roughly a year but on occasion, patient states that he would stay with friends if they allowed it.  Patient states that if he was not around, his wife would not be living in a car and would instead be with her family.  Patient endorses having a psychiatrist by the name of Dr. Evelene Croon patient denies having a therapist saying that he is not good at talking and signed it difficult to open up.  Patient endorses depression mostly attributed to not knowing what is going to happen to his wife while he is receiving treatment.  He reports that his wife cannot make it on her own on the streets.  Patient endorses the following depressive symptoms: decreased energy, lack of motivation, irritability, and feelings of worthlessness.  Patient also endorses anxiety and rates his anxiety a 7 or 8 out of 10.  Patient's biggest concern today is the wellbeing of his wife.  Patient states that he is currently taking the following medications: Remeron, Prozac, and Abilify.  He states that he is also taking other medications but does not remember the names at this time.  Patient states that he is not taking his medications as often due to them being less effective.  Patient states that he has been  hospitalized several times due to mental health.  Patient reports that his wife stopped him today from attempting suicide via hanging.  Patient is alert and oriented x4, calm, cooperative, and fully engaged in conversation during the encounter.  Patient denies active suicidal ideations but states that he did attempt today.  Patient denies homicidal ideations.  He further denies visual hallucinations but does endorse auditory hallucinations characterized by voices telling him how to hurt himself.  Patient does not appear to be responding to internal/external stimuli.  Patient endorses poor sleep and receives on average 3 to 4 hours of sleep each night.  Patient endorses decreased appetite.  Patient denies alcohol consumption.  Patient endorses tobacco use stating that he smokes what he can.  Patient denies active illicit drug use but states that earlier this week he used marijuana that may have been laced with crack cocaine.  Patient states that he has a past history of shooting up drugs.  PHQ 2-9:   Flowsheet Row ED from 01/20/2022 in [facility]  C-SSRS RISK CATEGORY No Risk Low Risk        Total Time spent with patient: 15 minutes  Musculoskeletal  Strength & Muscle Tone: within normal limits Gait & Station: normal Patient leans: N/A  Psychiatric Specialty Exam  Presentation General Appearance: Appropriate for Environment; Casual  Eye Contact:Fleeting  Speech:Clear and Coherent; Normal Rate  Speech Volume:Decreased  Handedness:Right   Mood and  Affect  Mood:Anxious; Depressed; Irritable; Worthless; Dysphoric  Affect:Congruent; Depressed; Tearful   Thought Process  Thought Processes:Coherent; Goal Directed  Descriptions of Associations:Intact  Orientation:Full (Time, Place and Person)  Thought Content:WDL    Hallucinations:Hallucinations: Auditory Description of Auditory  Hallucinations: Patient's auditory hallucinations are characterized by voices telling him how to hurt himself  Ideas of Reference:None  Suicidal Thoughts:Suicidal Thoughts: Yes, Passive SI Passive Intent and/or Plan: Without Intent; Without Plan  Homicidal Thoughts:Homicidal Thoughts: No   Sensorium  Memory:Immediate Fair; Recent Fair; Remote Fair  Judgment:Fair  Insight:Fair   Executive Functions  Concentration:Good  Attention Span:Good  Recall:Fair  Fund of Knowledge:Fair  Language:Good   Psychomotor Activity  Psychomotor Activity:Psychomotor Activity: Normal   Assets  Assets:Communication Skills; Desire for Improvement; Social Support   Sleep  Sleep:Sleep: Poor   Nutritional Assessment (For OBS and FBC admissions only) Has the patient had a weight loss or gain of 10 pounds or more in the last 3 months?: No Has the patient had a decrease in food intake/or appetite?: No Does the patient have dental problems?: No Does the patient have eating habits or behaviors that may be indicators of an eating disorder including binging or inducing vomiting?: No Has the patient recently lost weight without trying?: 0 Has the patient been eating poorly because of a decreased appetite?: 0 Malnutrition Screening Tool Score: 0    Physical Exam ROS  Blood pressure (!) 141/82, pulse 77, temperature 98 F (36.7 C), temperature source Oral, resp. rate 18, SpO2 96 %. There is no height or weight on file to calculate BMI.  Past Psychiatric History:  Major depressive disorder PTSD Anxiety  Is the patient at risk to self? Yes  Has the patient been a risk to self in the past 6 months? Yes .    Has the patient been a risk to self within the distant past?  Unsure   Is the patient a risk to others? No   Has the patient been a risk to others in the past 6 months? No   Has the patient been a risk to others within the distant past?  Unsure  Past Medical History:  Past Medical  History:  Diagnosis Date   Anxiety    Arthritis    Back pain    chronic   Depression    Fall    from construction site   Headache    Hypertension    Legg-Calve-Perthes disease, bilateral    Neck pain    chronic   Panic attacks    Pneumonia     Past Surgical History:  Procedure Laterality Date   HERNIA REPAIR     JOINT REPLACEMENT     right knee, left shoulder   ORCHIECTOMY Left     Family History: No family history on file.  Social History:  Social History   Socioeconomic History   Marital status: Married    Spouse name: Not on file   Number of children: Not on file   Years of education: Not on file   Highest education level: Not on file  Occupational History   Not on file  Tobacco Use   Smoking status: Every Day    Packs/day: 0.50    Years: 30.00    Pack years: 15.00    Types: Cigarettes   Smokeless tobacco: Never  Vaping Use   Vaping Use: Never used  Substance and Sexual Activity   Alcohol use: Not Currently   Drug use: Not Currently    Types:  Cocaine, Heroin, Marijuana    Comment: states stopped using drugs few months back.   Sexual activity: Not on file  Other Topics Concern   Not on file  Social History Narrative   Not on file   Social Determinants of Health   Financial Resource Strain: Not on file  Food Insecurity: Not on file  Transportation Needs: Not on file  Physical Activity: Not on file  Stress: Not on file  Social Connections: Not on file  Intimate Partner Violence: Not on file    SDOH:  SDOH Screenings   Alcohol Screen: Not on file  Depression (PHQ2-9): Not on file  Financial Resource Strain: Not on file  Food Insecurity: Not on file  Housing: Not on file  Physical Activity: Not on file  Social Connections: Not on file  Stress: Not on file  Tobacco Use: High Risk   Smoking Tobacco Use: Every Day   Smokeless Tobacco Use: Never   Passive Exposure: Not on file  Transportation Needs: Not on file    Last Labs:  Admission  on 01/20/2022, Discharged on 01/20/2022  Component Date Value Ref Range Status   WBC 01/20/2022 9.5  4.0 - 10.5 K/uL Final   RBC 01/20/2022 4.32  4.22 - 5.81 MIL/uL Final   Hemoglobin 01/20/2022 13.5  13.0 - 17.0 g/dL Final   HCT 38/18/2993 39.8  39.0 - 52.0 % Final   MCV 01/20/2022 92.1  80.0 - 100.0 fL Final   MCH 01/20/2022 31.3  26.0 - 34.0 pg Final   MCHC 01/20/2022 33.9  30.0 - 36.0 g/dL Final   RDW 71/69/6789 12.8  11.5 - 15.5 % Final   Platelets 01/20/2022 398  150 - 400 K/uL Final   nRBC 01/20/2022 0.0  0.0 - 0.2 % Final   Neutrophils Relative % 01/20/2022 51  % Final   Neutro Abs 01/20/2022 4.9  1.7 - 7.7 K/uL Final   Lymphocytes Relative 01/20/2022 36  % Final   Lymphs Abs 01/20/2022 3.4  0.7 - 4.0 K/uL Final   Monocytes Relative 01/20/2022 11  % Final   Monocytes Absolute 01/20/2022 1.0  0.1 - 1.0 K/uL Final   Eosinophils Relative 01/20/2022 2  % Final   Eosinophils Absolute 01/20/2022 0.2  0.0 - 0.5 K/uL Final   Basophils Relative 01/20/2022 0  % Final   Basophils Absolute 01/20/2022 0.0  0.0 - 0.1 K/uL Final   Immature Granulocytes 01/20/2022 0  % Final   Abs Immature Granulocytes 01/20/2022 0.02  0.00 - 0.07 K/uL Final   Performed at [facility], [address] [place], [place] [address]   Sodium 01/20/2022 135  135 - 145 mmol/L Final   Potassium 01/20/2022 3.1 (L)  3.5 - 5.1 mmol/L Final   Chloride 01/20/2022 103  98 - 111 mmol/L Final   CO2 01/20/2022 25  22 - 32 mmol/L Final   Glucose, Bld 01/20/2022 94  70 - 99 mg/dL Final   Glucose reference range applies only to samples taken after fasting for at least 8 hours.   BUN 01/20/2022 23 (H)  6 - 20 mg/dL Final   Creatinine, Ser 01/20/2022 1.02  0.61 - 1.24 mg/dL Final   Calcium 75/09/2584 8.7 (L)  8.9 - 10.3 mg/dL Final   Total Protein 27/78/2423 7.1  6.5 - 8.1 g/dL Final   Albumin 53/61/4431 3.6  3.5 - 5.0 g/dL Final   AST 54/00/8676 19  15 - 41 U/L Final   ALT 01/20/2022 15  0 -  44 U/L Final    Alkaline Phosphatase 01/20/2022 72  38 - 126 U/L Final   Total Bilirubin 01/20/2022 0.1 (L)  0.3 - 1.2 mg/dL Final   GFR, Estimated 01/20/2022 >60  >60 mL/min Final   Comment: (NOTE) Calculated using the CKD-EPI Creatinine Equation (2021)    Anion gap 01/20/2022 7  5 - 15 Final   Performed at [facility], [address], [place], [place] [address]   Alcohol, Ethyl (B) 01/20/2022 <10  <10 mg/dL Final   Comment: (NOTE) Lowest detectable limit for serum alcohol is 10 mg/dL.  For medical purposes only. Performed at [facility], [address], [place], [place] [address]    Salicylate Lvl 01/20/2022 <7.0 (L)  7.0 - 30.0 mg/dL Final   Performed at [facility], [address] [place], [place] [address]   Acetaminophen (Tylenol), Serum 01/20/2022 <10 (L)  10 - 30 ug/mL Final   Comment: (NOTE) Therapeutic concentrations vary significantly. A range of 10-30 ug/mL  may be an effective concentration for many patients. However, some  are best treated at concentrations outside of this range. Acetaminophen concentrations >150 ug/mL at 4 hours after ingestion  and >50 ug/mL at 12 hours after ingestion are often associated with  toxic reactions.  Performed at [facility], [address], [place], [place] [address]    SARS Coronavirus 2 by RT PCR 01/20/2022 NEGATIVE  NEGATIVE Final   Comment: (NOTE) SARS-CoV-2 target nucleic acids are NOT DETECTED.  The SARS-CoV-2 RNA is generally detectable in upper respiratory specimens during the acute phase of infection. The lowest concentration of SARS-CoV-2 viral copies this assay can detect is 138 copies/mL. A negative result does not preclude SARS-Cov-2 infection and should not be used as the sole basis for treatment or other patient management decisions. A negative result may occur with  improper specimen collection/handling, submission of specimen other than  nasopharyngeal swab, presence of viral mutation(s) within the areas targeted by this assay, and inadequate number of viral copies(<138 copies/mL). A negative result must be combined with clinical observations, patient history, and epidemiological information. The expected result is Negative.  Fact Sheet for Patients:  BloggerCourse.com  Fact Sheet for Healthcare Providers:  SeriousBroker.it  This test is no                          t yet approved or cleared by the [place] FDA and  has been authorized for detection and/or diagnosis of SARS-CoV-2 by FDA under an Emergency Use Authorization (EUA). This EUA will remain  in effect (meaning this test can be used) for the duration of the COVID-19 declaration under Section 564(b)(1) of the Act, 21 U.S.C.section 360bbb-3(b)(1), unless the authorization is terminated  or revoked sooner.       Influenza A by PCR 01/20/2022 NEGATIVE  NEGATIVE Final   Influenza B by PCR 01/20/2022 NEGATIVE  NEGATIVE Final   Comment: (NOTE) The Xpert Xpress SARS-CoV-2/FLU/RSV plus assay is intended as an aid in the diagnosis of influenza from Nasopharyngeal swab specimens and should not be used as a sole basis for treatment. Nasal washings and aspirates are unacceptable for Xpert Xpress SARS-CoV-2/FLU/RSV testing.  Fact Sheet for Patients: BloggerCourse.com  Fact Sheet for Healthcare Providers: SeriousBroker.it  This test is not yet approved or cleared by the [place] FDA and has been authorized for detection and/or diagnosis of SARS-CoV-2 by FDA under an Emergency Use Authorization (EUA). This  EUA will remain in effect (meaning this test can be used) for the duration of the COVID-19 declaration under Section 564(b)(1) of the Act, 21 U.S.C. section 360bbb-3(b)(1), unless the authorization is terminated or revoked.  Performed at [facility], [address] [place], [place] [id number]     Allergies: Bee venom, Penicillins, and Codeine  PTA Medications: (Not in a hospital admission)   Medical Decision Making  Patient to be admitted to the [facility] due to passive suicidal ideations. Admission labs to be ordered and initiated prior to patient being admitted onto the floor. Patient's psychiatric home medications have also been reviewed and ordered as well.  Recommendations  Based on my evaluation the patient does not appear to have an emergency medical condition.  Meta HatchetUchenna E Zanyah Lentsch, PA 01/20/22  6:03 AM

## 2022-01-20 NOTE — ED Notes (Signed)
Received patient this PM. Patient in his bed sleeping. Patient respirations are even and unlabored. Will continue to monitor for safety.  °

## 2022-01-20 NOTE — ED Notes (Signed)
Patient is calm and was cooperative with admission process.  He is quiet and soft spoken with dysphoric mood and constricted affect.  He expressed anxiety about the welfare of his wife whom he lives in his car with as they are homeless.  Support given and accepted.  Labs drawn and pending.  UDS complete and he was positive for bzd, cocaine and THC.  Patient was given food and shown around the unit.  Encouraged him to seek out staff if overwhelmed by thoughts or feelings.  Will monitor and provide safe supportive environment.

## 2022-01-20 NOTE — ED Triage Notes (Addendum)
Patient was working in cold outside, rain, and developed a bad cough. Wife tried to get patient to go to doctor but he would not go. He has been having panic attacks. He said his psychiatric medication is not working either.

## 2022-01-20 NOTE — ED Provider Notes (Addendum)
Plan of Care: Patient transferred to continuous assessment for overnight observation. Patient does not meet FBC criteria due to having medicare. This provider explained the FBC criteria to the patient. Patient denies questions at this time. Patient agreeable to transferring at this time. Nursing staff informed.   Patient's K+ is 3.1. Potassium chloride 20 mEq x 3 doses ordered.

## 2022-01-20 NOTE — ED Notes (Signed)
Pt admitted to [facility] endorsing SI, AH, and depression. Pt denies HI,VH. Pt denies SI,HI,AVH at present.  Patient was cooperative during the admission assessment. Skin assessment complete. Patient oriented to unit and unit rules. Meal and drinks offered to patient.  Patient verbalized agreement to treatment plans. Patient verbally contracts for safety while hospitalized. Will monitor for safety.

## 2022-01-20 NOTE — ED Notes (Signed)
Pt has been sleeping since he came back over to this side, pt had lunch and a snack.

## 2022-01-20 NOTE — ED Notes (Signed)
Patient states he has not eaten in 2 days and would like to eat something before remaining labs are drawn

## 2022-01-20 NOTE — ED Notes (Signed)
Due to patient having medicare he will be transferred to [facility] observation.  He is aware and agreeable to this plan.

## 2022-01-20 NOTE — Progress Notes (Addendum)
Pt is transferred from [facility]. Pt is admitted Continuous assessment due to passive SI, AH and depression. Pt verbally contracts for safety on the unit. Pt is alert and oriented with flat affect. Pt appears to be sleepy but is able to rouse to name. Pt is ambulatory and is oriented to staff/unit. Pt complained for generalized pain. PRN Tylenol administered with no incident. Pt denies current HI/VH. Staff will monitor for pt's safety.
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
## 2022-01-20 NOTE — Progress Notes (Signed)
Pt is asleep. Respirations are even and unlabored. No signs of acute distress noted.staff will monitor for pt's safety.

## 2022-01-21 ENCOUNTER — Inpatient Hospital Stay (HOSPITAL_COMMUNITY)
Admission: AD | Admit: 2022-01-21 | Discharge: 2022-02-01 | DRG: 885 | Disposition: A | Discharge status: Home / Self Care | Payer: Medicare Other | Source: Intra-hospital | Attending: Psychiatry | Admitting: Psychiatry

## 2022-01-21 DIAGNOSIS — Z885 Allergy status to narcotic agent status: Secondary | ICD-10-CM | POA: Diagnosis not present

## 2022-01-21 DIAGNOSIS — F13230 Sedative, hypnotic or anxiolytic dependence with withdrawal, uncomplicated: Secondary | ICD-10-CM | POA: Diagnosis not present

## 2022-01-21 DIAGNOSIS — Z20822 Contact with and (suspected) exposure to covid-19: Secondary | ICD-10-CM | POA: Diagnosis not present

## 2022-01-21 DIAGNOSIS — M9111 Juvenile osteochondrosis of head of femur [Legg-Calve-Perthes], right leg: Secondary | ICD-10-CM | POA: Diagnosis present

## 2022-01-21 DIAGNOSIS — R059 Cough, unspecified: Secondary | ICD-10-CM | POA: Diagnosis present

## 2022-01-21 DIAGNOSIS — K5900 Constipation, unspecified: Secondary | ICD-10-CM | POA: Diagnosis not present

## 2022-01-21 DIAGNOSIS — R45851 Suicidal ideations: Secondary | ICD-10-CM | POA: Diagnosis present

## 2022-01-21 DIAGNOSIS — F1910 Other psychoactive substance abuse, uncomplicated: Secondary | ICD-10-CM | POA: Diagnosis present

## 2022-01-21 DIAGNOSIS — F17210 Nicotine dependence, cigarettes, uncomplicated: Secondary | ICD-10-CM | POA: Diagnosis present

## 2022-01-21 DIAGNOSIS — Z96651 Presence of right artificial knee joint: Secondary | ICD-10-CM | POA: Diagnosis present

## 2022-01-21 DIAGNOSIS — G4700 Insomnia, unspecified: Secondary | ICD-10-CM | POA: Diagnosis present

## 2022-01-21 DIAGNOSIS — F410 Panic disorder [episodic paroxysmal anxiety] without agoraphobia: Secondary | ICD-10-CM | POA: Diagnosis present

## 2022-01-21 DIAGNOSIS — M1990 Unspecified osteoarthritis, unspecified site: Secondary | ICD-10-CM | POA: Diagnosis present

## 2022-01-21 DIAGNOSIS — Z79899 Other long term (current) drug therapy: Secondary | ICD-10-CM | POA: Diagnosis not present

## 2022-01-21 DIAGNOSIS — Z91030 Bee allergy status: Secondary | ICD-10-CM | POA: Diagnosis not present

## 2022-01-21 DIAGNOSIS — Z5902 Unsheltered homelessness: Secondary | ICD-10-CM

## 2022-01-21 DIAGNOSIS — Z9151 Personal history of suicidal behavior: Secondary | ICD-10-CM | POA: Diagnosis not present

## 2022-01-21 DIAGNOSIS — F333 Major depressive disorder, recurrent, severe with psychotic symptoms: Secondary | ICD-10-CM | POA: Diagnosis present

## 2022-01-21 DIAGNOSIS — M9112 Juvenile osteochondrosis of head of femur [Legg-Calve-Perthes], left leg: Secondary | ICD-10-CM | POA: Diagnosis present

## 2022-01-21 DIAGNOSIS — F322 Major depressive disorder, single episode, severe without psychotic features: Secondary | ICD-10-CM

## 2022-01-21 DIAGNOSIS — Z23 Encounter for immunization: Secondary | ICD-10-CM | POA: Diagnosis not present

## 2022-01-21 DIAGNOSIS — F4310 Post-traumatic stress disorder, unspecified: Secondary | ICD-10-CM | POA: Diagnosis present

## 2022-01-21 DIAGNOSIS — E871 Hypo-osmolality and hyponatremia: Secondary | ICD-10-CM | POA: Diagnosis not present

## 2022-01-21 DIAGNOSIS — Z96612 Presence of left artificial shoulder joint: Secondary | ICD-10-CM | POA: Diagnosis present

## 2022-01-21 DIAGNOSIS — Z880 Allergy status to penicillin: Secondary | ICD-10-CM

## 2022-01-21 DIAGNOSIS — I10 Essential (primary) hypertension: Secondary | ICD-10-CM | POA: Diagnosis not present

## 2022-01-21 DIAGNOSIS — F419 Anxiety disorder, unspecified: Secondary | ICD-10-CM | POA: Diagnosis present

## 2022-01-21 DIAGNOSIS — M542 Cervicalgia: Secondary | ICD-10-CM | POA: Diagnosis present

## 2022-01-21 DIAGNOSIS — F332 Major depressive disorder, recurrent severe without psychotic features: Secondary | ICD-10-CM | POA: Diagnosis present

## 2022-01-21 NOTE — ED Notes (Addendum)
Pt resting in bed. A&O x4, calm and cooperative. Pt endorses passive SI, denies current plan and states, "it's just on my mind." Pt is able to verbally contract for safety. Pt denies current HI/AVH. Pt requesting medication for anxiety- see MAR. Pt denies any further needs at this time. No signs of acute distress noted. Will continue to monitor for safety.

## 2022-01-21 NOTE — ED Notes (Signed)
[facility] called 

## 2022-01-21 NOTE — ED Notes (Signed)
Patient asked if he still felt suicidal he stated "Im not thinking about it now .Marland KitchenMarland KitchenLike I was before. Patient requested and received medication for cough. Patient cooperative with evening medication administration. Patient is sleeping comfortably in his bed. Patient respirations are even and unlabored. No signs of acute distress noted. Will continue to monitor for safety.

## 2022-01-21 NOTE — Progress Notes (Signed)
Patient resting in bed, no objective signs of distress. Staff will continue to monitor.

## 2022-01-21 NOTE — ED Notes (Signed)
Report given to Michael, RN at [facility] 

## 2022-01-21 NOTE — Discharge Instructions (Addendum)
Transfer to [facility] for inpatient pscyhiatric admission.

## 2022-01-21 NOTE — Progress Notes (Signed)
Nursing report received, patient currently resting with eyes closed and respirations even and unlabored at this time. No acute distress observed. Staff will continue to monitor.

## 2022-01-21 NOTE — ED Provider Notes (Signed)
FBC/OBS ASAP Discharge Summary  Date and Time: 01/21/2022 1:42 PM  Name: Joseph Curtis  MRN:  [id number]   Discharge Diagnoses:  Final diagnoses:  Severe episode of recurrent major depressive disorder, with psychotic features [facility])    Subjective:  Joseph Curtis, 61 y.o., male patient who initially presented to to the [facility] ED for worsening depression and SI and was transferred to the [facility]. He was admitted to the continuous assessemnt unit for overnight observation. Upon reassessment he meets critieria for inpatient psychiatric admission. [facility] notifed.  Patient seen face to face by this provider, Dr. Bronwen Betters; and  chart reviewed on 01/21/22.    On evaluation Joseph Curtis reports he is currently living in his car with his spouse.  He recently lost a close friend.  Yesterday he began having suicidal thoughts of hanging himself from a tree.  Reports he had the rope and hand and walking to the tree but his wife stopped him and encouraged him to come to the emergency room for help.  Reports recent use of marijuana but denies having a substance problem.  Reports his marijuana must have been laced with cocaine.  His UDS on admission was positive for benzodiazepines, cocaine, and marijuana.  Patient reports a history of suicide attempts and inpatient psychiatric admissions.  Reports he has previously been admitted to [facility] in [place] and [facility] in [place].  During evaluation Joseph Curtis is in sitting position.  He is alert/oriented x4 and cooperative.  His speech is at a clear tone, moderate volume, and normal pace.  He makes good eye contact.  He endorses depression and anxiety.  He endorses feelings of hopelessness, helplessness, fatigue, decreased appetite and sleep.  He has a depressed affect.  Objectively he does not appear to be responding to internal/external stimuli.  He denies AVH at this time, patient previously endorsed AVH.  He endorses suicidal ideations.  States he  cannot trust himself to stay safe.  He cannot contract for safety.  Reports his suicidal thoughts are intense and states, "I know I need help".  He has access to firearms/weapons.  Patient is logical and has answered questions appropriately.  His judgment is poor and insight is fair.  Discussed inpatient psychiatric treatment with patient.  He is in agreement.    Stay Summary:   Patient has remained calm and cooperative while on the unit.  He has interacted with staff and other patients appropriately.  He has endorsed suicidal ideations with nursing staff while on the unit.  Patient meets inpatient psychiatric admission criteria.  [facility] notified and patient has been accepted.  Total Time spent with patient: 20 minutes  Past Psychiatric History: MDD, PTSD, anxiety Past Medical History:  Past Medical History:  Diagnosis Date   Anxiety    Arthritis    Back pain    chronic   Depression    Fall    from construction site   Headache    Hypertension    Legg-Calve-Perthes disease, bilateral    Neck pain    chronic   Panic attacks    Pneumonia     Past Surgical History:  Procedure Laterality Date   HERNIA REPAIR     JOINT REPLACEMENT     right knee, left shoulder   ORCHIECTOMY Left    Family History: History reviewed. No pertinent family history. Family Psychiatric History: Unknown Social History:  Social History   Substance and Sexual Activity  Alcohol Use Not  Currently     Social History   Substance and Sexual Activity  Drug Use Not Currently   Types: Cocaine, Heroin, Marijuana   Comment: states stopped using drugs few months back.    Social History   Socioeconomic History   Marital status: Married    Spouse name: Not on file   Number of children: Not on file   Years of education: Not on file   Highest education level: Not on file  Occupational History   Not on file  Tobacco Use   Smoking status: Every Day    Packs/day: 0.50    Years: 30.00    Pack years:  15.00    Types: Cigarettes   Smokeless tobacco: Never  Vaping Use   Vaping Use: Never used  Substance and Sexual Activity   Alcohol use: Not Currently   Drug use: Not Currently    Types: Cocaine, Heroin, Marijuana    Comment: states stopped using drugs few months back.   Sexual activity: Not on file  Other Topics Concern   Not on file  Social History Narrative   Not on file   Social Determinants of Health   Financial Resource Strain: Not on file  Food Insecurity: Not on file  Transportation Needs: Not on file  Physical Activity: Not on file  Stress: Not on file  Social Connections: Not on file   SDOH:  SDOH Screenings   Alcohol Screen: Not on file  Depression (PHQ2-9): Not on file  Financial Resource Strain: Not on file  Food Insecurity: Not on file  Housing: Not on file  Physical Activity: Not on file  Social Connections: Not on file  Stress: Not on file  Tobacco Use: High Risk   Smoking Tobacco Use: Every Day   Smokeless Tobacco Use: Never   Passive Exposure: Not on file  Transportation Needs: Not on file    Tobacco Cessation:  Prescription not provided because: Patient has been accepted to [facility] for inpatient psychiatric treatment  Current Medications:  Current Facility-Administered Medications  Medication Dose Route Frequency Provider Last Rate Last Admin   acetaminophen (TYLENOL) tablet 650 mg  650 mg Oral Q6H PRN Nwoko, Uchenna E, PA   650 mg at 01/20/22 1056   alum & mag hydroxide-simeth (MAALOX/MYLANTA) 200-200-20 MG/5ML suspension 30 mL  30 mL Oral Q4H PRN Nwoko, Uchenna E, PA       benztropine (COGENTIN) tablet 1 mg  1 mg Oral BID Nwoko, Uchenna E, PA   1 mg at 01/21/22 0904   dextromethorphan-guaiFENesin (MUCINEX DM) 30-600 MG per 12 hr tablet 1 tablet  1 tablet Oral BID Nira Conn A, NP   1 tablet at 01/21/22 0904   FLUoxetine (PROZAC) capsule 40 mg  40 mg Oral Daily Nwoko, Uchenna E, PA   40 mg at 01/21/22 0904   hydrOXYzine (ATARAX) tablet 25  mg  25 mg Oral TID PRN Nwoko, Uchenna E, PA   25 mg at 01/21/22 0300   loratadine (CLARITIN) tablet 10 mg  10 mg Oral Daily Nira Conn A, NP   10 mg at 01/21/22 0904   magnesium hydroxide (MILK OF MAGNESIA) suspension 30 mL  30 mL Oral Daily PRN Nwoko, Uchenna E, PA       mirtazapine (REMERON) tablet 15 mg  15 mg Oral QHS Nwoko, Uchenna E, PA   15 mg at 01/20/22 2138   potassium chloride SA (KLOR-CON M) CR tablet 20 mEq  20 mEq Oral Daily White, Chrystine Oiler, NP  20 mEq at 01/21/22 0904   traZODone (DESYREL) tablet 50 mg  50 mg Oral QHS PRN Nwoko, Uchenna E, PA   50 mg at 01/20/22 2140   Current Outpatient Medications  Medication Sig Dispense Refill   alprazolam (XANAX) 2 MG tablet Take 1-2 mg by mouth 3 (three) times daily.     ARIPiprazole (ABILIFY) 5 MG tablet Take 5 mg by mouth at bedtime.      Aspirin-Acetaminophen-Caffeine (GOODY HEADACHE PO) Take 1-2 packets by mouth daily as needed (for pain).     benztropine (COGENTIN) 1 MG tablet Take 1 tablet (1 mg total) by mouth 2 (two) times daily. (Patient not taking: Reported on 01/20/2022) 30 tablet 0   diphenhydrAMINE (BENADRYL) 12.5 MG chewable tablet Chew 12.5-50 mg by mouth as needed (for allergic reactions).     EPINEPHrine 0.3 mg/0.3 mL IJ SOAJ injection Inject 0.3 mLs (0.3 mg total) into the muscle as needed for anaphylaxis. (Patient not taking: Reported on 01/20/2022) 1 each 1   EPINEPHrine 0.3 mg/0.3 mL IJ SOAJ injection Inject 0.3 mLs (0.3 mg total) into the muscle as needed for anaphylaxis. 2 each 2   FLUoxetine (PROZAC) 40 MG capsule Take 40 mg by mouth daily.     gabapentin (NEURONTIN) 600 MG tablet Take 300 mg by mouth 3 (three) times daily as needed (for pain).     guaiFENesin (MUCINEX) 600 MG 12 hr tablet Take 600 mg by mouth every 12 (twelve) hours.     ibuprofen (ADVIL,MOTRIN) 200 MG tablet Take 800 mg by mouth every 6 (six) hours as needed (for pain or headaches).     lisinopril (ZESTRIL) 20 MG tablet Take 20 mg by mouth daily.      mirtazapine (REMERON) 15 MG tablet Take 15 mg by mouth at bedtime.      PTA Medications: (Not in a hospital admission)   Musculoskeletal  Strength & Muscle Tone: within normal limits Gait & Station: normal Patient leans: N/A  Psychiatric Specialty Exam  Presentation  General Appearance: Appropriate for Environment; Casual  Eye Contact:Good  Speech:Clear and Coherent; Normal Rate  Speech Volume:Normal  Handedness:Right   Mood and Affect  Mood:Depressed  Affect:Congruent   Thought Process  Thought Processes:Coherent  Descriptions of Associations:Intact  Orientation:Full (Time, Place and Person)  Thought Content:Logical     Hallucinations:Hallucinations: None Description of Auditory Hallucinations: Patient's auditory hallucinations are characterized by voices telling him how to hurt himself  Ideas of Reference:None  Suicidal Thoughts:Suicidal Thoughts: Yes, Passive SI Passive Intent and/or Plan: Without Intent; With Plan; With Means to Carry Out  Homicidal Thoughts:Homicidal Thoughts: No   Sensorium  Memory:Immediate Good; Recent Good; Remote Good  Judgment:Fair  Insight:Fair   Executive Functions  Concentration:Good  Attention Span:Good  Recall:Good  Fund of Knowledge:Good  Language:Good   Psychomotor Activity  Psychomotor Activity:Psychomotor Activity: Normal   Assets  Assets:Communication Skills; Desire for Improvement; Financial Resources/Insurance; Physical Health; Resilience; Leisure Time   Sleep  Sleep:Sleep: Poor   Nutritional Assessment (For OBS and FBC admissions only) Has the patient had a weight loss or gain of 10 pounds or more in the last 3 months?: No Has the patient had a decrease in food intake/or appetite?: No Does the patient have dental problems?: No Does the patient have eating habits or behaviors that may be indicators of an eating disorder including binging or inducing vomiting?: No Has the patient recently  lost weight without trying?: 0 Has the patient been eating poorly because of a decreased appetite?: 0 Malnutrition  Screening Tool Score: 0    Physical Exam  Physical Exam Vitals and nursing note reviewed.  Constitutional:      General: He is not in acute distress.    Appearance: Normal appearance. He is well-developed.  HENT:     Head: Normocephalic.  Eyes:     General:        Right eye: No discharge.        Left eye: No discharge.     Conjunctiva/sclera: Conjunctivae normal.  Cardiovascular:     Rate and Rhythm: Normal rate.  Pulmonary:     Effort: Pulmonary effort is normal. No respiratory distress.  Musculoskeletal:        General: Normal range of motion.     Cervical back: Normal range of motion.  Skin:    Coloration: Skin is not jaundiced or pale.  Neurological:     Mental Status: He is alert and oriented to person, place, and time.  Psychiatric:        Attention and Perception: Attention and perception normal.        Mood and Affect: Mood is anxious and depressed.        Speech: Speech normal.        Behavior: Behavior is cooperative.        Thought Content: Thought content includes suicidal ideation. Thought content includes suicidal plan.        Cognition and Memory: Cognition normal.        Judgment: Judgment is impulsive.   Review of Systems  Constitutional: Negative.   HENT: Negative.    Eyes: Negative.   Respiratory: Negative.    Cardiovascular: Negative.   Musculoskeletal: Negative.   Skin: Negative.   Neurological: Negative.   Psychiatric/Behavioral:  Positive for depression, substance abuse and suicidal ideas. The patient is nervous/anxious.   Blood pressure (!) 146/93, pulse 75, temperature 97.8 F (36.6 C), temperature source Oral, resp. rate 18, SpO2 97 %. There is no height or weight on file to calculate BMI.  Demographic Factors:  Male, Caucasian, Low socioeconomic status, and Unemployed  Loss Factors: Financial problems/change in  socioeconomic status  Historical Factors: Impulsivity  Risk Reduction Factors:   Sense of responsibility to family, Living with another person, especially a relative, Positive social support, Positive therapeutic relationship, and Positive coping skills or problem solving skills  Continued Clinical Symptoms:  Severe Anxiety and/or Agitation Depression:   Comorbid alcohol abuse/dependence Hopelessness Impulsivity Alcohol/Substance Abuse/Dependencies  Cognitive Features That Contribute To Risk:  None    Suicide Risk:  Severe:  Frequent, intense, and enduring suicidal ideation, specific plan, no subjective intent, but some objective markers of intent (i.e., choice of lethal method), the method is accessible, some limited preparatory behavior, evidence of impaired self-control, severe dysphoria/symptomatology, multiple risk factors present, and few if any protective factors, particularly a lack of social support.  Plan Of Care/Follow-up recommendations:  Activity:  as tolerated  Diet:  regular   Disposition:   Patient meets criteria for inpatient psychiatric admission.  [facility] notified and patient has been accepted   Ardis Hughs, NP 01/21/2022, 1:42 PM

## 2022-01-21 NOTE — Progress Notes (Signed)
BHH/BMU LCSW Progress Note   01/21/2022    1:40 PM  DEMETRICK EICHENBERGER   [id number]   Type of Contact and Topic:  Psychiatric Bed Placement   Pt accepted to [facility] 300[facility]    Patient meets inpatient criteria per Vernard Gambles, NP   The attending provider will be Alto Denver, MD  Call report to [phone]    Milas Hock, RN @ [facility] notified.     Pt scheduled  to arrive at [facility] TODAY at 2200. Please be sure to fax voluntary consent via e-mail prior to transporting the patient.    Damita Dunnings, MSW, LCSW-A  1:42 PM 01/21/2022

## 2022-01-21 NOTE — Progress Notes (Signed)
Patient talked with provider and given breakfast.

## 2022-01-21 NOTE — ED Notes (Signed)
Patient is sleeping in his bed. Patient respirations are even and unlabored. Will continue to monitor for safety.  ?

## 2022-01-21 NOTE — Progress Notes (Signed)
Patient signed voluntary consents, in agreement of continuing treatment at behavioral health hospital. Patient remains appropriate and cooperative, with flat affect. No observation of responding to internal stimuli. Patient given lunch. Staff will continue to monitor.

## 2022-01-22 ENCOUNTER — Other Ambulatory Visit: Payer: Self-pay

## 2022-01-22 ENCOUNTER — Encounter (HOSPITAL_COMMUNITY): Payer: Self-pay | Admitting: Psychiatry

## 2022-01-22 DIAGNOSIS — Z20822 Contact with and (suspected) exposure to covid-19: Secondary | ICD-10-CM | POA: Diagnosis not present

## 2022-01-22 DIAGNOSIS — Z23 Encounter for immunization: Secondary | ICD-10-CM | POA: Diagnosis not present

## 2022-01-22 DIAGNOSIS — F332 Major depressive disorder, recurrent severe without psychotic features: Secondary | ICD-10-CM | POA: Diagnosis present

## 2022-01-22 DIAGNOSIS — E871 Hypo-osmolality and hyponatremia: Secondary | ICD-10-CM | POA: Diagnosis not present

## 2022-01-22 DIAGNOSIS — F333 Major depressive disorder, recurrent, severe with psychotic symptoms: Secondary | ICD-10-CM | POA: Diagnosis present

## 2022-01-22 MED ORDER — ZIPRASIDONE MESYLATE 20 MG IM SOLR: 20.0000 mg | INTRAMUSCULAR | Status: DC | PRN

## 2022-01-22 MED ORDER — LORATADINE 10 MG PO TABS
10.0000 mg | ORAL_TABLET | Freq: Every day | ORAL | Status: DC
  Administered 2022-01-22 – 2022-02-01 (×11): 10 mg via ORAL
  Filled 2022-01-22 (×15): qty 1

## 2022-01-22 MED ORDER — MENTHOL 3 MG MT LOZG
1.0000 | LOZENGE | OROMUCOSAL | Status: DC | PRN
  Administered 2022-01-22 – 2022-01-24 (×2): 3 mg via ORAL
  Filled 2022-01-22: qty 9

## 2022-01-22 MED ORDER — OLANZAPINE 5 MG PO TBDP
5.0000 mg | ORAL_TABLET | Freq: Three times a day (TID) | ORAL | Status: DC | PRN
  Administered 2022-01-22 – 2022-01-29 (×3): 5 mg via ORAL
  Filled 2022-01-22 (×3): qty 1

## 2022-01-22 MED ORDER — HYDROXYZINE HCL 25 MG PO TABS: 25.0000 mg | ORAL_TABLET | Freq: Four times a day (QID) | ORAL | Status: DC | PRN

## 2022-01-22 MED ORDER — LORAZEPAM 1 MG PO TABS
1.0000 mg | ORAL_TABLET | Freq: Three times a day (TID) | ORAL | Status: AC
  Administered 2022-01-23 (×3): 1 mg via ORAL
  Filled 2022-01-22 (×3): qty 1

## 2022-01-22 MED ORDER — LORAZEPAM 1 MG PO TABS
1.0000 mg | ORAL_TABLET | Freq: Four times a day (QID) | ORAL | Status: AC
  Administered 2022-01-22 (×3): 1 mg via ORAL
  Filled 2022-01-22 (×3): qty 1

## 2022-01-22 MED ORDER — MAGNESIUM HYDROXIDE 400 MG/5ML PO SUSP
30.0000 mL | Freq: Every day | ORAL | Status: DC | PRN
  Administered 2022-01-29: 30 mL via ORAL
  Filled 2022-01-22: qty 30

## 2022-01-22 MED ORDER — FLUOXETINE HCL 20 MG PO CAPS
40.0000 mg | ORAL_CAPSULE | Freq: Every day | ORAL | Status: DC
  Administered 2022-01-22: 40 mg via ORAL
  Filled 2022-01-22 (×4): qty 2

## 2022-01-22 MED ORDER — ACETAMINOPHEN 325 MG PO TABS
650.0000 mg | ORAL_TABLET | Freq: Four times a day (QID) | ORAL | Status: DC | PRN
  Administered 2022-01-22 – 2022-01-31 (×8): 650 mg via ORAL
  Filled 2022-01-22 (×8): qty 2

## 2022-01-22 MED ORDER — INFLUENZA VAC SPLIT QUAD 0.5 ML IM SUSY
0.5000 mL | PREFILLED_SYRINGE | INTRAMUSCULAR | Status: AC
  Administered 2022-01-24: 0.5 mL via INTRAMUSCULAR
  Filled 2022-01-22: qty 0.5

## 2022-01-22 MED ORDER — ONDANSETRON 4 MG PO TBDP
ORAL_TABLET | ORAL | Status: AC
  Filled 2022-01-22: qty 1

## 2022-01-22 MED ORDER — LORAZEPAM 1 MG PO TABS
1.0000 mg | ORAL_TABLET | Freq: Every day | ORAL | Status: AC
  Administered 2022-01-25: 1 mg via ORAL
  Filled 2022-01-22: qty 1

## 2022-01-22 MED ORDER — ADULT MULTIVITAMIN W/MINERALS CH
1.0000 | ORAL_TABLET | Freq: Every day | ORAL | Status: DC
  Administered 2022-01-22 – 2022-02-01 (×11): 1 via ORAL
  Filled 2022-01-22 (×15): qty 1

## 2022-01-22 MED ORDER — AMLODIPINE BESYLATE 5 MG PO TABS
5.0000 mg | ORAL_TABLET | Freq: Every day | ORAL | Status: DC
  Administered 2022-01-22 – 2022-02-01 (×11): 5 mg via ORAL
  Filled 2022-01-22 (×14): qty 1

## 2022-01-22 MED ORDER — MIRTAZAPINE 15 MG PO TABS
15.0000 mg | ORAL_TABLET | Freq: Every day | ORAL | Status: DC
  Administered 2022-01-22 (×2): 15 mg via ORAL
  Filled 2022-01-22 (×5): qty 1

## 2022-01-22 MED ORDER — TRAZODONE HCL 50 MG PO TABS
50.0000 mg | ORAL_TABLET | Freq: Every evening | ORAL | Status: DC | PRN
  Administered 2022-01-22 – 2022-01-25 (×5): 50 mg via ORAL
  Filled 2022-01-22 (×5): qty 1

## 2022-01-22 MED ORDER — ALUM & MAG HYDROXIDE-SIMETH 200-200-20 MG/5ML PO SUSP
30.0000 mL | ORAL | Status: DC | PRN
  Administered 2022-01-24 – 2022-01-27 (×4): 30 mL via ORAL
  Filled 2022-01-22 (×4): qty 30

## 2022-01-22 MED ORDER — POTASSIUM CHLORIDE CRYS ER 20 MEQ PO TBCR
20.0000 meq | EXTENDED_RELEASE_TABLET | Freq: Once | ORAL | Status: AC
  Administered 2022-01-22: 20 meq via ORAL
  Filled 2022-01-22 (×2): qty 1

## 2022-01-22 MED ORDER — ONDANSETRON 4 MG PO TBDP
4.0000 mg | ORAL_TABLET | Freq: Four times a day (QID) | ORAL | Status: DC | PRN
  Administered 2022-01-22 – 2022-01-31 (×7): 4 mg via ORAL
  Filled 2022-01-22 (×6): qty 1

## 2022-01-22 MED ORDER — HYDROXYZINE HCL 25 MG PO TABS
25.0000 mg | ORAL_TABLET | Freq: Three times a day (TID) | ORAL | Status: DC | PRN
  Administered 2022-01-22: 25 mg via ORAL
  Filled 2022-01-22: qty 1

## 2022-01-22 MED ORDER — GUAIFENESIN ER 600 MG PO TB12
600.0000 mg | ORAL_TABLET | Freq: Two times a day (BID) | ORAL | Status: DC
  Administered 2022-01-22 – 2022-01-26 (×10): 600 mg via ORAL
  Filled 2022-01-22 (×16): qty 1

## 2022-01-22 MED ORDER — LOPERAMIDE HCL 2 MG PO CAPS: 2.0000 mg | ORAL_CAPSULE | ORAL | Status: AC | PRN

## 2022-01-22 MED ORDER — THIAMINE HCL 100 MG PO TABS
100.0000 mg | ORAL_TABLET | Freq: Every day | ORAL | Status: DC
  Administered 2022-01-23 – 2022-02-01 (×10): 100 mg via ORAL
  Filled 2022-01-22 (×13): qty 1

## 2022-01-22 MED ORDER — THIAMINE HCL 100 MG/ML IJ SOLN
100.0000 mg | Freq: Once | INTRAMUSCULAR | Status: AC
  Administered 2022-01-22: 100 mg via INTRAMUSCULAR
  Filled 2022-01-22: qty 2

## 2022-01-22 MED ORDER — LORAZEPAM 1 MG PO TABS
1.0000 mg | ORAL_TABLET | Freq: Two times a day (BID) | ORAL | Status: AC
  Administered 2022-01-24 (×2): 1 mg via ORAL
  Filled 2022-01-22 (×2): qty 1

## 2022-01-22 MED ORDER — BENZTROPINE MESYLATE 1 MG PO TABS
1.0000 mg | ORAL_TABLET | Freq: Two times a day (BID) | ORAL | Status: DC
  Administered 2022-01-22 – 2022-01-29 (×15): 1 mg via ORAL
  Filled 2022-01-22 (×20): qty 1

## 2022-01-22 NOTE — Progress Notes (Signed)
EKG completed and placed on the front of the chart  

## 2022-01-22 NOTE — BHH Counselor (Signed)
CSW attempted to complete PSA with pt but pt stated he was not feeling well and asked CSW to come back at a later time.   Fredirick Lathe, LCSWA Clinicial Social Worker [employer]

## 2022-01-22 NOTE — Tx Team (Signed)
Initial Treatment Plan 01/22/2022 2:23 AM Joseph Curtis [id number]    PATIENT STRESSORS: Financial difficulties   Health problems   Substance abuse   Traumatic event     PATIENT STRENGTHS: Capable of independent living  General fund of knowledge  Supportive family/friends    PATIENT IDENTIFIED PROBLEMS: Depression  Anxiety  Suicidal ideation    "Get my mind right"  "Get housing straightened out"           DISCHARGE CRITERIA:  Improved stabilization in mood, thinking, and/or behavior Need for constant or close observation no longer present Reduction of life-threatening or endangering symptoms to within safe limits Verbal commitment to aftercare and medication compliance  PRELIMINARY DISCHARGE PLAN: Outpatient therapy Medication management  PATIENT/FAMILY INVOLVEMENT: This treatment plan has been presented to and reviewed with the patient, Joseph Curtis.  The patient and family have been given the opportunity to ask questions and make suggestions.  Windell Moment, RN 01/22/2022, 2:23 AM

## 2022-01-22 NOTE — Progress Notes (Signed)
°   01/22/22 2120  Psych Admission Type (Psych Patients Only)  Admission Status Voluntary  Psychosocial Assessment  Patient Complaints Self-harm thoughts;Other (Comment);Depression;Anxiety (withdrawal symptoms from regular Xanax use last 25 yrs)  Eye Contact Brief  Facial Expression Flat;Sad  Affect Depressed;Anxious  Speech Logical/coherent;Soft  Interaction Assertive;Minimal  Motor Activity Slow (chronic pain)  Appearance/Hygiene Unremarkable  Behavior Characteristics Cooperative;Appropriate to situation;Anxious  Mood Depressed;Anxious;Sad;Pleasant  Thought Process  Coherency WDL  Content Blaming self  Delusions None reported or observed  Perception Hallucinations  Hallucination Auditory (voice telling him that his wife won't survive without him here)  Judgment Poor  Confusion None  Danger to Self  Current suicidal ideation? Passive  Description of Suicide Plan denies plan  Self-Injurious Behavior Some self-injurious ideation observed or expressed.  No lethal plan expressed   Agreement Not to Harm Self Yes  Description of Agreement Verbal agreement to not harm self  Danger to Others  Danger to Others None reported or observed   Pt seen after group this evening. Pt endorses passive SI without a plan. Pt contracts for safety with staff while at [facility]. Pt states that he thinks his family will be better off without him here. Pt says his wife told him that she needs him and so do his grandchildren. "I wouldn't do anything to hurt myself because of them. The thoughts are still there." Pt denies HI, VH. Pt endorses AH that tell him that his wife won't be safe if he is not with her. Pt rates anxiety 7/10 and depression 8/10. Has been living with his wife in their car for the last year and a half. Pt has encountered roadblocks with trying to get disability and housing support.Pt encouraged to speak with social worker here at [facility].  Pt endorses nausea, tremors, headache, anxiety and sweats.  CIWA=8. Pt on Ativan taper. Pt has sad depressed mood. Pt rates pain 9/10 as chronic pain in hips, shoulder left and knees.

## 2022-01-22 NOTE — Progress Notes (Signed)
Pt at nurse's station c/o voices not allowing him to sleep. Pt given PRNs as needed. Will continue to monitor.

## 2022-01-22 NOTE — Progress Notes (Signed)
Joseph Curtis is a 61 year old male being admitted voluntarily to 300-2 from [facility].  He presented to [facility] accompanied by his wife.  Pt's wife reports, yesterday she found him walking towards a tree with a noose hanging.  She was able to encourage him to get help.  She also reports, that he had a very severe panic attack and then he started seeing different colors, hearing voices and his face was changing colors. Current stressors are being homeless and living out of their car.  He is also grieving the death of a close friend that passed away a few days ago.  He has not been taking his medications on a regular basis.  He did report HI and wants to hurt people that wronged him.  He has history of multiple psychiatric hospitalizations.   During [facility] admission, he was pleasant and cooperative.  He continues to report passive SI but verbally agrees to not harm himself on the unit.  He denies HI or AVH.  Affect flat and sad.  He reports that his current stressors are being homeless, financial issues and worrying about his wife who is sleeping in their car alone.  He states that he and his wife recently sold their home in [place] with plan to move to [place] to be closer to family.  Since moving here, they have been unable to afford rent in the area thus having to live in their car.  Oriented him to the unit. Admission paperwork completed and signed.  Belongings searched and secured in [place] [address].  Skin assessment completed and no skin issues noted.  No contraband found.  Suicide safety plan reviewed, given to patient to complete and return to his nurse.  Q 15-minute checks initiated for safety.

## 2022-01-22 NOTE — Group Note (Signed)
Recreation Therapy Group Note   Group Topic:Animal Assisted Therapy   Group Date: 01/22/2022 Start Time: 1430 End Time: 1510 Facilitators: Vikki Ports, NT Location: 300 Hall Dayroom   Animal-Assisted Activity (AAA) Program Checklist/Progress Note Patient Eligibility Criteria Checklist & Daily Group note for Rec Tx Intervention  AAA/T Program Assumption of Risk Form signed by Patient/ or Parent Legal Guardian YES  Patient is free of allergies or severe asthma  YES  Patient reports no fear of animals YES  Patient reports no history of cruelty to animals YES  Patient understands their participation is voluntary YES  Patient washes hands before animal contact YES  Patient washes hands after animal contact YES   Group Description: Patients provided opportunity to interact with trained and credentialed [employer] Therapy dog and the community volunteer/dog handler. Patients practiced appropriate animal interaction and were educated on dog safety outside of the hospital in common community settings. Patients were allowed to use dog toys and other items to practice commands, engage the dog in play, and/or complete routine aspects of animal care.   Education: Contractor, Pensions consultant, Communication & Social Skills    Affect/Mood: Appropriate   Participation Level: Engaged    Clinical Observations/Individualized Feedback:  Pt attended, participated and shared stories of their animals at home.  Pt also engaged with dog team by asking questions to gain information about dog training, service dogs and therapy dogs.    Plan: Continue to engage patient in RT group sessions 2-3x/week.   Victorino Sparrow, LRT,CTRS 01/22/2022 3:30 PM

## 2022-01-22 NOTE — BHH Group Notes (Incomplete)
BHH Group Notes:  (Nursing/MHT/Case Management/Adjunct)  Date:  01/22/2022  Time:  8:58 PM  Type of Therapy:   Wrap-up Group  Participation Level:  Active  Participation Quality:  Appropriate and Attentive  Affect:  Appropriate  Cognitive:  Alert and Appropriate  Insight:  Appropriate, Good, and Improving  Engagement in Group:  Developing/Improving and Improving  Modes of Intervention:  Discussion  Summary of Progress/Problems:  Lorita Officer 01/22/2022, 8:58 PM

## 2022-01-22 NOTE — Progress Notes (Signed)
Pt denies SI/HI/AVH and verbally agrees to approach staff if these become apparent or before harming themselves/others. Rates depression 8/10. Rates anxiety 6/10. Rates pain 9/10.  Pt is suspect to be withdrawing from xanax. CIWA orders in place. Pt has received PRNs. Pt has been laying in his room for the majority of the day. Pt did sit in the dayroom while 300 hall was closed for cleaning/fixing. Scheduled medications administered to pt, per MD orders. RN provided support and encouragement to pt. Q15 min safety checks implemented and continued. Pt safe on the unit. RN will continue to monitor and intervene as needed.   01/22/22 0828  Psych Admission Type (Psych Patients Only)  Admission Status Voluntary  Psychosocial Assessment  Patient Complaints Anxiety;Depression  Eye Contact Brief  Facial Expression Flat;Sad  Affect Depressed;Anxious  Speech Logical/coherent;Soft  Interaction Minimal;Assertive  Motor Activity Slow  Appearance/Hygiene Unremarkable  Behavior Characteristics Cooperative;Anxious;Unable to participate  Mood Anxious;Depressed;Sad;Pleasant  Thought Process  Coherency WDL  Content WDL  Delusions None reported or observed  Perception WDL  Hallucination None reported or observed  Judgment Impaired  Confusion None  Danger to Self  Current suicidal ideation? Denies  Agreement Not to Harm Self Yes  Description of Agreement Verbal agreement to not harm self  Danger to Others  Danger to Others None reported or observed
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
## 2022-01-22 NOTE — [facility] Suicide Risk Assessment (Signed)
Suicide Risk Assessment  Admission Assessment    [facility] Admission Suicide Risk Assessment   Nursing information obtained from:  Patient Demographic factors:  Male, Caucasian, Low socioeconomic status, Unemployed Current Mental Status:  Suicidal ideation indicated by patient Loss Factors:  Financial problems / change in socioeconomic status Historical Factors:  NA Risk Reduction Factors:  Living with another person, especially a relative  Total Time spent with patient: 1 hour Principal Problem: Severe recurrent major depressive disorder with psychotic features (HCC) Diagnosis:  Principal Problem:   Severe recurrent major depressive disorder with psychotic features (HCC) Active Problems:   MDD (major depressive disorder), severe (HCC)   MDD (major depressive disorder), recurrent episode, severe (HCC)  History of Present Illness: As per [facility] assessment on 01/20/22, "Joseph Curtis is 61 year old male who presents voluntary and accompanied by his wife [name] to [place]. Clinician asked the pt, "what brought you to the hospital?" Pt reports, he's depressed. Pt's wife reports, yesterday she caught the pt with a noose hanging in a tree and he was walking towards the tree but she stopped him. Per wife, she talked to the pt and encouraged him to get help". Pt's wife also reported that pt was having +SI/HI/AVH. Pt was deemed to be a danger to himself and others, and was transferred to this [facility] for treatment and stabilization of his mood.    Continued Clinical Symptoms: Pt reports +AVH, feelings of worthlessness, hopelessness, helplessness,low energy, poor appetite, insomnia & recurrent thoughts of death which started two years ago and worsened two weeks ago. Pt also reports having panic attacks 2-3 times/week. Pt also reporting +HI towards people who wronged him. Pt is currently a danger to himself and others, and continuous, hospitalization is necessary to treat and stabilize his mood.  Alcohol  Use Disorder Identification Test Final Score (AUDIT): 0 The "Alcohol Use Disorders Identification Test", Guidelines for Use in Primary Care, Second Edition.  World Science writer [facility]). Score between 0-7:  no or low risk or alcohol related problems. Score between 8-15:  moderate risk of alcohol related problems. Score between 16-19:  high risk of alcohol related problems. Score 20 or above:  warrants further diagnostic evaluation for alcohol dependence and treatment.  CLINICAL FACTORS:   Panic Attacks Depression:   Anhedonia Hopelessness Impulsivity Insomnia Severe Previous Psychiatric Diagnoses and Treatments  Musculoskeletal: Strength & Muscle Tone: within normal limits Gait & Station: normal Patient leans: N/A  Psychiatric Specialty Exam:  Presentation  General Appearance: Disheveled  Eye Contact:Fair  Speech:Clear and Coherent  Speech Volume:Normal  Handedness:Right  Mood and Affect  Mood:Depressed  Affect:Flat  Thought Process  Thought Processes:Coherent  Descriptions of Associations:Intact  Orientation:Full (Time, Place and Person)  Thought Content:Logical  History of Schizophrenia/Schizoaffective disorder:No data recorded Duration of Psychotic Symptoms:No data recorded Hallucinations:Hallucinations: Auditory; Visual  Ideas of Reference:None  Suicidal Thoughts:Suicidal Thoughts: No SI Passive Intent and/or Plan: Without Intent  Homicidal Thoughts:Homicidal Thoughts: Yes, Passive HI Passive Intent and/or Plan: Without Intent  Sensorium  Memory:Immediate Good; Recent Good  Judgment:Poor  Insight:Poor  Executive Functions  Concentration:Fair  Attention Span:Fair  Recall:Fair  Fund of Knowledge:Fair  Language:Fair  Psychomotor Activity  Psychomotor Activity:Psychomotor Activity: Normal  Assets  Assets:Communication Skills  Sleep  Sleep:Sleep: Poor  Physical Exam: Physical Exam Constitutional:      Appearance: He is  ill-appearing.  HENT:     Head: Normocephalic.     Nose: Nose normal.  Eyes:     Pupils: Pupils are equal, round,  and reactive to light.  Pulmonary:     Effort: Pulmonary effort is normal.  Abdominal:     Hernia: A hernia is present.  Musculoskeletal:        General: Normal range of motion.     Cervical back: Normal range of motion.  Neurological:     Mental Status: He is oriented to person, place, and time.     Sensory: No sensory deficit.  Psychiatric:        Behavior: Behavior normal.        Thought Content: Thought content normal.   Review of Systems  Constitutional: Negative.  Negative for fever.  HENT: Negative.  Negative for congestion and sore throat.   Eyes: Negative.   Respiratory: Negative.  Negative for cough and shortness of breath.   Cardiovascular: Negative.  Negative for chest pain.  Gastrointestinal:  Positive for abdominal pain, nausea and vomiting.  Genitourinary: Negative.   Musculoskeletal:  Positive for back pain, joint pain and myalgias.  Skin: Negative.   Neurological:  Positive for headaches.  Endo/Heme/Allergies:        Allergies -PCN -Codeine -Bee venom -Peanuts  Psychiatric/Behavioral:  Positive for depression, hallucinations, substance abuse and suicidal ideas. The patient is nervous/anxious and has insomnia.   Blood pressure (!) 142/86, pulse 77, temperature 98.5 F (36.9 C), temperature source Oral, resp. rate 18, height 5\' 7"  (1.702 m), weight 61.7 kg, SpO2 98 %. Body mass index is 21.3 kg/m.  COGNITIVE FEATURES THAT CONTRIBUTE TO RISK:  None    SUICIDE RISK:   Moderate:  Frequent suicidal ideation with limited intensity, and duration, some specificity in terms of plans, no associated intent, good self-control, limited dysphoria/symptomatology, some risk factors present, and identifiable protective factors, including available and accessible social support.  Physician Treatment Plan for Primary Diagnosis: Severe recurrent major  depressive disorder with psychotic features (HCC)   Long Term Goal(s): Improvement in symptoms so as ready for discharge   Short Term Goals: Ability to identify changes in lifestyle to reduce recurrence of condition will improve, Ability to verbalize feelings will improve, Ability to disclose and discuss suicidal ideas, Ability to demonstrate self-control will improve, Ability to maintain clinical measurements within normal limits will improve, Compliance with prescribed medications will improve, and Ability to identify triggers associated with substance abuse/mental health issues will improve   Physician Treatment Plan for Secondary Diagnosis:  Principal Problem:   Severe recurrent major depressive disorder with psychotic features (HCC) Active Problems:   MDD (major depressive disorder), severe (HCC)   MDD (major depressive disorder), recurrent episode, severe (HCC)   Safety and Monitoring: Voluntary admission to inpatient psychiatric unit for safety, stabilization and treatment Daily contact with patient to assess and evaluate symptoms and progress in treatment Patient's case to be discussed in multi-disciplinary team meeting Observation Level : q15 minute checks Vital signs: q12 hours Precautions: suicide, elopement, and assault   Medications MDD with Psychotic Features -Discontinue Prozac 40 mg daily (plan is to replace with Zoloft which is more appropriate for pt's age since Prozac has a long half life. Will add this med in a day/two since pt is actively withdrawing from Benzos)   -Continue Remeron 15 mg nightly   Anxiety/CIWA < or =10 -Continue Hydroxyzine 25 mg every 6 hours PRN   Continue CIWA & Ativan Detox Protocol (see MAR for details)   Insomnia Continue Trazodone 50 mg nightly PRN    Nutritional support -Continue Multivitamin daily -Continue thiamine 100 mg daily   Seasonal allergies -Continue  Claritin 10 mg daily   Other PRNS -Continue Tylenol 650 mg every 6  hours PRN for mild pain -Continue Maalox 30 mg every 4 hrs PRN for indigestion -Continue Milk of Magnesia as needed every 6 hrs for constipation -Continue Zofran disintegrating tabs every 6 hrs PRN for nausea  -Start Agitation Protocol (Zyprexa/Geodon PRN)   Discharge Planning: Social work and case management to assist with discharge planning and identification of hospital follow-up needs prior to discharge Estimated LOS: 5-7 days Discharge Concerns: Need to establish a safety plan; Medication compliance and effectiveness Discharge Goals: Return home with outpatient referrals for mental health follow-up including medication management/psychotherapy  I certify that inpatient services furnished can reasonably be expected to improve the patient's condition.   Starleen Blue, NP 01/22/2022, 5:26 PM

## 2022-01-23 ENCOUNTER — Encounter (HOSPITAL_COMMUNITY): Payer: Self-pay

## 2022-01-23 DIAGNOSIS — F333 Major depressive disorder, recurrent, severe with psychotic symptoms: Principal | ICD-10-CM

## 2022-01-23 LAB — COMPREHENSIVE METABOLIC PANEL
ALT: 24 U/L (ref 0–44)
AST: 31 U/L (ref 15–41)
Albumin: 4 g/dL (ref 3.5–5.0)
Alkaline Phosphatase: 70 U/L (ref 38–126)
Anion gap: 8 (ref 5–15)
BUN: 24 mg/dL — ABNORMAL HIGH (ref 6–20)
CO2: 28 mmol/L (ref 22–32)
Calcium: 9.4 mg/dL (ref 8.9–10.3)
Chloride: 104 mmol/L (ref 98–111)
Creatinine, Ser: 1.28 mg/dL — ABNORMAL HIGH (ref 0.61–1.24)
GFR, Estimated: >60 mL/min (ref >60)
Glucose, Bld: 147 mg/dL — ABNORMAL HIGH (ref 70–99)
Potassium: 4.4 mmol/L (ref 3.5–5.1)
Sodium: 140 mmol/L (ref 135–145)
Total Bilirubin: 0.1 mg/dL — ABNORMAL LOW (ref 0.3–1.2)
Total Protein: 7 g/dL (ref 6.5–8.1)

## 2022-01-23 MED ORDER — TRAZODONE HCL 50 MG PO TABS
50.0000 mg | ORAL_TABLET | Freq: Once | ORAL | Status: AC
  Administered 2022-01-24: 50 mg via ORAL
  Filled 2022-01-23 (×2): qty 1

## 2022-01-23 MED ORDER — SERTRALINE HCL 50 MG PO TABS
50.0000 mg | ORAL_TABLET | Freq: Every day | ORAL | Status: DC
  Administered 2022-01-24 – 2022-02-01 (×9): 50 mg via ORAL
  Filled 2022-01-23 (×11): qty 1

## 2022-01-23 MED ORDER — METHOCARBAMOL 500 MG PO TABS
500.0000 mg | ORAL_TABLET | Freq: Four times a day (QID) | ORAL | Status: DC | PRN
  Administered 2022-01-23 – 2022-01-29 (×6): 500 mg via ORAL
  Filled 2022-01-23 (×7): qty 1

## 2022-01-23 MED ORDER — MIRTAZAPINE 30 MG PO TABS
30.0000 mg | ORAL_TABLET | Freq: Every day | ORAL | Status: DC
  Administered 2022-01-23 – 2022-01-25 (×3): 30 mg via ORAL
  Filled 2022-01-23 (×5): qty 1

## 2022-01-23 MED ORDER — HYDROXYZINE HCL 50 MG PO TABS
50.0000 mg | ORAL_TABLET | Freq: Four times a day (QID) | ORAL | Status: AC | PRN
  Administered 2022-01-23: 50 mg via ORAL
  Filled 2022-01-23: qty 1

## 2022-01-23 NOTE — Progress Notes (Addendum)
[facility] MD Progress Note  01/23/2022 1:13 PM MARCIS PETTEYS  MRN:  [id number]  Subjective:  [name] states: "I can kill myself in so many different ways, but I don't want to die. I want to be here for my grand kids and my family."  Daily Notes, 01/23/2022: Pt with flat affect and depressed mood, attention to personal hygiene and grooming is poor, eye contact is fair, speech is clear & coherent. Thought contents are organized and logical, and pt currently endorses suicidal thoughts, denies having a plan, but states that he knows of so many ways to kill himself, but has no intent to do so because he wants to live because he loves his family and his grand children. Pt denies HI. He reports that he is continuing to have visual hallucinations and saw shadows looking at him yesterday. He also states that he has auditory hallucinations of voices echoing as if they are in a tunnel. Pt denies paranoia, and there are currently no delusional thoughts. Pt rates his depression as 10 (10 being worst), and rates his anxiety as 8 (10 being the worst).   Pt continues report feeling nauseous, and is being medicated with Zofran PRN. Pt offered Gabapentin for anxiety, but declined stating that it makes him feel drunk. He is agreeable to an increase on the dose of his Vistaril. Pt complaining of restlessness while sleeping leading to a poor sleep quality. He is agreeable to an increase in the dose of his Remeron to 30 mg nightly to manage his depressive symptoms, insomnia and poor appetite. Will also add Zoloft 50 mg daily effective tomorrow morning (01/24/22). He is reporting a poor appetite. He complains of generalized body pains, states that his shoulders, back and elbows are painful, and that Tylenol is not helpful. Will add Robaxin 500 mg Q6H PRN to pt's medication regimen.   V/S are WNL, pt denies any abnormal movements of any of his extremities, and is currently on the Ativan detox protocol. Baseline UA ordered and  pending.  Principal Problem: Severe recurrent major depressive disorder with psychotic features ([place]) Diagnosis: Principal Problem:   Severe recurrent major depressive disorder with psychotic features ([place]) Active Problems:   MDD (major depressive disorder), severe (HCC)   MDD (major depressive disorder), recurrent episode, severe ([place])  Total Time spent with patient: 20 minutes  Past Psychiatric History: As above  Past Medical History:  Past Medical History:  Diagnosis Date   Anxiety    Arthritis    Back pain    chronic   Depression    Fall    from construction site   Headache    Hypertension    Legg-Calve-Perthes disease, bilateral    Neck pain    chronic   Panic attacks    Pneumonia     Past Surgical History:  Procedure Laterality Date   HERNIA REPAIR     JOINT REPLACEMENT     right knee, left shoulder   ORCHIECTOMY Left    Family History: History reviewed. No pertinent family history. Family Psychiatric  History: None reported Social History:  Social History   Substance and Sexual Activity  Alcohol Use Not Currently     Social History   Substance and Sexual Activity  Drug Use Not Currently   Types: Cocaine, Heroin, Marijuana   Comment: states stopped using drugs few months back.    Social History   Socioeconomic History   Marital status: Married    Spouse name: Not on file   Number  of children: Not on file   Years of education: Not on file   Highest education level: Not on file  Occupational History   Not on file  Tobacco Use   Smoking status: Every Day    Packs/day: 0.50    Years: 30.00    Pack years: 15.00    Types: Cigarettes   Smokeless tobacco: Never  Vaping Use   Vaping Use: Never used  Substance and Sexual Activity   Alcohol use: Not Currently   Drug use: Not Currently    Types: Cocaine, Heroin, Marijuana    Comment: states stopped using drugs few months back.   Sexual activity: Not Currently  Other Topics Concern   Not on file   Social History Narrative   Not on file   Social Determinants of Health   Financial Resource Strain: Not on file  Food Insecurity: Not on file  Transportation Needs: Not on file  Physical Activity: Not on file  Stress: Not on file  Social Connections: Not on file   Sleep: Poor  Appetite:  Poor  Current Medications: Current Facility-Administered Medications  Medication Dose Route Frequency Provider Last Rate Last Admin   acetaminophen (TYLENOL) tablet 650 mg  650 mg Oral Q6H PRN Revonda Humphrey, NP   650 mg at 01/22/22 2115   alum & mag hydroxide-simeth (MAALOX/MYLANTA) 200-200-20 MG/5ML suspension 30 mL  30 mL Oral Q4H PRN Revonda Humphrey, NP       amLODipine (NORVASC) tablet 5 mg  5 mg Oral Daily Revonda Humphrey, NP   5 mg at 01/23/22 0900   benztropine (COGENTIN) tablet 1 mg  1 mg Oral BID Revonda Humphrey, NP   1 mg at 01/23/22 0900   guaiFENesin (MUCINEX) 12 hr tablet 600 mg  600 mg Oral Q12H Revonda Humphrey, NP   600 mg at 01/23/22 0900   hydrOXYzine (ATARAX) tablet 50 mg  50 mg Oral Q6H PRN Nicholes Rough, NP       influenza vac split quadrivalent PF (FLUARIX) injection 0.5 mL  0.5 mL Intramuscular Tomorrow-1000 Lovena Le, Cody W, PA-C       loperamide (IMODIUM) capsule 2-4 mg  2-4 mg Oral PRN Nicholes Rough, NP       loratadine (CLARITIN) tablet 10 mg  10 mg Oral Daily Revonda Humphrey, NP   10 mg at 01/23/22 0900   LORazepam (ATIVAN) tablet 1 mg  1 mg Oral TID Nicholes Rough, NP   1 mg at 01/23/22 1300   Followed by   Derrill Memo ON 01/24/2022] LORazepam (ATIVAN) tablet 1 mg  1 mg Oral BID Nicholes Rough, NP       Followed by   Derrill Memo ON 01/25/2022] LORazepam (ATIVAN) tablet 1 mg  1 mg Oral Daily Kaijah Abts, NP       magnesium hydroxide (MILK OF MAGNESIA) suspension 30 mL  30 mL Oral Daily PRN Revonda Humphrey, NP       menthol-cetylpyridinium (CEPACOL) lozenge 3 mg  1 lozenge Oral PRN Massengill, Ovid Curd, MD   3 mg at 01/22/22 1941   methocarbamol (ROBAXIN)  tablet 500 mg  500 mg Oral Q6H PRN Nicholes Rough, NP       mirtazapine (REMERON) tablet 30 mg  30 mg Oral QHS Xavion Muscat, NP       multivitamin with minerals tablet 1 tablet  1 tablet Oral Daily Aracelie Addis, NP   1 tablet at 01/23/22 0900   OLANZapine zydis (ZYPREXA) disintegrating tablet 5 mg  5 mg Oral Q8H PRN Nicholes Rough, NP   5 mg at 01/22/22 2308   And   ziprasidone (GEODON) injection 20 mg  20 mg Intramuscular PRN Nicholes Rough, NP       ondansetron (ZOFRAN-ODT) disintegrating tablet 4 mg  4 mg Oral Q6H PRN Nicholes Rough, NP   4 mg at 01/22/22 2125   thiamine tablet 100 mg  100 mg Oral Daily Kaleo Condrey, NP   100 mg at 01/23/22 0900   traZODone (DESYREL) tablet 50 mg  50 mg Oral QHS PRN Revonda Humphrey, NP   50 mg at 01/22/22 2307    Lab Results: No results found for this or any previous visit (from the past 55 hour(s)).  Blood Alcohol level:  Lab Results  Component Value Date   ETH <10 01/20/2022   ETH <10 0000000    Metabolic Disorder Labs: Lab Results  Component Value Date   HGBA1C 5.1 01/20/2022   MPG 99.67 01/20/2022   No results found for: PROLACTIN Lab Results  Component Value Date   CHOL 199 01/20/2022   TRIG 70 01/20/2022   HDL 45 01/20/2022   CHOLHDL 4.4 01/20/2022   VLDL 14 01/20/2022   LDLCALC 140 (H) 01/20/2022    Physical Findings: AIMS: Facial and Oral Movements Muscles of Facial Expression: None, normal Lips and Perioral Area: None, normal Jaw: None, normal Tongue: None, normal,Extremity Movements Upper (arms, wrists, hands, fingers): None, normal Lower (legs, knees, ankles, toes): None, normal, Trunk Movements Neck, shoulders, hips: None, normal, Overall Severity Severity of abnormal movements (highest score from questions above): None, normal Incapacitation due to abnormal movements: None, normal Patient's awareness of abnormal movements (rate only patient's report): No Awareness, Dental Status Current problems with teeth  and/or dentures?: No Does patient usually wear dentures?: No  CIWA:  CIWA-Ar Total: 8 COWS:     Musculoskeletal: Strength & Muscle Tone: within normal limits Gait & Station: normal Patient leans: N/A  Psychiatric Specialty Exam:  Presentation  General Appearance: Disheveled  Eye Contact:Fair  Speech:Clear and Coherent  Speech Volume:Normal  Handedness:Right  Mood and Affect  Mood:Depressed  Affect:Congruent  Thought Process  Thought Processes:Coherent  Descriptions of Associations:Intact  Orientation:Full (Time, Place and Person)  Thought Content:Logical  History of Schizophrenia/Schizoaffective disorder:No data recorded Duration of Psychotic Symptoms:No data recorded Hallucinations:Hallucinations: Auditory; Visual  Ideas of Reference:None  Suicidal Thoughts:Suicidal Thoughts: Yes, Active SI Active Intent and/or Plan: Without Plan; Without Intent SI Passive Intent and/or Plan: Without Intent; Without Plan  Homicidal Thoughts:Homicidal Thoughts: No HI Passive Intent and/or Plan: Without Intent  Sensorium  Memory:Immediate Good; Recent Good  Judgment:Fair  Insight:Fair  Executive Functions  Concentration:Fair  Attention Span:Fair  [place]  Psychomotor Activity  Psychomotor Activity:Psychomotor Activity: Normal  Assets  Assets:Communication Skills  Sleep  Sleep:Sleep: Poor  Physical Exam: Physical Exam HENT:     Head: Normocephalic.     Nose: Nose normal. No congestion.  Eyes:     Pupils: Pupils are equal, round, and reactive to light.  Pulmonary:     Effort: Pulmonary effort is normal. No respiratory distress.  Musculoskeletal:        General: No swelling. Normal range of motion.     Cervical back: Normal range of motion. No rigidity.  Neurological:     Mental Status: He is alert and oriented to person, place, and time.     Sensory: No sensory deficit.   Review of Systems   Constitutional: Negative.  Negative  for fever.  HENT: Negative.  Negative for sore throat.   Eyes: Negative.   Respiratory: Negative.  Negative for cough, shortness of breath and wheezing.   Cardiovascular: Negative.  Negative for chest pain and palpitations.  Gastrointestinal: Negative.  Negative for heartburn, nausea and vomiting.  Genitourinary: Negative.   Musculoskeletal:  Positive for back pain, joint pain and myalgias.  Skin: Negative.   Neurological: Negative.  Negative for dizziness, tingling, tremors and headaches.  Endo/Heme/Allergies:        Allergies Bee vernom PCN Codeine Peanuts  Psychiatric/Behavioral:  Positive for depression, hallucinations, substance abuse and suicidal ideas. The patient is nervous/anxious and has insomnia.   Blood pressure 138/77, pulse 100, temperature (!) 97.4 F (36.3 C), temperature source Oral, resp. rate 18, height 5\' 7"  (1.702 m), weight 61.7 kg, SpO2 98 %. Body mass index is 21.3 kg/m.  Physician Treatment Plan for Primary Diagnosis: Severe recurrent major depressive disorder with psychotic features ([place])   Long Term Goal(s): Improvement in symptoms so as ready for discharge   Short Term Goals: Ability to identify changes in lifestyle to reduce recurrence of condition will improve, Ability to verbalize feelings will improve, Ability to disclose and discuss suicidal ideas, Ability to demonstrate self-control will improve, Ability to maintain clinical measurements within normal limits will improve, Compliance with prescribed medications will improve, and Ability to identify triggers associated with substance abuse/mental health issues will improve   Physician Treatment Plan for Secondary Diagnosis:  Principal Problem:   Severe recurrent major depressive disorder with psychotic features ([place]) Active Problems:   MDD (major depressive disorder), severe (HCC)   MDD (major depressive disorder), recurrent episode, severe ([place])   Safety and  Monitoring: Voluntary admission to inpatient psychiatric unit for safety, stabilization and treatment Daily contact with patient to assess and evaluate symptoms and progress in treatment Patient's case to be discussed in multi-disciplinary team meeting Observation Level : q15 minute checks Vital signs: q12 hours Precautions: suicide, elopement, and assault   Medications MDD with Psychotic Features -Discontinued Prozac 40 mg on 01/22/22. -Start Zoloft 50 mg daily (start on 01/24/22)   -Increase Remeron to 30 mg nightly   Anxiety/CIWA < or =10 -Increase Hydroxyzine to 50 mg every 6 hours PRN   Continue CIWA & Ativan Detox Protocol (see MAR for details)   Insomnia Continue Trazodone 50 mg nightly PRN    Nutritional support -Continue Multivitamin daily -Continue thiamine 100 mg daily   Seasonal allergies -Continue Claritin 10 mg daily   Other PRNS -Continue Tylenol 650 mg every 6 hours PRN for mild pain -Continue Maalox 30 mg every 4 hrs PRN for indigestion -Continue Milk of Magnesia as needed every 6 hrs for constipation -Continue Zofran disintegrating tabs every 6 hrs PRN for nausea  -Continue Agitation Protocol (Zyprexa/Geodon PRN) see MAR -Start Robaxin 500 mg Q6H PRN for back/shoulder pain  Discharge Planning: Social work and case management to assist with discharge planning and identification of hospital follow-up needs prior to discharge Estimated LOS: 5-7 days Discharge Concerns: Need to establish a safety plan; Medication compliance and effectiveness Discharge Goals: Return home with outpatient referrals for mental health follow-up including medication management/psychotherapy    Nicholes Rough, NP 01/23/2022, 1:13 PM

## 2022-01-23 NOTE — Group Note (Signed)
Recreation Therapy Group Note   Group Topic:Stress Management  Group Date: 01/23/2022 Start Time: 0932 End Time: 0958 Facilitators: Caroll Rancher, [place] Location: [address] Dayroom   Goal Area(s) Addresses:  Patient will identify positive stress management techniques. Patient will identify benefits of using stress management post d/c.  Group Description:  Stress Release Meditation.  LRT played a meditation that focused on breathing and releasing tension through tensing and releasing the muscles.  Patients were to focus on the meditation to fully engage.    Affect/Mood: N/A   Participation Level: Did not attend    Clinical Observations/Individualized Feedback:      Plan: Continue to engage patient in RT group sessions 2-3x/week.   Caroll Rancher, LRT,CTRS 01/23/2022 12:11 PM

## 2022-01-23 NOTE — Group Note (Signed)
[facility] LCSW Group Therapy Note   Group Date: 01/23/2022 Start Time: 1300 End Time: 1400   Type of Therapy/Topic:  Group Therapy:  Emotion Regulation  Participation Level:  Did Not Attend     Description of Group:    The purpose of this group is to assist patients in learning to regulate negative emotions and experience positive emotions. Patients will be guided to discuss ways in which they have been vulnerable to their negative emotions. These vulnerabilities will be juxtaposed with experiences of positive emotions or situations, and patients challenged to use positive emotions to combat negative ones. Special emphasis will be placed on coping with negative emotions in conflict situations, and patients will process healthy conflict resolution skills.  Therapeutic Goals: Patient will identify two positive emotions or experiences to reflect on in order to balance out negative emotions:  Patient will label two or more emotions that they find the most difficult to experience:  Patient will be able to demonstrate positive conflict resolution skills through discussion or role plays:   Summary of Patient Progress:   Did Not Attend    Therapeutic Modalities:   Cognitive Behavioral Therapy Feelings Identification Dialectical Behavioral Therapy   Felizardo Hoffmann, LCSWA

## 2022-01-23 NOTE — Plan of Care (Signed)
Nurse discussed coping skills with patient.  

## 2022-01-23 NOTE — BH IP Treatment Plan (Signed)
Interdisciplinary Treatment and Diagnostic Plan Update  01/23/2022 Time of Session: 9:00am  Joseph Curtis MRN: [id number]  Principal Diagnosis: Severe recurrent major depressive disorder with psychotic features [facility])  Secondary Diagnoses: Principal Problem:   Severe recurrent major depressive disorder with psychotic features ([place]) Active Problems:   MDD (major depressive disorder), severe ([place])   MDD (major depressive disorder), recurrent episode, severe ([place])   Current Medications:  Current Facility-Administered Medications  Medication Dose Route Frequency Provider Last Rate Last Admin   acetaminophen (TYLENOL) tablet 650 mg  650 mg Oral Q6H PRN Revonda Humphrey, NP   650 mg at 01/22/22 2115   alum & mag hydroxide-simeth (MAALOX/MYLANTA) 200-200-20 MG/5ML suspension 30 mL  30 mL Oral Q4H PRN Revonda Humphrey, NP       amLODipine (NORVASC) tablet 5 mg  5 mg Oral Daily Revonda Humphrey, NP   5 mg at 01/23/22 0900   benztropine (COGENTIN) tablet 1 mg  1 mg Oral BID Revonda Humphrey, NP   1 mg at 01/23/22 0900   guaiFENesin (MUCINEX) 12 hr tablet 600 mg  600 mg Oral Q12H Revonda Humphrey, NP   600 mg at 01/23/22 0900   hydrOXYzine (ATARAX) tablet 50 mg  50 mg Oral Q6H PRN Nicholes Rough, NP       influenza vac split quadrivalent PF (FLUARIX) injection 0.5 mL  0.5 mL Intramuscular Tomorrow-1000 Lovena Le, Cody W, PA-C       loperamide (IMODIUM) capsule 2-4 mg  2-4 mg Oral PRN Nicholes Rough, NP       loratadine (CLARITIN) tablet 10 mg  10 mg Oral Daily Revonda Humphrey, NP   10 mg at 01/23/22 0900   LORazepam (ATIVAN) tablet 1 mg  1 mg Oral TID Nicholes Rough, NP   1 mg at 01/23/22 1300   Followed by   Derrill Memo ON 01/24/2022] LORazepam (ATIVAN) tablet 1 mg  1 mg Oral BID Nicholes Rough, NP       Followed by   Derrill Memo ON 01/25/2022] LORazepam (ATIVAN) tablet 1 mg  1 mg Oral Daily Nkwenti, Doris, NP       magnesium hydroxide (MILK OF MAGNESIA) suspension 30 mL  30 mL Oral Daily PRN  Revonda Humphrey, NP       menthol-cetylpyridinium (CEPACOL) lozenge 3 mg  1 lozenge Oral PRN Janine Limbo, MD   3 mg at 01/22/22 1941   methocarbamol (ROBAXIN) tablet 500 mg  500 mg Oral Q6H PRN Nicholes Rough, NP       mirtazapine (REMERON) tablet 30 mg  30 mg Oral QHS Nicholes Rough, NP       multivitamin with minerals tablet 1 tablet  1 tablet Oral Daily Nicholes Rough, NP   1 tablet at 01/23/22 0900   OLANZapine zydis (ZYPREXA) disintegrating tablet 5 mg  5 mg Oral Q8H PRN Nicholes Rough, NP   5 mg at 01/22/22 2308   And   ziprasidone (GEODON) injection 20 mg  20 mg Intramuscular PRN Nicholes Rough, NP       ondansetron (ZOFRAN-ODT) disintegrating tablet 4 mg  4 mg Oral Q6H PRN Nicholes Rough, NP   4 mg at 01/22/22 2125   [START ON 01/24/2022] sertraline (ZOLOFT) tablet 50 mg  50 mg Oral Daily Nkwenti, Tamela Oddi, NP       thiamine tablet 100 mg  100 mg Oral Daily Nkwenti, Doris, NP   100 mg at 01/23/22 0900   traZODone (DESYREL) tablet 50 mg  50 mg Oral QHS  PRN Revonda Humphrey, NP   50 mg at 01/22/22 2307   PTA Medications: Medications Prior to Admission  Medication Sig Dispense Refill Last Dose   benztropine (COGENTIN) 1 MG tablet Take 1 tablet (1 mg total) by mouth 2 (two) times daily. (Patient not taking: Reported on 01/20/2022) 30 tablet 0    EPINEPHrine 0.3 mg/0.3 mL IJ SOAJ injection Inject 0.3 mLs (0.3 mg total) into the muscle as needed for anaphylaxis. (Patient not taking: Reported on 01/20/2022) 1 each 1    EPINEPHrine 0.3 mg/0.3 mL IJ SOAJ injection Inject 0.3 mLs (0.3 mg total) into the muscle as needed for anaphylaxis. 2 each 2    FLUoxetine (PROZAC) 40 MG capsule Take 40 mg by mouth daily.      guaiFENesin (MUCINEX) 600 MG 12 hr tablet Take 600 mg by mouth every 12 (twelve) hours.      mirtazapine (REMERON) 15 MG tablet Take 15 mg by mouth at bedtime.       Patient Stressors: Financial difficulties   Health problems   Substance abuse   Traumatic event    Patient  Strengths: Capable of independent living  General fund of knowledge  Supportive family/friends   Treatment Modalities: Medication Management, Group therapy, Case management,  1 to 1 session with clinician, Psychoeducation, Recreational therapy.   Physician Treatment Plan for Primary Diagnosis: Severe recurrent major depressive disorder with psychotic features ([place]) Long Term Goal(s): Improvement in symptoms so as ready for discharge   Short Term Goals: Ability to identify changes in lifestyle to reduce recurrence of condition will improve Ability to verbalize feelings will improve Ability to disclose and discuss suicidal ideas Ability to demonstrate self-control will improve Ability to maintain clinical measurements within normal limits will improve Compliance with prescribed medications will improve Ability to identify triggers associated with substance abuse/mental health issues will improve  Medication Management: Evaluate patient's response, side effects, and tolerance of medication regimen.  Therapeutic Interventions: 1 to 1 sessions, Unit Group sessions and Medication administration.  Evaluation of Outcomes: Not Met  Physician Treatment Plan for Secondary Diagnosis: Principal Problem:   Severe recurrent major depressive disorder with psychotic features ([place]) Active Problems:   MDD (major depressive disorder), severe ([facility])   MDD (major depressive disorder), recurrent episode, severe ([place])  Long Term Goal(s): Improvement in symptoms so as ready for discharge   Short Term Goals: Ability to identify changes in lifestyle to reduce recurrence of condition will improve Ability to verbalize feelings will improve Ability to disclose and discuss suicidal ideas Ability to demonstrate self-control will improve Ability to maintain clinical measurements within normal limits will improve Compliance with prescribed medications will improve Ability to identify triggers associated with  substance abuse/mental health issues will improve     Medication Management: Evaluate patient's response, side effects, and tolerance of medication regimen.  Therapeutic Interventions: 1 to 1 sessions, Unit Group sessions and Medication administration.  Evaluation of Outcomes: Not Met   RN Treatment Plan for Primary Diagnosis: Severe recurrent major depressive disorder with psychotic features ([place]) Long Term Goal(s): Knowledge of disease and therapeutic regimen to maintain health will improve  Short Term Goals: Ability to remain free from injury will improve, Ability to participate in decision making will improve, Ability to verbalize feelings will improve, Ability to disclose and discuss suicidal ideas, and Ability to identify and develop effective coping behaviors will improve  Medication Management: RN will administer medications as ordered by provider, will assess and evaluate patient's response and provide education to patient  for prescribed medication. RN will report any adverse and/or side effects to prescribing provider.  Therapeutic Interventions: 1 on 1 counseling sessions, Psychoeducation, Medication administration, Evaluate responses to treatment, Monitor vital signs and CBGs as ordered, Perform/monitor CIWA, COWS, AIMS and Fall Risk screenings as ordered, Perform wound care treatments as ordered.  Evaluation of Outcomes: Not Met   LCSW Treatment Plan for Primary Diagnosis: Severe recurrent major depressive disorder with psychotic features ([place]) Long Term Goal(s): Safe transition to appropriate next level of care at discharge, Engage patient in therapeutic group addressing interpersonal concerns.  Short Term Goals: Engage patient in aftercare planning with referrals and resources, Increase social support, Increase emotional regulation, Facilitate acceptance of mental health diagnosis and concerns, Identify triggers associated with mental health/substance abuse issues, and Increase  skills for wellness and recovery  Therapeutic Interventions: Assess for all discharge needs, 1 to 1 time with Social worker, Explore available resources and support systems, Assess for adequacy in community support network, Educate family and significant other(s) on suicide prevention, Complete Psychosocial Assessment, Interpersonal group therapy.  Evaluation of Outcomes: Not Met   Progress in Treatment: Attending groups: Yes. Participating in groups: Yes. Taking medication as prescribed: Yes. Toleration medication: Yes. Family/Significant other contact made: Yes, individual(s) contacted:  If patient provides consents  Patient understands diagnosis: Yes. Discussing patient identified problems/goals with staff: Yes. Medical problems stabilized or resolved: Yes. Denies suicidal/homicidal ideation: Yes. Issues/concerns per patient self-inventory: No.   New problem(s) identified: No, Describe:  None   New Short Term/Long Term Goal(s): medication stabilization, elimination of SI thoughts, development of comprehensive mental wellness plan.   Patient Goals: "To get the voices out of my head and get back on my medications"   Discharge Plan or Barriers: Patient recently admitted. CSW will continue to follow and assess for appropriate referrals and possible discharge planning.   Reason for Continuation of Hospitalization: Depression Hallucinations Medication stabilization Suicidal ideation  Estimated Length of Stay: 3 to 5 days    Scribe for Treatment Team: Darleen Crocker, Latanya Presser 01/23/2022 1:53 PM

## 2022-01-23 NOTE — Progress Notes (Signed)
Patient did not attend the evening NA meeting.

## 2022-01-23 NOTE — Progress Notes (Signed)
Pt labs rescheduled for afternoon as pt is now asleep after being wakeful most of the night.

## 2022-01-23 NOTE — Progress Notes (Addendum)
Patient has stayed in bed most of the day.  Emotional support and encouragement given  patient. Safety maintained with 15 minute checks.   After dinner patient complained of legs cramping or having spasms.  Robaxin and vistaril given patient.    Patient has SI thoughts off/on, contracts for safety, no plans.  Denied HI.  Voices sometimes, mumblings.  Sees shadows sometimes, probably three times since being at [facility].   Patient did walk to dining room for dinner tonight.

## 2022-01-23 NOTE — Progress Notes (Signed)
°   01/23/22 2129  Psych Admission Type (Psych Patients Only)  Admission Status Voluntary  Psychosocial Assessment  Patient Complaints Anxiety;Depression;Sadness  Eye Contact Brief;Fair  Facial Expression Sullen;Sad  Affect Sad;Sullen  Speech Logical/coherent;Soft  Interaction Minimal  Motor Activity Slow  Appearance/Hygiene Unremarkable  Behavior Characteristics Cooperative  Mood Sullen;Sad  Thought Process  Coherency WDL  Content Blaming self  Delusions None reported or observed  Perception Hallucinations  Hallucination Auditory;Visual  Judgment Poor  Confusion None  Danger to Self  Current suicidal ideation? Passive  Self-Injurious Behavior No self-injurious ideation or behavior indicators observed or expressed   Agreement Not to Harm Self Yes  Description of Agreement Verbal contract  Danger to Others  Danger to Others None reported or observed

## 2022-01-23 NOTE — B[facility] Counselor (Signed)
CSW attempted to complete PSA with pt but was unable to wake him. Notes from night staff reflect pt had little sleep lastnight. CSW will make another attempt at a later time.   Fredirick Lathe, LCSWA Clinicial Social Worker [employer]

## 2022-01-24 LAB — URINALYSIS, ROUTINE W REFLEX MICROSCOPIC
Bilirubin Urine: NEGATIVE
Glucose, UA: NEGATIVE mg/dL
Hgb urine dipstick: NEGATIVE
Ketones, ur: NEGATIVE mg/dL
Leukocytes,Ua: NEGATIVE
Nitrite: NEGATIVE
Protein, ur: NEGATIVE mg/dL
Specific Gravity, Urine: 1.011 (ref 1.005–1.030)
pH: 8 (ref 5.0–8.0)

## 2022-01-24 MED ORDER — OLANZAPINE 5 MG PO TABS
5.0000 mg | ORAL_TABLET | Freq: Every day | ORAL | Status: DC
  Administered 2022-01-24 – 2022-01-31 (×8): 5 mg via ORAL
  Filled 2022-01-24 (×11): qty 1

## 2022-01-24 NOTE — Group Note (Signed)
Date:  01/24/2022 Time:  9:55 AM  Group Topic/Focus:  Orientation:   The focus of this group is to educate the patient on the purpose and policies of crisis stabilization and provide a format to answer questions about their admission.  The group details unit policies and expectations of patients while admitted.    Participation Level:  Did Not Attend  Participation Quality:    Affect:    Cognitive:    Insight:   Engagement in Group:    Modes of Intervention:  Discussion  Additional Comments:  Invited to group but did not attend   Reymundo Poll 01/24/2022, 9:55 AM

## 2022-01-24 NOTE — Progress Notes (Signed)
[place] Group Notes:  (Nursing/MHT/Case Management/Adjunct)  Date:  01/24/2022  Time:  2015 Type of Therapy:   wrap up group  Participation Level:  Active  Participation Quality:  Appropriate, Attentive, Sharing, and Supportive  Affect:  Depressed  Cognitive:  Alert  Insight:  Improving  Engagement in Group:  Engaged  Modes of Intervention:  Clarification, Education, and Support  Summary of Progress/Problems: Positive thinking and positive change were discussed.   Shellia Cleverly 01/24/2022, 8:55 PM

## 2022-01-24 NOTE — Progress Notes (Signed)
[facility] MD Progress Note  01/24/2022 3:37 PM Joseph Curtis  MRN:  [id number]  Subjective:  Joseph Curtis tearfully reports, "I had a rough time last night. I did not sleep well at all. It seem like the voices in my head are getting worse. They are bunch of chattering voices, not able to make out what they are saying. I have been hearing these voices for one year, they come & go. I'm worried about my wife, she is alone & homeless out there. We both are homeless for 3 years living in our car Daily Notes: 01/24/2022: Joseph Curtis is seen, chart reviewed. The chart finding discussed with the treatment team. He presents alert, oriented & aware of situation. He is visible on the unit, attended the morning group session. Joseph Curtis presents tearful. He reports had a rough time last night, unable to sleep well due to the chattering voices in his head. He says he has been hearing voices for about one year, they come & go, only this time that they seem to be getting worse. He remembers having taken Haldol in the past for the voices. He presents depressed & hopeless due to his homeless situation. He is also worrying about his wife who currently lives in a car as both have been homeless x 3 years. Joseph Curtis continues to endorse suicidal ideations, auditory hallucinations. However, denies any HI, VH, delusional thoughts or paranoia. He denies nay plans or intent to hurt himself. He does not appear to be responding to any internal stimuli. He is in agreement to start Zyprexa 5 mg po Q hs for psychosis. He is encouraged to continue take his medications, attend group session. We will continue current plan of care as already in progress. V/S are WNL, pt denies any abnormal movements of any of his extremities, and is currently on the Ativan detox protocol. Baseline UA to be collected & send to the lab, result pending..  Principal Problem: Severe recurrent major depressive disorder with psychotic features ([place]) Diagnosis: Principal Problem:    Severe recurrent major depressive disorder with psychotic features ([place]) Active Problems:   Anxiety   Posttraumatic stress disorder   Polysubstance abuse (HCC)   MDD (major depressive disorder), recurrent episode, severe ([place])  Total Time spent with patient: 20 minutes  Past Psychiatric History: As above  Past Medical History:  Past Medical History:  Diagnosis Date   Anxiety    Arthritis    Back pain    chronic   Depression    Fall    from construction site   Headache    Hypertension    Legg-Calve-Perthes disease, bilateral    Neck pain    chronic   Panic attacks    Pneumonia     Past Surgical History:  Procedure Laterality Date   HERNIA REPAIR     JOINT REPLACEMENT     right knee, left shoulder   ORCHIECTOMY Left    Family History: History reviewed. No pertinent family history.  Family Psychiatric  History: None reported  Social History:  Social History   Substance and Sexual Activity  Alcohol Use Not Currently     Social History   Substance and Sexual Activity  Drug Use Not Currently   Types: Cocaine, Heroin, Marijuana   Comment: states stopped using drugs few months back.    Social History   Socioeconomic History   Marital status: Married    Spouse name: Not on file   Number of children: Not on file   Years of education:  Not on file   Highest education level: Not on file  Occupational History   Not on file  Tobacco Use   Smoking status: Every Day    Packs/day: 0.50    Years: 30.00    Pack years: 15.00    Types: Cigarettes   Smokeless tobacco: Never  Vaping Use   Vaping Use: Never used  Substance and Sexual Activity   Alcohol use: Not Currently   Drug use: Not Currently    Types: Cocaine, Heroin, Marijuana    Comment: states stopped using drugs few months back.   Sexual activity: Not Currently  Other Topics Concern   Not on file  Social History Narrative   Not on file   Social Determinants of Health   Financial Resource Strain:  Not on file  Food Insecurity: Not on file  Transportation Needs: Not on file  Physical Activity: Not on file  Stress: Not on file  Social Connections: Not on file   Sleep: Fair  Appetite:  Fair  Current Medications: Current Facility-Administered Medications  Medication Dose Route Frequency Provider Last Rate Last Admin   acetaminophen (TYLENOL) tablet 650 mg  650 mg Oral Q6H PRN Revonda Humphrey, NP   650 mg at 01/24/22 0005   alum & mag hydroxide-simeth (MAALOX/MYLANTA) 200-200-20 MG/5ML suspension 30 mL  30 mL Oral Q4H PRN Revonda Humphrey, NP   30 mL at 01/24/22 0114   amLODipine (NORVASC) tablet 5 mg  5 mg Oral Daily Revonda Humphrey, NP   5 mg at 01/24/22 0806   benztropine (COGENTIN) tablet 1 mg  1 mg Oral BID Revonda Humphrey, NP   1 mg at 01/24/22 0805   guaiFENesin (MUCINEX) 12 hr tablet 600 mg  600 mg Oral Q12H Revonda Humphrey, NP   600 mg at 01/24/22 0805   hydrOXYzine (ATARAX) tablet 50 mg  50 mg Oral Q6H PRN Nicholes Rough, NP   50 mg at 01/23/22 1926   loperamide (IMODIUM) capsule 2-4 mg  2-4 mg Oral PRN Nicholes Rough, NP       loratadine (CLARITIN) tablet 10 mg  10 mg Oral Daily Thomes Lolling H, NP   10 mg at 01/24/22 [id number]   LORazepam (ATIVAN) tablet 1 mg  1 mg Oral BID Nicholes Rough, NP   1 mg at 01/24/22 [id number]   Followed by   Derrill Memo ON 01/25/2022] LORazepam (ATIVAN) tablet 1 mg  1 mg Oral Daily Nkwenti, Doris, NP       magnesium hydroxide (MILK OF MAGNESIA) suspension 30 mL  30 mL Oral Daily PRN Revonda Humphrey, NP       menthol-cetylpyridinium (CEPACOL) lozenge 3 mg  1 lozenge Oral PRN Massengill, Ovid Curd, MD   3 mg at 01/24/22 [id number]   methocarbamol (ROBAXIN) tablet 500 mg  500 mg Oral Q6H PRN Nicholes Rough, NP   500 mg at 01/24/22 [id number]   mirtazapine (REMERON) tablet 30 mg  30 mg Oral QHS Nkwenti, Tamela Oddi, NP   30 mg at 01/23/22 2129   multivitamin with minerals tablet 1 tablet  1 tablet Oral Daily Nicholes Rough, NP   1 tablet at 01/24/22 0806    OLANZapine (ZYPREXA) tablet 5 mg  5 mg Oral QHS Ladasia Sircy I, NP       OLANZapine zydis (ZYPREXA) disintegrating tablet 5 mg  5 mg Oral Q8H PRN Nicholes Rough, NP   5 mg at 01/22/22 2308   And   ziprasidone (GEODON) injection 20 mg  20 mg  Intramuscular PRN Nicholes Rough, NP       ondansetron (ZOFRAN-ODT) disintegrating tablet 4 mg  4 mg Oral Q6H PRN Nicholes Rough, NP   4 mg at 01/22/22 2125   sertraline (ZOLOFT) tablet 50 mg  50 mg Oral Daily Nicholes Rough, NP   50 mg at 01/24/22 0805   thiamine tablet 100 mg  100 mg Oral Daily Nicholes Rough, NP   100 mg at 01/24/22 0806   traZODone (DESYREL) tablet 50 mg  50 mg Oral QHS PRN Revonda Humphrey, NP   50 mg at 01/23/22 2129   Lab Results:  Results for orders placed or performed during the hospital encounter of 01/21/22 (from the past 48 hour(s))  Comprehensive metabolic panel     Status: Abnormal   Collection Time: 01/23/22  6:00 PM  Result Value Ref Range   Sodium 140 135 - 145 mmol/L   Potassium 4.4 3.5 - 5.1 mmol/L   Chloride 104 98 - 111 mmol/L   CO2 28 22 - 32 mmol/L   Glucose, Bld 147 (H) 70 - 99 mg/dL    Comment: Glucose reference range applies only to samples taken after fasting for at least 8 hours.   BUN 24 (H) 6 - 20 mg/dL   Creatinine, Ser 1.28 (H) 0.61 - 1.24 mg/dL   Calcium 9.4 8.9 - 10.3 mg/dL   Total Protein 7.0 6.5 - 8.1 g/dL   Albumin 4.0 3.5 - 5.0 g/dL   AST 31 15 - 41 U/L   ALT 24 0 - 44 U/L   Alkaline Phosphatase 70 38 - 126 U/L   Total Bilirubin 0.1 (L) 0.3 - 1.2 mg/dL   GFR, Estimated >60 >60 mL/min    Comment: (NOTE) Calculated using the CKD-EPI Creatinine Equation (2021)    Anion gap 8 5 - 15    Comment: Performed at [facility], [address] [place], [place] [address]   Blood Alcohol level:  Lab Results  Component Value Date   ETH <10 01/20/2022   ETH <10 0000000   Metabolic Disorder Labs: Lab Results  Component Value Date   HGBA1C 5.1 01/20/2022   MPG 99.67  01/20/2022   No results found for: PROLACTIN Lab Results  Component Value Date   CHOL 199 01/20/2022   TRIG 70 01/20/2022   HDL 45 01/20/2022   CHOLHDL 4.4 01/20/2022   VLDL 14 01/20/2022   LDLCALC 140 (H) 01/20/2022   Physical Findings: AIMS: Facial and Oral Movements Muscles of Facial Expression: None, normal Lips and Perioral Area: None, normal Jaw: None, normal Tongue: None, normal,Extremity Movements Upper (arms, wrists, hands, fingers): None, normal Lower (legs, knees, ankles, toes): None, normal, Trunk Movements Neck, shoulders, hips: None, normal, Overall Severity Severity of abnormal movements (highest score from questions above): None, normal Incapacitation due to abnormal movements: None, normal Patient's awareness of abnormal movements (rate only patient's report): No Awareness, Dental Status Current problems with teeth and/or dentures?: No Does patient usually wear dentures?: No  CIWA:  CIWA-Ar Total: 1 COWS:     Musculoskeletal: Strength & Muscle Tone: within normal limits Gait & Station: normal Patient leans: N/A  Psychiatric Specialty Exam:  Presentation  General Appearance: Casual; Fairly Groomed  Eye Contact:Good  Speech:Clear and Coherent; Normal Rate  Speech Volume:Normal  Handedness:Right  Mood and Affect  Mood:Anxious; Depressed  Affect:Tearful  Thought Process  Thought Processes:Coherent; Goal Directed  Descriptions of Associations:Intact  Orientation:Full (Time, Place and Person)  Thought Content:Logical; Rumination  History of Schizophrenia/Schizoaffective  disorder: NA  Duration of Psychotic Symptoms: NA  Hallucinations:Hallucinations: Auditory Description of Auditory Hallucinations: "Chattering voices in my head, can't make out what they are saying". Description of Visual Hallucinations: NA  Ideas of Reference:None  Suicidal Thoughts:Suicidal Thoughts: Yes, Passive SI Active Intent and/or Plan: Without Intent; Without  Plan; Without Means to Carry Out; Without Access to Means SI Passive Intent and/or Plan: Without Intent; With Means to Carry Out; Without Means to Carry Out; Without Access to Means  Homicidal Thoughts:Homicidal Thoughts: No HI Passive Intent and/or Plan: Without Intent; Without Means to Carry Out; Without Plan; Without Access to Means  Sensorium  Memory:Immediate Good; Recent Good; Remote Good  Judgment:Fair  Insight:Fair  Executive Functions  Concentration:Fair  Attention Span:Fair  [place]  Language:Good  Psychomotor Activity  Psychomotor Activity:Psychomotor Activity: Normal AIMS Completed?: -- (NA)  Assets  Assets:Communication Skills; Desire for Improvement; Resilience; Social Support; Transportation  Sleep  Sleep:Sleep: Fair Number of Hours of Sleep: 4.5  Physical Exam: Physical Exam HENT:     Head: Normocephalic.     Nose: Nose normal. No congestion.  Eyes:     Pupils: Pupils are equal, round, and reactive to light.  Pulmonary:     Effort: Pulmonary effort is normal. No respiratory distress.  Musculoskeletal:        General: No swelling. Normal range of motion.     Cervical back: Normal range of motion. No rigidity.  Neurological:     Mental Status: He is alert and oriented to person, place, and time.     Sensory: No sensory deficit.   Review of Systems  Constitutional: Negative.  Negative for fever.  HENT: Negative.  Negative for sore throat.   Eyes: Negative.   Respiratory: Negative.  Negative for cough, shortness of breath and wheezing.   Cardiovascular: Negative.  Negative for chest pain and palpitations.  Gastrointestinal: Negative.  Negative for heartburn, nausea and vomiting.  Genitourinary: Negative.   Musculoskeletal:  Positive for back pain, joint pain and myalgias.  Skin: Negative.   Neurological: Negative.  Negative for dizziness, tingling, tremors and headaches.  Endo/Heme/Allergies:        Allergies Bee  vernom PCN Codeine Peanuts  Psychiatric/Behavioral:  Positive for depression, hallucinations, substance abuse and suicidal ideas. The patient is nervous/anxious and has insomnia.   Blood pressure 136/84, pulse 94, temperature 98.5 F (36.9 C), temperature source Oral, resp. rate 18, height 5\' 7"  (1.702 m), weight 61.7 kg, SpO2 98 %. Body mass index is 21.3 kg/m.    Physician Treatment Plan for Secondary Diagnosis:  Principal Problem:   Severe recurrent major depressive disorder with psychotic features ([place]).  The current medical regimen is effective;  continue present plan and medications.   Continue inpatient hospitalization. Will continue today 01/24/2022 plan as below except where it is noted.    Safety and Monitoring: Voluntary admission to inpatient psychiatric unit for safety, stabilization and treatment Daily contact with patient to assess and evaluate symptoms and progress in treatment Patient's case to be discussed in multi-disciplinary team meeting Observation Level : q15 minute checks Vital signs: q12 hours Precautions: suicide, elopement, and assault   Medications MDD with Psychotic Features -Initiated Zyprexa 5 mg po Q bedtime for psychosis. -Continue  Zoloft 50 mg daily (start on 01/24/22) -Continue Remeron 30 mg nightly   Anxiety/CIWA < or =10 -Continue Hydroxyzine 50 mg every 6 hours PRN   Continue CIWA & Ativan Detox Protocol (see MAR for details)   Insomnia Continue Trazodone 50  mg nightly PRN    Nutritional support -Continue Multivitamin daily -Continue thiamine 100 mg daily   Seasonal allergies -Continue Claritin 10 mg daily   Other PRNS -Continue Tylenol 650 mg every 6 hours PRN for mild pain -Continue Maalox 30 mg every 4 hrs PRN for indigestion -Continue Milk of Magnesia as needed every 6 hrs for constipation -Continue Zofran disintegrating tabs every 6 hrs PRN for nausea  -Continue Agitation Protocol (Zyprexa/Geodon PRN) see MAR -Continue  Robaxin 500 mg Q6H PRN for back/shoulder pain  Discharge Planning: Social work and case management to assist with discharge planning and identification of hospital follow-up needs prior to discharge Estimated LOS: 5-7 days   Lindell Spar, NP, pmhnp,fnp-bc. 01/24/2022, 3:37 PM Patient ID: [name], male   DOB: 01-19-1961, 61 y.o.   MRN: [id number]

## 2022-01-24 NOTE — BHH Group Notes (Signed)
Patient did not attend the relaxation group. 

## 2022-01-24 NOTE — Progress Notes (Signed)
D:  Patient stated suicidal thoughts run through his mind, like a tape recording.  Denied HI.   Does hear voices, mumbling, sounds like in a tube.  Pain in hips.  Sees things, darkness, in the corner at times.   History of asthma.  Will discuss COPD with MD.  Slept 4 hours last night. A:  Medications administered per MD orders.  Emotional support and encouragement given patient. R:  Safety maintained with 15 minute checks.

## 2022-01-24 NOTE — Plan of Care (Signed)
Nurse discussed coping skills with patient.  
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
## 2022-01-25 MED ORDER — TRAZODONE HCL 50 MG PO TABS
50.0000 mg | ORAL_TABLET | Freq: Every evening | ORAL | Status: DC | PRN
Start: 2022-01-25 — End: 2022-01-26

## 2022-01-25 MED ORDER — TRAZODONE HCL 50 MG PO TABS
50.0000 mg | ORAL_TABLET | Freq: Once | ORAL | Status: AC
  Administered 2022-01-25: 50 mg via ORAL
  Filled 2022-01-25 (×2): qty 1

## 2022-01-25 NOTE — Progress Notes (Signed)
D:  Tahji was visible on the unit this evening.  He did attend evening wrap up group.  He denied SI/HI or AVH.  He stated the voices were bothering him earlier but none this evening.  He does report has difficulty sitting in the dayroom if there is a bunch of noise, "that makes the voices worse."  He reported his anxiety and depression both 8/10 (10 the worst).  He continues to worry about his wife being alone and focusing on needing disability.  He requested trazodone for sleep and Tylenol for chronic pain.     A:  1:1 with RN for support and encouragement.  Medications given as ordered.  Q 15 minute checks maintained for safety.  Encouraged participation in group and unit activities.   R:  He is currently resting quietly in his room  His last 15 minute check stated he was sleeping.  He remains safe on the unit.  We will continue to monitor the progress towards his goals.

## 2022-01-25 NOTE — Progress Notes (Addendum)
D:  Patient denied HI.  Contracts for safety.  Does think about suicide at times.    Denied A/V hallucinations.   A:  Medications administered per MD orders.  Emotional support and encouragement given patient. R:  Safety maintained with 15 minute checks.  Wife has called several times today to talk to patient. Patient has attended some of the groups today.

## 2022-01-25 NOTE — Group Note (Signed)
Date:  01/25/2022 Time:  9:04 AM  Group Topic/Focus:  Orientation:   The focus of this group is to educate the patient on the purpose and policies of crisis stabilization and provide a format to answer questions about their admission.  The group details unit policies and expectations of patients while admitted.    Participation Level:  Did Not Attend  Participation Quality:    Affect:    Cognitive:    Insight:   Engagement in Group:    Modes of Intervention:  Discussion  Additional Comments:  Invited but did not attend.  Reymundo Poll 01/25/2022, 9:04 AM

## 2022-01-25 NOTE — BHH Group Notes (Signed)
BHH Group Notes:  (Nursing/MHT/Case Management/Adjunct)  Date:  01/25/2022  Time:  1:59 PM  Type of Therapy:  Psychoeducational Skills  Participation Level:  Minimal  Participation Quality:  Drowsy  Affect:  Flat  Cognitive:  Appropriate  Insight:  Lacking  Engagement in Group:  Lacking  Modes of Intervention:  Education  Summary of Progress/Problems: Educated patient on coping skills to  help express his feelings. Patient was able to recall skills needed to delay his urges for alcohol.  Reymundo Poll 01/25/2022, 1:59 PM

## 2022-01-25 NOTE — Plan of Care (Signed)
Nurse discussed coping skills with patient.  

## 2022-01-25 NOTE — Progress Notes (Signed)
°   01/25/22 2231  Psych Admission Type (Psych Patients Only)  Admission Status Voluntary  Psychosocial Assessment  Patient Complaints Anxiety;Depression  Eye Contact Fair  Facial Expression Flat  Affect Appropriate to circumstance  Speech Logical/coherent  Interaction Minimal  Motor Activity Slow  Appearance/Hygiene Unremarkable  Behavior Characteristics Appropriate to situation  Mood Depressed  Thought Process  Coherency WDL  Content WDL  Delusions None reported or observed  Perception Hallucinations  Hallucination Auditory;Visual  Judgment Poor  Confusion None  Danger to Self  Current suicidal ideation? Denies  Self-Injurious Behavior No self-injurious ideation or behavior indicators observed or expressed   Agreement Not to Harm Self Yes  Description of Agreement Verbal agreement to not harm himself  Danger to Others  Danger to Others None reported or observed

## 2022-01-25 NOTE — Progress Notes (Signed)
Patient's wife called, Tayvon Culley, [phone] said that her husband's memory is getting worse.   Can MD run tests for patient's memory.

## 2022-01-25 NOTE — Progress Notes (Addendum)
[facility] MD Progress Note  01/25/2022 2:38 PM Joseph Curtis  MRN:  [id number]  Subjective:  Joseph Curtis reports, "I'm tired & restless due to my thoughts. They are like a tape in my head playing, playing & playing. The voices are not as much today. My depression symptoms are not as bad either. I'm trying to work through them. The only thing bothering me now is the cough.. I heard the voices earlier, but unable to figure out what the voices are saying"  Daily Notes: 01/24/2022: Joseph Curtis is seen, chart reviewed. The chart finding discussed with the treatment team. He presents alert, oriented & aware of situation. He is visible on the unit, attending group session.s Joseph Curtis presents today with an improved affect & reporting improved symptoms. He says he slept better last night. He says he has not been hearing the voices since earlier this morning, still unable to make out what the voices said. Joseph Curtis denies any suicidal/homicidal ideations. Denies any visual hallucinations, delusional thoughts or paranoia. He denies nay plans or intent to hurt himself or anyone else. He does not appear to be responding to any internal stimuli. He is in agreement to continue Zyprexa 5 mg po Q hs for psychosis. He is encouraged to continue to take his medications, attend group sessions. We will continue current plan of care as already in progress. Joseph Curtis presents without any SOB, wheezing or rales. He denies any chest pain. His vital signs are with norm. Pt denies any abnormal movements of any of his extremities.  Principal Problem: Severe recurrent major depressive disorder with psychotic features (HCC) Diagnosis: Principal Problem:   Severe recurrent major depressive disorder with psychotic features (HCC) Active Problems:   Anxiety   Posttraumatic stress disorder   Polysubstance abuse (HCC)   MDD (major depressive disorder), recurrent episode, severe (HCC)  Total Time spent with patient: 15 minutes  Past Psychiatric History:  As above  Past Medical History:  Past Medical History:  Diagnosis Date   Anxiety    Arthritis    Back pain    chronic   Depression    Fall    from construction site   Headache    Hypertension    Legg-Calve-Perthes disease, bilateral    Neck pain    chronic   Panic attacks    Pneumonia     Past Surgical History:  Procedure Laterality Date   HERNIA REPAIR     JOINT REPLACEMENT     right knee, left shoulder   ORCHIECTOMY Left    Family History: History reviewed. No pertinent family history.  Family Psychiatric  History: None reported  Social History:  Social History   Substance and Sexual Activity  Alcohol Use Not Currently     Social History   Substance and Sexual Activity  Drug Use Not Currently   Types: Cocaine, Heroin, Marijuana   Comment: states stopped using drugs few months back.    Social History   Socioeconomic History   Marital status: Married    Spouse name: Not on file   Number of children: Not on file   Years of education: Not on file   Highest education level: Not on file  Occupational History   Not on file  Tobacco Use   Smoking status: Every Day    Packs/day: 0.50    Years: 30.00    Pack years: 15.00    Types: Cigarettes   Smokeless tobacco: Never  Vaping Use   Vaping Use: Never used  Substance and Sexual  Activity   Alcohol use: Not Currently   Drug use: Not Currently    Types: Cocaine, Heroin, Marijuana    Comment: states stopped using drugs few months back.   Sexual activity: Not Currently  Other Topics Concern   Not on file  Social History Narrative   Not on file   Social Determinants of Health   Financial Resource Strain: Not on file  Food Insecurity: Not on file  Transportation Needs: Not on file  Physical Activity: Not on file  Stress: Not on file  Social Connections: Not on file   Sleep: Good  Appetite:  Good  Current Medications: Current Facility-Administered Medications  Medication Dose Route Frequency  Provider Last Rate Last Admin   acetaminophen (TYLENOL) tablet 650 mg  650 mg Oral Q6H PRN Revonda Humphrey, NP   650 mg at 01/24/22 2235   alum & mag hydroxide-simeth (MAALOX/MYLANTA) 200-200-20 MG/5ML suspension 30 mL  30 mL Oral Q4H PRN Revonda Humphrey, NP   30 mL at 01/24/22 1608   amLODipine (NORVASC) tablet 5 mg  5 mg Oral Daily Revonda Humphrey, NP   5 mg at 01/25/22 0900   benztropine (COGENTIN) tablet 1 mg  1 mg Oral BID Revonda Humphrey, NP   1 mg at 01/25/22 0900   guaiFENesin (MUCINEX) 12 hr tablet 600 mg  600 mg Oral Q12H Revonda Humphrey, NP   600 mg at 01/25/22 0859   loratadine (CLARITIN) tablet 10 mg  10 mg Oral Daily Revonda Humphrey, NP   10 mg at 01/25/22 0900   magnesium hydroxide (MILK OF MAGNESIA) suspension 30 mL  30 mL Oral Daily PRN Revonda Humphrey, NP       menthol-cetylpyridinium (CEPACOL) lozenge 3 mg  1 lozenge Oral PRN Massengill, Ovid Curd, MD   3 mg at 01/24/22 [id number]   methocarbamol (ROBAXIN) tablet 500 mg  500 mg Oral Q6H PRN Nicholes Rough, NP   500 mg at 01/24/22 1613   mirtazapine (REMERON) tablet 30 mg  30 mg Oral QHS Nicholes Rough, NP   30 mg at 01/24/22 2107   multivitamin with minerals tablet 1 tablet  1 tablet Oral Daily Nicholes Rough, NP   1 tablet at 01/25/22 0900   OLANZapine (ZYPREXA) tablet 5 mg  5 mg Oral QHS Ilario Dhaliwal, Herbert Pun I, NP   5 mg at 01/24/22 2107   OLANZapine zydis (ZYPREXA) disintegrating tablet 5 mg  5 mg Oral Q8H PRN Nicholes Rough, NP   5 mg at 01/22/22 2308   And   ziprasidone (GEODON) injection 20 mg  20 mg Intramuscular PRN Nicholes Rough, NP       ondansetron (ZOFRAN-ODT) disintegrating tablet 4 mg  4 mg Oral Q6H PRN Nicholes Rough, NP   4 mg at 01/25/22 1342   sertraline (ZOLOFT) tablet 50 mg  50 mg Oral Daily Nkwenti, Tamela Oddi, NP   50 mg at 01/25/22 0900   thiamine tablet 100 mg  100 mg Oral Daily Nkwenti, Tamela Oddi, NP   100 mg at 01/25/22 0900   traZODone (DESYREL) tablet 50 mg  50 mg Oral QHS PRN Revonda Humphrey, NP   50  mg at 01/24/22 2107   Lab Results:  Results for orders placed or performed during the hospital encounter of 01/21/22 (from the past 48 hour(s))  Urinalysis, Routine w reflex microscopic Urine, Clean Catch     Status: Abnormal   Collection Time: 01/23/22  4:53 PM  Result Value Ref Range   Color, Urine  YELLOW YELLOW   APPearance HAZY (A) CLEAR   Specific Gravity, Urine 1.011 1.005 - 1.030   pH 8.0 5.0 - 8.0   Glucose, UA NEGATIVE NEGATIVE mg/dL   Hgb urine dipstick NEGATIVE NEGATIVE   Bilirubin Urine NEGATIVE NEGATIVE   Ketones, ur NEGATIVE NEGATIVE mg/dL   Protein, ur NEGATIVE NEGATIVE mg/dL   Nitrite NEGATIVE NEGATIVE   Leukocytes,Ua NEGATIVE NEGATIVE    Comment: Performed at [facility] [address], [place], [place] [address]  Comprehensive metabolic panel     Status: Abnormal   Collection Time: 01/23/22  6:00 PM  Result Value Ref Range   Sodium 140 135 - 145 mmol/L   Potassium 4.4 3.5 - 5.1 mmol/L   Chloride 104 98 - 111 mmol/L   CO2 28 22 - 32 mmol/L   Glucose, Bld 147 (H) 70 - 99 mg/dL    Comment: Glucose reference range applies only to samples taken after fasting for at least 8 hours.   BUN 24 (H) 6 - 20 mg/dL   Creatinine, Ser 1.28 (H) 0.61 - 1.24 mg/dL   Calcium 9.4 8.9 - 10.3 mg/dL   Total Protein 7.0 6.5 - 8.1 g/dL   Albumin 4.0 3.5 - 5.0 g/dL   AST 31 15 - 41 U/L   ALT 24 0 - 44 U/L   Alkaline Phosphatase 70 38 - 126 U/L   Total Bilirubin 0.1 (L) 0.3 - 1.2 mg/dL   GFR, Estimated >60 >60 mL/min    Comment: (NOTE) Calculated using the CKD-EPI Creatinine Equation (2021)    Anion gap 8 5 - 15    Comment: Performed at [facility], [place] [address], [place], [place] [address]   Blood Alcohol level:  Lab Results  Component Value Date   ETH <10 01/20/2022   ETH <10 0000000   Metabolic Disorder Labs: Lab Results  Component Value Date   HGBA1C 5.1 01/20/2022   MPG 99.67 01/20/2022   No results found for:  PROLACTIN Lab Results  Component Value Date   CHOL 199 01/20/2022   TRIG 70 01/20/2022   HDL 45 01/20/2022   CHOLHDL 4.4 01/20/2022   VLDL 14 01/20/2022   LDLCALC 140 (H) 01/20/2022   Physical Findings: AIMS: Facial and Oral Movements Muscles of Facial Expression: None, normal Lips and Perioral Area: None, normal Jaw: None, normal Tongue: None, normal,Extremity Movements Upper (arms, wrists, hands, fingers): None, normal Lower (legs, knees, ankles, toes): None, normal, Trunk Movements Neck, shoulders, hips: None, normal, Overall Severity Severity of abnormal movements (highest score from questions above): None, normal Incapacitation due to abnormal movements: None, normal Patient's awareness of abnormal movements (rate only patient's report): No Awareness, Dental Status Current problems with teeth and/or dentures?: No Does patient usually wear dentures?: No  CIWA:  CIWA-Ar Total: 1 COWS:     Musculoskeletal: Strength & Muscle Tone: within normal limits Gait & Station: normal Patient leans: N/A  Psychiatric Specialty Exam:  Presentation  General Appearance: Casual; Fairly Groomed  Eye Contact:Good  Speech:Clear and Coherent; Normal Rate  Speech Volume:Normal  Handedness:Right  Mood and Affect  Mood:Anxious; Depressed  Affect:Tearful  Thought Process  Thought Processes:Coherent; Goal Directed  Descriptions of Associations:Intact  Orientation:Full (Time, Place and Person)  Thought Content:Logical; Rumination  History of Schizophrenia/Schizoaffective disorder: NA  Duration of Psychotic Symptoms: NA  Hallucinations:Hallucinations: Auditory Description of Auditory Hallucinations: "Chattering voices in my head, can't make out what they are saying". Description of Visual Hallucinations: NA  Ideas of Reference:None  Suicidal Thoughts:Suicidal Thoughts: Yes, Passive SI Active Intent and/or Plan: Without Intent; Without Plan; Without Means to Carry Out;  Without Access to Means SI Passive Intent and/or Plan: Without Intent; With Means to Carry Out; Without Means to Carry Out; Without Access to Means  Homicidal Thoughts:Homicidal Thoughts: No HI Passive Intent and/or Plan: Without Intent; Without Means to Carry Out; Without Plan; Without Access to Means  Sensorium  Memory:Immediate Good; Recent Good; Remote Good  Judgment:Fair  Insight:Fair  Executive Functions  Concentration:Fair  Attention Span:Fair  Recall:Good  Fund of Knowledge:Fair  Language:Good  Psychomotor Activity  Psychomotor Activity:Psychomotor Activity: Normal AIMS Completed?: -- (NA)  Assets  Assets:Communication Skills; Desire for Improvement; Resilience; Social Support; Transportation  Sleep  Sleep:Sleep: Fair Number of Hours of Sleep: 6.5  Physical Exam: Physical Exam HENT:     Head: Normocephalic.     Nose: Nose normal. No congestion.  Eyes:     Pupils: Pupils are equal, round, and reactive to light.  Pulmonary:     Effort: Pulmonary effort is normal. No respiratory distress.  Musculoskeletal:        General: No swelling. Normal range of motion.     Cervical back: Normal range of motion. No rigidity.  Neurological:     Mental Status: He is alert and oriented to person, place, and time.     Sensory: No sensory deficit.   Review of Systems  Constitutional: Negative.  Negative for fever.  HENT: Negative.  Negative for sore throat.   Eyes: Negative.   Respiratory: Negative.  Negative for cough, shortness of breath and wheezing.   Cardiovascular: Negative.  Negative for chest pain and palpitations.  Gastrointestinal: Negative.  Negative for heartburn, nausea and vomiting.  Genitourinary: Negative.   Musculoskeletal:  Positive for back pain, joint pain and myalgias.  Skin: Negative.   Neurological: Negative.  Negative for dizziness, tingling, tremors and headaches.  Endo/Heme/Allergies:        Allergies Bee vernom PCN Codeine Peanuts   Psychiatric/Behavioral:  Positive for depression, hallucinations, substance abuse and suicidal ideas. The patient is nervous/anxious and has insomnia.   Blood pressure 133/89, pulse 83, temperature 98 F (36.7 C), temperature source Oral, resp. rate 18, height 5\' 7"  (1.702 m), weight 61.7 kg, SpO2 99 %. Body mass index is 21.3 kg/m.    Physician Treatment Plan for Secondary Diagnosis:  Principal Problem:   Severe recurrent major depressive disorder with psychotic features (HCC).  The current medical regimen is effective;  continue present plan and medications.   Continue inpatient hospitalization. Will continue today 01/25/2022 plan as below except where it is noted.    Safety and Monitoring: Voluntary admission to inpatient psychiatric unit for safety, stabilization and treatment Daily contact with patient to assess and evaluate symptoms and progress in treatment Patient's case to be discussed in multi-disciplinary team meeting Observation Level : q15 minute checks Vital signs: q12 hours Precautions: suicide, elopement, and assault   Medications MDD with Psychotic Features -Continue Zyprexa 5 mg po Q bedtime for psychosis. -Continue  Zoloft 50 mg daily. -Continue Remeron 30 mg nightly   Anxiety/CIWA < or =10 -Continue Hydroxyzine 50 mg every 6 hours PRN   Continue CIWA & Ativan Detox Protocol (see MAR for details)   Insomnia Continue Trazodone 50 mg nightly PRN    Nutritional support -Continue Multivitamin daily -Continue thiamine 100 mg daily   Seasonal allergies. -Continue Mucinex 600 mg po bid for cough. -Continue Claritin 10 mg daily   Other PRNS -Continue Tylenol 650  mg every 6 hours PRN for mild pain -Continue Maalox 30 mg every 4 hrs PRN for indigestion -Continue Milk of Magnesia as needed every 6 hrs for constipation -Continue Zofran disintegrating tabs every 6 hrs PRN for nausea  -Continue Agitation Protocol (Zyprexa/Geodon PRN) see MAR -Continue Robaxin  500 mg Q6H PRN for back/shoulder pain  Discharge Planning: Social work and case management to assist with discharge planning and identification of hospital follow-up needs prior to discharge Estimated LOS: 5-7 days   Lindell Spar, NP, pmhnp,fnp-bc. 01/25/2022, 2:38 PM Patient ID: Joseph Curtis, male   DOB: [birth year], 61 y.o.   MRN: [id number] Patient ID: Joseph Curtis, male   DOB: 1961-10-16, 61 y.o.   MRN: [id number]

## 2022-01-25 NOTE — Group Note (Signed)
[facility] LCSW Group Therapy   01/25/2022 2:27 PM    Type of Therapy and Topic:  Group Therapy:  Strengths Exploration   Participation Level: Active  Description of Group: This group allows individuals to explore their strengths, learn to use strengths in new ways to improve well-being. Strengths-based interventions involve identifying strengths, understanding how they are used, and learning new ways to apply them. Individuals will identify their strengths, and then explore their roles in different areas of life (relationships, professional life, and personal fulfillment). Individuals will think about ways in which they currently use their strengths, along with new ways they could begin using them.    Therapeutic Goals Patient will verbalize two of their strengths Patient will identify how their strengths are currently used Patient will identify two new ways to apply their strengths  Patients will create a plan to apply their strengths in their daily lives     Summary of Patient Progress:  Patient participated through half of group before leaving.  Patient discussed some physical limitations to prevent him from participated in group including not having glasses.  Patient was appropriate and respectful to other group members.        Therapeutic Modalities Cognitive Behavioral Therapy Motivational Interviewing   Breckin Zafar, LCSW, LCAS Clincal Social Worker  [facility]

## 2022-01-25 NOTE — Group Note (Signed)
Recreation Therapy Group Note   Group Topic:Team Building  Group Date: 01/25/2022 Start Time: 0930 End Time: 0955 Facilitators: Victorino Sparrow, LRT,CTRS Location: [address] Day[address]   Goal Area(s) Addresses:  Patient will effectively work with peer towards shared goal.  Patient will identify skills used to make activity successful.  Patient will identify how skills used during activity can be applied to reach post d/c goals.   Group Description: [employer]. In teams of 5-6, patients were given 12 craft pipe cleaners. Using the materials provided, patients were instructed to compete again the opposing team(s) to build the tallest free-standing structure from floor level. The activity was timed; difficulty increased by Probation officer as Pharmacist, hospital continued.  Systematically resources were removed with additional directions for example, placing one arm behind their back, working in silence, and shape stipulations.    Affect/Mood: N/A   Participation Level: Did not attend    Clinical Observations/Individualized Feedback:     Plan: Continue to engage patient in RT group sessions 2-3x/week.   Victorino Sparrow, LRT,CTRS 01/25/2022 11:47 AM

## 2022-01-26 MED ORDER — TRAZODONE HCL 50 MG PO TABS
50.0000 mg | ORAL_TABLET | Freq: Every day | ORAL | Status: DC
  Administered 2022-01-26 – 2022-01-28 (×3): 50 mg via ORAL
  Filled 2022-01-26 (×6): qty 1

## 2022-01-26 MED ORDER — IBUPROFEN 400 MG PO TABS
400.0000 mg | ORAL_TABLET | Freq: Four times a day (QID) | ORAL | Status: DC | PRN
  Administered 2022-01-26 – 2022-02-01 (×7): 400 mg via ORAL
  Filled 2022-01-26 (×7): qty 1

## 2022-01-26 MED ORDER — MIRTAZAPINE 30 MG PO TABS
30.0000 mg | ORAL_TABLET | Freq: Every day | ORAL | Status: DC
  Administered 2022-01-26 – 2022-01-31 (×6): 30 mg via ORAL
  Filled 2022-01-26 (×8): qty 1

## 2022-01-26 MED ORDER — AZITHROMYCIN 250 MG PO TABS
250.0000 mg | ORAL_TABLET | Freq: Every day | ORAL | Status: AC
  Administered 2022-01-27 – 2022-01-30 (×4): 250 mg via ORAL
  Filled 2022-01-26 (×5): qty 1

## 2022-01-26 MED ORDER — MIRTAZAPINE 15 MG PO TABS: 15.0000 mg | ORAL_TABLET | Freq: Every day | ORAL | Status: DC

## 2022-01-26 MED ORDER — GUAIFENESIN ER 600 MG PO TB12
600.0000 mg | ORAL_TABLET | Freq: Two times a day (BID) | ORAL | Status: AC
  Administered 2022-01-28 – 2022-01-29 (×4): 600 mg via ORAL
  Filled 2022-01-26 (×4): qty 1

## 2022-01-26 MED ORDER — AZITHROMYCIN 500 MG PO TABS
500.0000 mg | ORAL_TABLET | Freq: Every day | ORAL | Status: AC
  Administered 2022-01-26: 500 mg via ORAL
  Filled 2022-01-26: qty 1

## 2022-01-26 NOTE — Group Note (Signed)
LCSW Group Therapy Note  01/26/2022    10:00-11:00am   Topic:  Anger Healthy and Unhealthy Coping Skills  Participation Level:  Did Not Attend  Description of Group:   In this group, patients identified their own common triggers and typical reactions then analyzed how these reactions are possibly beneficial and possibly unhelpful.  Focus was placed on examining whether typical coping skills are healthy or unhealthy.  Therapeutic Goals: Patients will share situations that commonly incite their anger and how they typically respond Patients will identify how their coping skills work for them and/or against them Patients will explore possible alternative coping skills Patients will learn that anger itself is normal and that healthier reactions can assist with resolving conflict rather than worsening situations  Summary of Patient Progress:  The patient was invited to group, did not attend.  Therapeutic Modalities:   Cognitive Behavioral Therapy Processing  Maretta Los

## 2022-01-26 NOTE — Progress Notes (Signed)
[facility] MD Progress Note  01/26/2022 1:29 PM Joseph Curtis  MRN:  [id number]  Subjective:  Joseph Curtis reports, "I'm feeling a little better, slept well last night. But, the coughing is not getting any better. I'm coughing up greenish stuff. The voices are not as bad. I have a lot of aches & pain all over". Daily Notes: 01/24/2022: Joseph Curtis is seen, chart reviewed. The chart finding discussed with the treatment team. He presents alert, oriented & aware of situation. He is visible on the unit, attending group session. Joseph Curtis presents today with an improved affect & reporting improved symptoms. He says he slept better last night without any bad dreams. He continues to complain of productive cough. Described the sputum as greenish. He denies any shortness of breath, he denies any chest pain or tightness. He does not appear to be in any apparent distress. He is complaining of generalized aches & pain. He says the auditory hallucinations are getting better, still unable to make out what the voices said. Joseph Curtis denies any suicidal/homicidal ideations. Denies any visual hallucinations, delusional thoughts or paranoia. He denies any plans or intent to hurt himself or anyone else. He does not appear to be responding to any internal stimuli. He is in agreement to start Z-pack for productive cough & Ibuprofen for pain. He is encouraged to continue to take his medications, attend group sessions. We will continue current plan of care as already in progress. His vital signs are with norm. Pt denies any abnormal movements of any of his extremities.  Principal Problem: Severe recurrent major depressive disorder with psychotic features (HCC) Diagnosis: Principal Problem:   Severe recurrent major depressive disorder with psychotic features (HCC) Active Problems:   Anxiety   Posttraumatic stress disorder   Polysubstance abuse (HCC)   MDD (major depressive disorder), recurrent episode, severe (HCC)  Total Time spent with  patient: 15 minutes  Past Psychiatric History: As above  Past Medical History:  Past Medical History:  Diagnosis Date   Anxiety    Arthritis    Back pain    chronic   Depression    Fall    from construction site   Headache    Hypertension    Legg-Calve-Perthes disease, bilateral    Neck pain    chronic   Panic attacks    Pneumonia     Past Surgical History:  Procedure Laterality Date   HERNIA REPAIR     JOINT REPLACEMENT     right knee, left shoulder   ORCHIECTOMY Left    Family History: History reviewed. No pertinent family history.  Family Psychiatric  History: None reported  Social History:  Social History   Substance and Sexual Activity  Alcohol Use Not Currently     Social History   Substance and Sexual Activity  Drug Use Not Currently   Types: Cocaine, Heroin, Marijuana   Comment: states stopped using drugs few months back.    Social History   Socioeconomic History   Marital status: Married    Spouse name: Not on file   Number of children: Not on file   Years of education: Not on file   Highest education level: Not on file  Occupational History   Not on file  Tobacco Use   Smoking status: Every Day    Packs/day: 0.50    Years: 30.00    Pack years: 15.00    Types: Cigarettes   Smokeless tobacco: Never  Vaping Use   Vaping Use: Never used  Substance and  Sexual Activity   Alcohol use: Not Currently   Drug use: Not Currently    Types: Cocaine, Heroin, Marijuana    Comment: states stopped using drugs few months back.   Sexual activity: Not Currently  Other Topics Concern   Not on file  Social History Narrative   Not on file   Social Determinants of Health   Financial Resource Strain: Not on file  Food Insecurity: Not on file  Transportation Needs: Not on file  Physical Activity: Not on file  Stress: Not on file  Social Connections: Not on file   Sleep: Good  Appetite:  Good  Current Medications: Current Facility-Administered  Medications  Medication Dose Route Frequency Provider Last Rate Last Admin   acetaminophen (TYLENOL) tablet 650 mg  650 mg Oral Q6H PRN Revonda Humphrey, NP   650 mg at 01/26/22 1319   alum & mag hydroxide-simeth (MAALOX/MYLANTA) 200-200-20 MG/5ML suspension 30 mL  30 mL Oral Q4H PRN Revonda Humphrey, NP   30 mL at 01/24/22 1608   amLODipine (NORVASC) tablet 5 mg  5 mg Oral Daily Revonda Humphrey, NP   5 mg at 01/26/22 0811   [START ON 01/27/2022] azithromycin (ZITHROMAX) tablet 250 mg  250 mg Oral Daily Lindell Spar I, NP       benztropine (COGENTIN) tablet 1 mg  1 mg Oral BID Revonda Humphrey, NP   1 mg at 01/26/22 0811   [START ON 01/28/2022] guaiFENesin (MUCINEX) 12 hr tablet 600 mg  600 mg Oral Q12H Viraat Vanpatten I, NP       ibuprofen (ADVIL) tablet 400 mg  400 mg Oral Q6H PRN Lindell Spar I, NP       loratadine (CLARITIN) tablet 10 mg  10 mg Oral Daily Thomes Lolling H, NP   10 mg at 01/26/22 [id number]   magnesium hydroxide (MILK OF MAGNESIA) suspension 30 mL  30 mL Oral Daily PRN Revonda Humphrey, NP       menthol-cetylpyridinium (CEPACOL) lozenge 3 mg  1 lozenge Oral PRN Massengill, Ovid Curd, MD   3 mg at 01/24/22 [id number]   methocarbamol (ROBAXIN) tablet 500 mg  500 mg Oral Q6H PRN Nicholes Rough, NP   500 mg at 01/25/22 2242   mirtazapine (REMERON) tablet 30 mg  30 mg Oral QHS Lindell Spar I, NP       multivitamin with minerals tablet 1 tablet  1 tablet Oral Daily Nicholes Rough, NP   1 tablet at 01/26/22 0812   OLANZapine (ZYPREXA) tablet 5 mg  5 mg Oral QHS Rayne Loiseau, Herbert Pun I, NP   5 mg at 01/25/22 2106   OLANZapine zydis (ZYPREXA) disintegrating tablet 5 mg  5 mg Oral Q8H PRN Nicholes Rough, NP   5 mg at 01/22/22 2308   And   ziprasidone (GEODON) injection 20 mg  20 mg Intramuscular PRN Nicholes Rough, NP       ondansetron (ZOFRAN-ODT) disintegrating tablet 4 mg  4 mg Oral Q6H PRN Nicholes Rough, NP   4 mg at 01/25/22 1342   sertraline (ZOLOFT) tablet 50 mg  50 mg Oral Daily Nicholes Rough, NP   50 mg at 01/26/22 [id number]   thiamine tablet 100 mg  100 mg Oral Daily Nicholes Rough, NP   100 mg at 01/26/22 [id number]   traZODone (DESYREL) tablet 50 mg  50 mg Oral QHS Lindell Spar I, NP       Lab Results:  No results found for this or any previous visit (from  the past 48 hour(s)).  Blood Alcohol level:  Lab Results  Component Value Date   ETH <10 01/20/2022   ETH <10 0000000   Metabolic Disorder Labs: Lab Results  Component Value Date   HGBA1C 5.1 01/20/2022   MPG 99.67 01/20/2022   No results found for: PROLACTIN Lab Results  Component Value Date   CHOL 199 01/20/2022   TRIG 70 01/20/2022   HDL 45 01/20/2022   CHOLHDL 4.4 01/20/2022   VLDL 14 01/20/2022   LDLCALC 140 (H) 01/20/2022   Physical Findings: AIMS: Facial and Oral Movements Muscles of Facial Expression: None, normal Lips and Perioral Area: None, normal Jaw: None, normal Tongue: None, normal,Extremity Movements Upper (arms, wrists, hands, fingers): None, normal Lower (legs, knees, ankles, toes): None, normal, Trunk Movements Neck, shoulders, hips: None, normal, Overall Severity Severity of abnormal movements (highest score from questions above): None, normal Incapacitation due to abnormal movements: None, normal Patient's awareness of abnormal movements (rate only patient's report): No Awareness, Dental Status Current problems with teeth and/or dentures?: No Does patient usually wear dentures?: No  CIWA:  CIWA-Ar Total: 0 COWS:     Musculoskeletal: Strength & Muscle Tone: within normal limits Gait & Station: normal Patient leans: N/A  Psychiatric Specialty Exam:  Presentation  General Appearance: Casual; Fairly Groomed  Eye Contact:Good  Speech:Clear and Coherent; Normal Rate  Speech Volume:Normal  Handedness:Right  Mood and Affect  Mood:Anxious; Depressed  Affect:Tearful  Thought Process  Thought Processes:Coherent; Goal Directed  Descriptions of  Associations:Intact  Orientation:Full (Time, Place and Person)  Thought Content:Logical; Rumination  History of Schizophrenia/Schizoaffective disorder: NA  Duration of Psychotic Symptoms: NA  Hallucinations:No data recorded  Ideas of Reference:None  Suicidal Thoughts:No data recorded  Homicidal Thoughts:No data recorded  Sensorium  Memory:Immediate Good; Recent Good; Remote Good  Judgment:Fair  Insight:Fair  Executive Functions  Concentration:Fair  Attention Span:Fair  [place]  Language:Good  Psychomotor Activity  Psychomotor Activity:No data recorded  Assets  Assets:Communication Skills; Desire for Improvement; Resilience; Social Support; Transportation  Sleep  Sleep: Good.  Physical Exam: Physical Exam HENT:     Head: Normocephalic.     Nose: Nose normal. No congestion.  Eyes:     Pupils: Pupils are equal, round, and reactive to light.  Pulmonary:     Effort: Pulmonary effort is normal. No respiratory distress.  Musculoskeletal:        General: No swelling. Normal range of motion.     Cervical back: Normal range of motion. No rigidity.  Neurological:     Mental Status: He is alert and oriented to person, place, and time.     Sensory: No sensory deficit.   Review of Systems  Constitutional: Negative.  Negative for fever.  HENT: Negative.  Negative for sore throat.   Eyes: Negative.   Respiratory: Negative.  Negative for cough, shortness of breath and wheezing.   Cardiovascular: Negative.  Negative for chest pain and palpitations.  Gastrointestinal: Negative.  Negative for heartburn, nausea and vomiting.  Genitourinary: Negative.   Musculoskeletal:  Positive for back pain, joint pain and myalgias.  Skin: Negative.   Neurological: Negative.  Negative for dizziness, tingling, tremors and headaches.  Endo/Heme/Allergies:        Allergies Bee vernom PCN Codeine Peanuts  Psychiatric/Behavioral:  Positive for  depression, hallucinations, substance abuse and suicidal ideas. The patient is nervous/anxious and has insomnia.   Blood pressure 125/83, pulse 72, temperature 97.6 F (36.4 C), temperature source Oral, resp. rate 18, height 5'  7" (1.702 m), weight 61.7 kg, SpO2 99 %. Body mass index is 21.3 kg/m.    Physician Treatment Plan for Secondary Diagnosis:  Principal Problem:   Severe recurrent major depressive disorder with psychotic features ([place]).  The current medical regimen is effective;  continue present plan and medications.   Continue inpatient hospitalization. Will continue today 01/26/2022 plan as below except where it is noted.    Safety and Monitoring: Voluntary admission to inpatient psychiatric unit for safety, stabilization and treatment Daily contact with patient to assess and evaluate symptoms and progress in treatment Patient's case to be discussed in multi-disciplinary team meeting Observation Level : q15 minute checks Vital signs: q12 hours Precautions: suicide, elopement, and assault   Medications MDD with Psychotic Features -Continue Zyprexa 5 mg po Q bedtime for psychosis. -Continue  Zoloft 50 mg daily. -Continue Remeron 30 mg nightly   Anxiety/CIWA < or =10 -Continue Hydroxyzine 50 mg every 6 hours PRN   Continue CIWA & Ativan Detox Protocol (see MAR for details)   Insomnia Continue Trazodone 50 mg po  nightly.    Nutritional support -Continue Multivitamin daily -Continue thiamine 100 mg daily   Seasonal allergies/cough. -Continue Mucinex 600 mg po bid for cough x 2 more days -Continue Claritin 10 mg daily.  -Initiated Z-pack x 5 days for productive cough.   Other PRNS -Continue Tylenol 650 mg every 6 hours PRN for mild pain.  -Ibuprofen 400 mg po Q 6 hrs prn for apin/fever. -Continue Maalox 30 mg every 4 hrs PRN for indigestion -Continue Milk of Magnesia as needed every 6 hrs for constipation -Continue Zofran disintegrating tabs every 6 hrs PRN for  nausea  -Continue Agitation Protocol (Zyprexa/Geodon PRN) see MAR -Continue Robaxin 500 mg Q6H PRN for back/shoulder pain  Discharge Planning: Social work and case management to assist with discharge planning and identification of hospital follow-up needs prior to discharge Estimated LOS: 5-7 days   Lindell Spar, NP, pmhnp,fnp-bc. 01/26/2022, 1:29 PM Patient ID: Joseph Curtis, male   DOB: Nov 06, 1961, 61 y.o.   MRN: [id number] Patient ID: Joseph Curtis, male   DOB: 26-Oct-1961, 61 y.o.   MRN: [id number] Patient ID: Joseph Curtis, male   DOB: 1961/10/08, 61 y.o.   MRN: [id number]

## 2022-01-26 NOTE — Progress Notes (Signed)
D. Pt stayed back from rec therapy, reporting that he threw up twice- emesis consisting of mucous. A. Pt encouraged to drink fluids. Pt provided with po fluids, and was encouraged to rest. . Pt supported emotionally and encouraged to express concerns and ask questions.   R. Pt remains safe with 15 minute checks. Will continue POC.

## 2022-01-26 NOTE — Progress Notes (Signed)
Pt is A&OX4, calm, and cooperative. Pt admits to suicidal ideations without a plan. Pt denies homicidal ideations. Pt admits to auditory hallucinations. "I can't make out what they're saying." Pt denies visual hallucinations. Pt has h/o chronic knee and hip pain. Pt states his appetite is "ok," and he is sleeping "ok."

## 2022-01-26 NOTE — Progress Notes (Signed)
Adult Psychoeducational Group Note  Date:  01/26/2022 Time:  11:18 PM  Group Topic/Focus:  Wrap-Up Group:   The focus of this group is to help patients review their daily goal of treatment and discuss progress on daily workbooks.  Participation Level:  Active  Participation Quality:  Appropriate  Affect:  Appropriate  Cognitive:  Appropriate  Insight: Appropriate  Engagement in Group:  Engaged  Modes of Intervention:  Discussion  Additional Comments:    Barbette Hair 01/26/2022, 11:18 PM

## 2022-01-26 NOTE — Progress Notes (Signed)
Pt had some nausea/vomiting before bed that he attributes to indigestions, see VS flow sheet.   Pt given maalox and zofran per orders.   01/26/22 2247  Psych Admission Type (Psych Patients Only)  Admission Status Voluntary  Psychosocial Assessment  Patient Complaints Depression  Eye Contact Fair  Facial Expression Flat  Affect Appropriate to circumstance  Speech Logical/coherent  Interaction Assertive  Motor Activity Slow  Appearance/Hygiene Unremarkable  Behavior Characteristics Appropriate to situation  Mood Depressed  Thought Process  Coherency WDL  Content WDL  Delusions None reported or observed  Perception WDL  Hallucination Auditory  Judgment Impaired  Confusion None  Danger to Self  Current suicidal ideation? Denies  Self-Injurious Behavior No self-injurious ideation or behavior indicators observed or expressed   Agreement Not to Harm Self Yes  Description of Agreement Verbal agreement to not harm himself  Danger to Others  Danger to Others None reported or observed

## 2022-01-27 MED ORDER — HYDROXYZINE HCL 25 MG PO TABS
25.0000 mg | ORAL_TABLET | ORAL | Status: DC | PRN
  Administered 2022-01-27 – 2022-02-01 (×11): 25 mg via ORAL
  Filled 2022-01-27 (×11): qty 1

## 2022-01-27 MED ORDER — HYDROXYZINE HCL 50 MG PO TABS
50.0000 mg | ORAL_TABLET | Freq: Once | ORAL | Status: AC
  Administered 2022-01-27: 50 mg via ORAL
  Filled 2022-01-27 (×2): qty 1

## 2022-01-27 NOTE — Progress Notes (Addendum)
D. Pt presented with a flat affect/ depressed mood- reported that he slept poorly last night and was still tired. Pt observed attending group led by SW this am- left early- but did not attend earlier group due to pt not feeling well. Pt currently denies SI/HI and AVH   A. Labs and vitals monitored. Pt given and educated on medications. Pt supported emotionally and encouraged to express concerns and ask questions.   R. Pt remains safe with 15 minute checks. Will continue POC.

## 2022-01-27 NOTE — Progress Notes (Signed)
°   01/27/22 2037  Psych Admission Type (Psych Patients Only)  Admission Status Voluntary  Psychosocial Assessment  Patient Complaints Depression;Anxiety  Eye Contact Fair  Facial Expression Flat  Affect Appropriate to circumstance  Speech Logical/coherent  Interaction Assertive;Needy  Motor Activity Slow  Appearance/Hygiene Unremarkable  Behavior Characteristics Appropriate to situation  Mood Depressed;Anxious  Thought Process  Coherency WDL  Content WDL  Perception WDL  Hallucination Auditory  Judgment Impaired  Confusion None  Danger to Self  Current suicidal ideation? Denies  Self-Injurious Behavior No self-injurious ideation or behavior indicators observed or expressed   Agreement Not to Harm Self Yes  Description of Agreement Verbal agreement to not harm himself  Danger to Others  Danger to Others None reported or observed

## 2022-01-27 NOTE — [facility] Counselor (Signed)
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  Ambrose Mantle, LCSW 01/27/2022, 4:45 PM

## 2022-01-27 NOTE — [facility] Group Notes (Signed)
Adult Psychoeducational Group Not Date:  01/27/2022 Time:  [id number] Group Topic/Focus: PROGRESSIVE RELAXATION. A group where deep breathing is taught and tensing and relaxation muscle groups is used. Imagery is used as well.  Pts are asked to imagine 3 pillars that hold them up when they are not able to hold themselves up.  Participation Level:  did not attend  Paulino Rily

## 2022-01-27 NOTE — Group Note (Signed)
[facility] LCSW Group Therapy Note  01/27/2022  10:00-11:00am  Type of Therapy and Topic:  Group Therapy:  A Hero Worthy of Support  Participation Level:  Active   Description of Group:  Patients in this group were introduced to the concept that additional supports including self-support are an essential part of recovery.  Matching needs with supports to help fulfill those needs was explained.  Establishing boundaries that can gradually be increased or decreased was described, with patients giving their own examples of establishing appropriate boundaries in their lives.  A song entitled "My Own Hero" was played and a group discussion ensued in which patients stated it inspired them to help themselves in order to succeed, because other people cannot achieve their goals such as sobriety or stability for them.  A song was played called "Love Me More" which led to a discussion about being willing to believe we are worth the effort of being a self-support.   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  Several people expressed having something in common with the patient.  The patient stated current supports include grandchildren, wife, and daughters.  The patient expressed a desire and willingness to add his 2 sisters after discharge to help in recovery.  The patient's participation was sporadic.  Therapeutic Modalities:   Motivational Interviewing Activity  Lynnell Chad
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
## 2022-01-27 NOTE — Progress Notes (Signed)
Pt reports voices as well as acid reflux keeping him from sleeping tonight.  Gave prn zyprexa as ordered.  See flowsheet for all prns given.

## 2022-01-28 ENCOUNTER — Encounter (HOSPITAL_COMMUNITY): Payer: Self-pay

## 2022-01-28 LAB — RESP PANEL BY RT-PCR (FLU A&B, COVID) ARPGX2
Influenza A by PCR: NEGATIVE
Influenza B by PCR: NEGATIVE
SARS Coronavirus 2 by RT PCR: NEGATIVE
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
## 2022-01-28 NOTE — Progress Notes (Addendum)
Pt denies HI/VH but endorses SI/AH that tell him not to hurt himself and verbally agrees to approach staff if these become apparent or before harming themselves/others. Pt stated that if he were gone that his wife would have a place to live. Rates depression 9/10. Rates anxiety 9/10. Rates pain 7/10. Pt found out his wife went to the ED and is being intubated due to covid, per pt. Pt was tearful and crying. Pt wife came to visit him previously. Covid swab was done and sent by shuttle.Scheduled medications administered to pt, per MD orders. RN provided support and encouragement to pt. Q15 min safety checks implemented and continued. Pt safe on the unit. RN will continue to monitor and intervene as needed.   01/28/22 0820  Psych Admission Type (Psych Patients Only)  Admission Status Voluntary  Psychosocial Assessment  Patient Complaints Anxiety;Depression;Other (Comment);Self-harm thoughts (pain)  Eye Contact Fair  Facial Expression Sad;Flat  Affect Depressed;Sad  Speech Logical/coherent  Interaction Assertive  Motor Activity Slow;Unsteady  Appearance/Hygiene Unremarkable  Behavior Characteristics Cooperative;Appropriate to situation;Calm  Mood Depressed;Anxious  Thought Process  Coherency WDL  Content WDL  Delusions None reported or observed  Perception Hallucinations  Hallucination Auditory  Judgment Impaired  Confusion None  Danger to Self  Current suicidal ideation? Passive  Self-Injurious Behavior No self-injurious ideation or behavior indicators observed or expressed   Agreement Not to Harm Self Yes  Description of Agreement Verbal agreement to not harm himself  Danger to Others  Danger to Others None reported or observed

## 2022-01-28 NOTE — BHH Counselor (Signed)
CSW met with patient regarding discharge.  Patient discussed no place to go upon discharge.  Patient will need homeless packet and other resources to work on a place to go prior to discharge.  CSW will follow up with appropriate resources.    Melena Hayes, LCSW, [place] Social Worker  [facility]

## 2022-01-28 NOTE — Group Note (Signed)
Recreation Therapy Group Note   Group Topic:Stress Management  Group Date: 01/28/2022 Start Time: 0945 End Time: 1000 Facilitators: Caroll Rancher, [place] Location: [address] Dayroom   Goal Area(s) Addresses:  Patient will identify positive stress management techniques. Patient will identify benefits of using stress management post d/c.  Group Description:  Meditation.  LRT played a meditation that focused on using positive affirmations.  Patients were to listen, follow and repeat the affirmations to themselves as the meditation played.  At completion of meditation, LRT discussed with patients were they could find other stress management techniques like Youtube and Apps.    Affect/Mood: N/A   Participation Level: Did not attend    Clinical Observations/Individualized Feedback:     Plan: Continue to engage patient in RT group sessions 2-3x/week.   Caroll Rancher, Antonietta Jewel 01/28/2022 12:59 PM

## 2022-01-28 NOTE — Progress Notes (Signed)
°   01/28/22 2258  Psych Admission Type (Psych Patients Only)  Admission Status Voluntary  Psychosocial Assessment  Patient Complaints Anxiety;Depression;Hopelessness  Eye Contact Fair  Facial Expression Sad;Flat  Affect Depressed  Speech Logical/coherent  Interaction Assertive  Motor Activity Slow  Appearance/Hygiene Unremarkable  Behavior Characteristics Cooperative  Mood Depressed  Thought Process  Coherency WDL  Content WDL  Delusions None reported or observed  Perception WDL  Hallucination None reported or observed  Judgment Impaired  Confusion None  Danger to Self  Current suicidal ideation? Passive  Description of Suicide Plan no plan  Self-Injurious Behavior No self-injurious ideation or behavior indicators observed or expressed   Agreement Not to Harm Self Yes  Description of Agreement Verbal agreement to not harm himself  Danger to Others  Danger to Others None reported or observed   D: Patient in dayroom reports his day was stressful because he was unable to talk to his wife and is unsure of her status while in the hospital.  A: Medications administered as prescribed. Support and encouragement provided as needed.  R: Patient remains safe on the unit. Will continue to monitor for safety and stability.

## 2022-01-28 NOTE — BHH Group Notes (Signed)
Spiritual care group on grief and loss facilitated by chaplain Dyanne Carrel, [facility]   Group Goal:   Support / Education around grief and loss   Members engage in facilitated group support and psycho-social education.   Group Description:   Following introductions and group rules, group members engaged in facilitated group dialog and support around topic of loss, with particular support around experiences of loss in their lives. Group Identified types of loss (relationships / self / things) and identified patterns, circumstances, and changes that precipitate losses. Reflected on thoughts / feelings around loss, normalized grief responses, and recognized variety in grief experience. Group noted Worden's four tasks of grief in discussion.   Group drew on Adlerian / Rogerian, narrative, MI,   Patient Progress: Joseph Curtis attended group and actively participated and engaged in the conversation. His comments were on topic and insightful.

## 2022-01-28 NOTE — Group Note (Signed)
[facility] LCSW Group Therapy Note   Group Date: 01/28/2022 Start Time: 1300 End Time: 1400   Type of Therapy/Topic:  Group Therapy:  Emotion Regulation  Participation Level:  Active    Description of Group:    The purpose of this group is to assist patients in learning to regulate negative emotions and experience positive emotions. Patients will be guided to discuss ways in which they have been vulnerable to their negative emotions. These vulnerabilities will be juxtaposed with experiences of positive emotions or situations, and patients challenged to use positive emotions to combat negative ones. Special emphasis will be placed on coping with negative emotions in conflict situations, and patients will process healthy conflict resolution skills.  Therapeutic Goals: Patient will identify two positive emotions or experiences to reflect on in order to balance out negative emotions:  Patient will label two or more emotions that they find the most difficult to experience:  Patient will be able to demonstrate positive conflict resolution skills through discussion or role plays:   Summary of Patient Progress:  Pt shared during introductions his name and that today they are feeling sick. Pt was appropriate and attentive during group discussion.    Therapeutic Modalities:   Cognitive Behavioral Therapy Feelings Identification Dialectical Behavioral Therapy   Felizardo Hoffmann, LCSWA

## 2022-01-28 NOTE — Progress Notes (Signed)
The patient attended the A.A.meeting and was appropriate.  

## 2022-01-28 NOTE — BH IP Treatment Plan (Signed)
Interdisciplinary Treatment and Diagnostic Plan Update  01/28/2022 Time of Session: 9:05am Joseph Curtis MRN: [id number]  Principal Diagnosis: Severe recurrent major depressive disorder with psychotic features [facility])  Secondary Diagnoses: Principal Problem:   Severe recurrent major depressive disorder with psychotic features (HCC) Active Problems:   Anxiety   Posttraumatic stress disorder   Polysubstance abuse (HCC)   MDD (major depressive disorder), recurrent episode, severe (HCC)   Current Medications:  Current Facility-Administered Medications  Medication Dose Route Frequency Provider Last Rate Last Admin   acetaminophen (TYLENOL) tablet 650 mg  650 mg Oral Q6H PRN Ardis Hughs, NP   650 mg at 01/26/22 1319   alum & mag hydroxide-simeth (MAALOX/MYLANTA) 200-200-20 MG/5ML suspension 30 mL  30 mL Oral Q4H PRN Ardis Hughs, NP   30 mL at 01/27/22 2140   amLODipine (NORVASC) tablet 5 mg  5 mg Oral Daily Ardis Hughs, NP   5 mg at 01/28/22 0819   azithromycin (ZITHROMAX) tablet 250 mg  250 mg Oral Daily Armandina Stammer I, NP   250 mg at 01/28/22 0818   benztropine (COGENTIN) tablet 1 mg  1 mg Oral BID Ardis Hughs, NP   1 mg at 01/28/22 0818   guaiFENesin (MUCINEX) 12 hr tablet 600 mg  600 mg Oral Q12H Nwoko, Nicole Kindred I, NP   600 mg at 01/28/22 0819   hydrOXYzine (ATARAX) tablet 25 mg  25 mg Oral Q4H PRN Armandina Stammer I, NP   25 mg at 01/28/22 0820   ibuprofen (ADVIL) tablet 400 mg  400 mg Oral Q6H PRN Armandina Stammer I, NP   400 mg at 01/28/22 0820   loratadine (CLARITIN) tablet 10 mg  10 mg Oral Daily Ardis Hughs, NP   10 mg at 01/28/22 0818   magnesium hydroxide (MILK OF MAGNESIA) suspension 30 mL  30 mL Oral Daily PRN Ardis Hughs, NP       menthol-cetylpyridinium (CEPACOL) lozenge 3 mg  1 lozenge Oral PRN Massengill, Harrold Donath, MD   3 mg at 01/24/22 2800   methocarbamol (ROBAXIN) tablet 500 mg  500 mg Oral Q6H PRN Starleen Blue, NP   500 mg at 01/26/22 2105    mirtazapine (REMERON) tablet 30 mg  30 mg Oral QHS Armandina Stammer I, NP   30 mg at 01/27/22 2104   multivitamin with minerals tablet 1 tablet  1 tablet Oral Daily Starleen Blue, NP   1 tablet at 01/28/22 0818   OLANZapine (ZYPREXA) tablet 5 mg  5 mg Oral QHS Nwoko, Nicole Kindred I, NP   5 mg at 01/27/22 2104   OLANZapine zydis (ZYPREXA) disintegrating tablet 5 mg  5 mg Oral Q8H PRN Starleen Blue, NP   5 mg at 01/27/22 0130   And   ziprasidone (GEODON) injection 20 mg  20 mg Intramuscular PRN Starleen Blue, NP       ondansetron (ZOFRAN-ODT) disintegrating tablet 4 mg  4 mg Oral Q6H PRN Starleen Blue, NP   4 mg at 01/27/22 2138   sertraline (ZOLOFT) tablet 50 mg  50 mg Oral Daily Starleen Blue, NP   50 mg at 01/28/22 3491   thiamine tablet 100 mg  100 mg Oral Daily Starleen Blue, NP   100 mg at 01/28/22 0818   traZODone (DESYREL) tablet 50 mg  50 mg Oral QHS Armandina Stammer I, NP   50 mg at 01/27/22 2104   PTA Medications: Medications Prior to Admission  Medication Sig Dispense Refill Last Dose   benztropine (  COGENTIN) 1 MG tablet Take 1 tablet (1 mg total) by mouth 2 (two) times daily. (Patient not taking: Reported on 01/20/2022) 30 tablet 0    EPINEPHrine 0.3 mg/0.3 mL IJ SOAJ injection Inject 0.3 mLs (0.3 mg total) into the muscle as needed for anaphylaxis. (Patient not taking: Reported on 01/20/2022) 1 each 1    EPINEPHrine 0.3 mg/0.3 mL IJ SOAJ injection Inject 0.3 mLs (0.3 mg total) into the muscle as needed for anaphylaxis. 2 each 2    FLUoxetine (PROZAC) 40 MG capsule Take 40 mg by mouth daily.      guaiFENesin (MUCINEX) 600 MG 12 hr tablet Take 600 mg by mouth every 12 (twelve) hours.      mirtazapine (REMERON) 15 MG tablet Take 15 mg by mouth at bedtime.       Patient Stressors: Financial difficulties   Health problems   Substance abuse   Traumatic event    Patient Strengths: Capable of independent living  General fund of knowledge  Supportive family/friends   Treatment Modalities:  Medication Management, Group therapy, Case management,  1 to 1 session with clinician, Psychoeducation, Recreational therapy.   Physician Treatment Plan for Primary Diagnosis: Severe recurrent major depressive disorder with psychotic features (HCC) Long Term Goal(s): Improvement in symptoms so as ready for discharge   Short Term Goals: Ability to identify changes in lifestyle to reduce recurrence of condition will improve Ability to verbalize feelings will improve Ability to disclose and discuss suicidal ideas Ability to demonstrate self-control will improve Ability to maintain clinical measurements within normal limits will improve Compliance with prescribed medications will improve Ability to identify triggers associated with substance abuse/mental health issues will improve  Medication Management: Evaluate patient's response, side effects, and tolerance of medication regimen.  Therapeutic Interventions: 1 to 1 sessions, Unit Group sessions and Medication administration.  Evaluation of Outcomes: Progressing  Physician Treatment Plan for Secondary Diagnosis: Principal Problem:   Severe recurrent major depressive disorder with psychotic features (HCC) Active Problems:   Anxiety   Posttraumatic stress disorder   Polysubstance abuse (HCC)   MDD (major depressive disorder), recurrent episode, severe (HCC)  Long Term Goal(s): Improvement in symptoms so as ready for discharge   Short Term Goals: Ability to identify changes in lifestyle to reduce recurrence of condition will improve Ability to verbalize feelings will improve Ability to disclose and discuss suicidal ideas Ability to demonstrate self-control will improve Ability to maintain clinical measurements within normal limits will improve Compliance with prescribed medications will improve Ability to identify triggers associated with substance abuse/mental health issues will improve     Medication Management: Evaluate patient's  response, side effects, and tolerance of medication regimen.  Therapeutic Interventions: 1 to 1 sessions, Unit Group sessions and Medication administration.  Evaluation of Outcomes: Progressing   RN Treatment Plan for Primary Diagnosis: Severe recurrent major depressive disorder with psychotic features (HCC) Long Term Goal(s): Knowledge of disease and therapeutic regimen to maintain health will improve  Short Term Goals: Ability to remain free from injury will improve, Ability to verbalize frustration and anger appropriately will improve, Ability to demonstrate self-control, Ability to participate in decision making will improve, and Compliance with prescribed medications will improve  Medication Management: RN will administer medications as ordered by provider, will assess and evaluate patient's response and provide education to patient for prescribed medication. RN will report any adverse and/or side effects to prescribing provider.  Therapeutic Interventions: 1 on 1 counseling sessions, Psychoeducation, Medication administration, Evaluate responses to treatment, Monitor vital  signs and CBGs as ordered, Perform/monitor CIWA, COWS, AIMS and Fall Risk screenings as ordered, Perform wound care treatments as ordered.  Evaluation of Outcomes: Progressing   LCSW Treatment Plan for Primary Diagnosis: Severe recurrent major depressive disorder with psychotic features (HCC) Long Term Goal(s): Safe transition to appropriate next level of care at discharge, Engage patient in therapeutic group addressing interpersonal concerns.  Short Term Goals: Engage patient in aftercare planning with referrals and resources, Increase social support, Increase ability to appropriately verbalize feelings, Identify triggers associated with mental health/substance abuse issues, and Increase skills for wellness and recovery  Therapeutic Interventions: Assess for all discharge needs, 1 to 1 time with Social worker,  Explore available resources and support systems, Assess for adequacy in community support network, Educate family and significant other(s) on suicide prevention, Complete Psychosocial Assessment, Interpersonal group therapy.  Evaluation of Outcomes: Progressing   Progress in Treatment: Attending groups: Yes. Participating in groups: Yes. Taking medication as prescribed: Yes. Toleration medication: Yes. Family/Significant other contact made: Yes, individual(s) contacted: wife Patient understands diagnosis: Yes. Discussing patient identified problems/goals with staff: Yes. Medical problems stabilized or resolved: Yes. Denies suicidal/homicidal ideation: Yes. Issues/concerns per patient self-inventory: No.     New problem(s) identified: No, Describe:  None    New Short Term/Long Term Goal(s): medication stabilization, elimination of SI thoughts, development of comprehensive mental wellness plan.    Patient Goals: "To get the voices out of my head and get back on my medications"    Discharge Plan or Barriers: Patient is to continue medication management with Dr. Milagros Evener   Reason for Continuation of Hospitalization: Depression Medication stabilization    Estimated Length of Stay: 1-3 days   Scribe for Treatment Team: Otelia Santee, LCSW 01/28/2022 10:19 AM

## 2022-01-28 NOTE — [facility] Counselor (Signed)
Pt was given a folder of homeless resources that included shelters, boarding houses, low-cost housing, food pantries, GoodRx card, and crisis resources.    Glendel Jaggers, LCSWA Clinicial Social Worker [facility]  

## 2022-01-29 DIAGNOSIS — F333 Major depressive disorder, recurrent, severe with psychotic symptoms: Secondary | ICD-10-CM | POA: Diagnosis not present

## 2022-01-29 DIAGNOSIS — Z23 Encounter for immunization: Secondary | ICD-10-CM | POA: Diagnosis not present

## 2022-01-29 DIAGNOSIS — Z20822 Contact with and (suspected) exposure to covid-19: Secondary | ICD-10-CM | POA: Diagnosis not present

## 2022-01-29 DIAGNOSIS — E871 Hypo-osmolality and hyponatremia: Secondary | ICD-10-CM | POA: Diagnosis not present

## 2022-01-29 LAB — BASIC METABOLIC PANEL
Anion gap: 8 (ref 5–15)
BUN: 31 mg/dL — ABNORMAL HIGH (ref 6–20)
CO2: 24 mmol/L (ref 22–32)
Calcium: 9.1 mg/dL (ref 8.9–10.3)
Chloride: 102 mmol/L (ref 98–111)
Creatinine, Ser: 1.04 mg/dL (ref 0.61–1.24)
GFR, Estimated: >60 mL/min (ref >60)
Glucose, Bld: 85 mg/dL (ref 70–99)
Potassium: 4.7 mmol/L (ref 3.5–5.1)
Sodium: 134 mmol/L — ABNORMAL LOW (ref 135–145)

## 2022-01-29 MED ORDER — TRAZODONE HCL 100 MG PO TABS
100.0000 mg | ORAL_TABLET | Freq: Every evening | ORAL | Status: DC | PRN
  Administered 2022-01-29 – 2022-01-31 (×3): 100 mg via ORAL
  Filled 2022-01-29 (×3): qty 1

## 2022-01-29 MED ORDER — POLYETHYLENE GLYCOL 3350 17 G PO PACK
17.0000 g | PACK | Freq: Every day | ORAL | Status: DC
  Administered 2022-01-29 – 2022-02-01 (×3): 17 g via ORAL
  Filled 2022-01-29 (×6): qty 1

## 2022-01-29 MED ORDER — BENZTROPINE MESYLATE 0.5 MG PO TABS
0.5000 mg | ORAL_TABLET | Freq: Two times a day (BID) | ORAL | Status: DC
  Administered 2022-01-29 – 2022-02-01 (×6): 0.5 mg via ORAL
  Filled 2022-01-29 (×10): qty 1

## 2022-01-29 MED ORDER — DOCUSATE SODIUM 100 MG PO CAPS
100.0000 mg | ORAL_CAPSULE | Freq: Every day | ORAL | Status: DC
  Administered 2022-01-29 – 2022-02-01 (×4): 100 mg via ORAL
  Filled 2022-01-29 (×6): qty 1

## 2022-01-29 NOTE — [facility] Group Notes (Signed)
Adult Psychoeducational Group Note  Date:  01/29/2022 Time:  8:44 PM  Group Topic/Focus:  Wrap-Up Group:   The focus of this group is to help patients review their daily goal of treatment and discuss progress on daily workbooks.  Participation Level:  Active  Participation Quality:  Attentive  Affect:  Appropriate  Cognitive:  Alert  Insight: Appropriate  Engagement in Group:  Engaged  Modes of Intervention:  Discussion  Additional Comments:  Patient attended and participated in the Wrap-up group.  Jearl Klinefelter 01/29/2022, 8:44 PM

## 2022-01-29 NOTE — Progress Notes (Signed)
[facility] MD Progress Note  01/29/2022 3:46 PM Joseph Curtis  MRN:  [id number]  Subjective:  Joseph Curtis reports, "I did not sleep well last night."  Daily Notes: 01/29/2022: Joseph Curtis is seen, chart reviewed. The chart finding discussed with the treatment team.  For this encounter, pt is seen in his room with the attending psychiatrist. Pt with flat affect and depressed mood, attention to personal hygiene and grooming is fair, eye contact is good, speech is clear & coherent. Thought contents are organized and logical. Pt denies SI/HI, and reports +AH of voices at 3 am earlier in the morning when he got out of bed to use the bathroom. He states that the voices were mumbling.  Pt does not appear to be responding to any internal stimuli, and also reports +VH of various colors. There is no evidence of delusional thoughts, and pt denies paranoia.    Pt reports that his sleep quality last night was poor.  There is no solid & safe discharge plan in place right now for pt.. Attending Physician talked to pt at length about the need to consider going to a substance use rehab center given that his UDS was positive for cocaine/Benzos/marijuana, but pt is currently in denial regarding his addiction, and states that the Benzos were prescribed and that his marijuana was laced with cocaine. Will continue to coordinate with pt's assigned social worker to work on a safe discharge disposition. Pt complained about being constipated. Colace has been ordered daily, Milk of Magnesia has been given. Miralax daily has been ordered. Will decrease Cogentin to 0.5 mg BID.  BUN is slightly elevated at 24, Creatinine is also slightly elevated at 1.28. LDL is 140. Bilirubin is 0.1. Hydration is being encouraged. Pt has been educated on the need for Primary care physician follow up after discharge and verbalizes understanding. V/S currently WNL, and states that pain is controlled with Robaxin and Tylenol/Motrin PRN.  HPI: Joseph Curtis is a  61 yo male with a history of anxiety, MDD & polysubstance abuse who presented to the [facility] after his wife found him walking to a tree where he had made a noose with the intent to hang himself. Pt was deemed a danger to himself and transferred to this [facility] for treatment and stabilization of his mood.  Principal Problem: Severe recurrent major depressive disorder with psychotic features ([place]) Diagnosis: Principal Problem:   Severe recurrent major depressive disorder with psychotic features ([place]) Active Problems:   Anxiety   Posttraumatic stress disorder   Polysubstance abuse (HCC)   MDD (major depressive disorder), recurrent episode, severe ([place])  Total Time spent with patient: 15 minutes  Past Psychiatric History: As above  Past Medical History:  Past Medical History:  Diagnosis Date   Anxiety    Arthritis    Back pain    chronic   Depression    Fall    from construction site   Headache    Hypertension    Legg-Calve-Perthes disease, bilateral    Neck pain    chronic   Panic attacks    Pneumonia     Past Surgical History:  Procedure Laterality Date   HERNIA REPAIR     JOINT REPLACEMENT     right knee, left shoulder   ORCHIECTOMY Left    Family History: History reviewed. No pertinent family history.  Family Psychiatric  History: None reported  Social History:  Social History   Substance and Sexual Activity  Alcohol Use Not Currently  Social History   Substance and Sexual Activity  Drug Use Not Currently   Types: Cocaine, Heroin, Marijuana   Comment: states stopped using drugs few months back.    Social History   Socioeconomic History   Marital status: Married    Spouse name: Not on file   Number of children: Not on file   Years of education: Not on file   Highest education level: Not on file  Occupational History   Not on file  Tobacco Use   Smoking status: Every Day    Packs/day: 0.50    Years: 30.00    Pack years: 15.00    Types: Cigarettes    Smokeless tobacco: Never  Vaping Use   Vaping Use: Never used  Substance and Sexual Activity   Alcohol use: Not Currently   Drug use: Not Currently    Types: Cocaine, Heroin, Marijuana    Comment: states stopped using drugs few months back.   Sexual activity: Not Currently  Other Topics Concern   Not on file  Social History Narrative   Not on file   Social Determinants of Health   Financial Resource Strain: Not on file  Food Insecurity: Not on file  Transportation Needs: Not on file  Physical Activity: Not on file  Stress: Not on file  Social Connections: Not on file   Sleep: Good  Appetite:  Good  Current Medications: Current Facility-Administered Medications  Medication Dose Route Frequency Provider Last Rate Last Admin   acetaminophen (TYLENOL) tablet 650 mg  650 mg Oral Q6H PRN Revonda Humphrey, NP   650 mg at 01/26/22 1319   alum & mag hydroxide-simeth (MAALOX/MYLANTA) 200-200-20 MG/5ML suspension 30 mL  30 mL Oral Q4H PRN Revonda Humphrey, NP   30 mL at 01/27/22 2140   amLODipine (NORVASC) tablet 5 mg  5 mg Oral Daily Revonda Humphrey, NP   5 mg at 01/29/22 [id number]   azithromycin (ZITHROMAX) tablet 250 mg  250 mg Oral Daily Lindell Spar I, NP   250 mg at 01/29/22 0745   benztropine (COGENTIN) tablet 0.5 mg  0.5 mg Oral BID Nicholes Rough, NP       docusate sodium (COLACE) capsule 100 mg  100 mg Oral Daily Nelda Marseille, Amy E, MD   100 mg at 01/29/22 1100   guaiFENesin (MUCINEX) 12 hr tablet 600 mg  600 mg Oral Q12H Nwoko, Herbert Pun I, NP   600 mg at 01/29/22 0732   hydrOXYzine (ATARAX) tablet 25 mg  25 mg Oral Q4H PRN Lindell Spar I, NP   25 mg at 01/29/22 0737   ibuprofen (ADVIL) tablet 400 mg  400 mg Oral Q6H PRN Lindell Spar I, NP   400 mg at 01/28/22 2306   loratadine (CLARITIN) tablet 10 mg  10 mg Oral Daily Revonda Humphrey, NP   10 mg at 01/29/22 0733   magnesium hydroxide (MILK OF MAGNESIA) suspension 30 mL  30 mL Oral Daily PRN Revonda Humphrey, NP   30 mL at  01/29/22 0736   menthol-cetylpyridinium (CEPACOL) lozenge 3 mg  1 lozenge Oral PRN Janine Limbo, MD   3 mg at 01/24/22 0953   methocarbamol (ROBAXIN) tablet 500 mg  500 mg Oral Q6H PRN Nicholes Rough, NP   500 mg at 01/29/22 0737   mirtazapine (REMERON) tablet 30 mg  30 mg Oral QHS Lindell Spar I, NP   30 mg at 01/28/22 2128   multivitamin with minerals tablet 1 tablet  1 tablet Oral  Daily Nicholes Rough, NP   1 tablet at 01/29/22 0732   OLANZapine (ZYPREXA) tablet 5 mg  5 mg Oral QHS Nwoko, Herbert Pun I, NP   5 mg at 01/28/22 2129   OLANZapine zydis (ZYPREXA) disintegrating tablet 5 mg  5 mg Oral Q8H PRN Nicholes Rough, NP   5 mg at 01/27/22 0130   And   ziprasidone (GEODON) injection 20 mg  20 mg Intramuscular PRN Nicholes Rough, NP       ondansetron (ZOFRAN-ODT) disintegrating tablet 4 mg  4 mg Oral Q6H PRN Nicholes Rough, NP   4 mg at 01/27/22 2138   polyethylene glycol (MIRALAX / GLYCOLAX) packet 17 g  17 g Oral Daily Faryn Sieg, NP       sertraline (ZOLOFT) tablet 50 mg  50 mg Oral Daily Jaxie Racanelli, NP   50 mg at 01/29/22 0732   thiamine tablet 100 mg  100 mg Oral Daily Nicholes Rough, NP   100 mg at 01/29/22 0732   traZODone (DESYREL) tablet 100 mg  100 mg Oral QHS PRN Nicholes Rough, NP       Lab Results:  Results for orders placed or performed during the hospital encounter of 01/21/22 (from the past 48 hour(s))  Resp Panel by RT-PCR (Flu A&B, Covid) Nasopharyngeal Swab     Status: None   Collection Time: 01/28/22  4:23 PM   Specimen: Nasopharyngeal Swab; Nasopharyngeal(NP) swabs in vial transport medium  Result Value Ref Range   SARS Coronavirus 2 by RT PCR NEGATIVE NEGATIVE    Comment: (NOTE) SARS-CoV-2 target nucleic acids are NOT DETECTED.  The SARS-CoV-2 RNA is generally detectable in upper respiratory specimens during the acute phase of infection. The lowest concentration of SARS-CoV-2 viral copies this assay can detect is 138 copies/mL. A negative result does not  preclude SARS-Cov-2 infection and should not be used as the sole basis for treatment or other patient management decisions. A negative result may occur with  improper specimen collection/handling, submission of specimen other than nasopharyngeal swab, presence of viral mutation(s) within the areas targeted by this assay, and inadequate number of viral copies(<138 copies/mL). A negative result must be combined with clinical observations, patient history, and epidemiological information. The expected result is Negative.  Fact Sheet for Patients:  EntrepreneurPulse.com.au  Fact Sheet for Healthcare Providers:  IncredibleEmployment.be  This test is no t yet approved or cleared by the [place] FDA and  has been authorized for detection and/or diagnosis of SARS-CoV-2 by FDA under an Emergency Use Authorization (EUA). This EUA will remain  in effect (meaning this test can be used) for the duration of the COVID-19 declaration under Section 564(b)(1) of the Act, 21 U.S.C.section 360bbb-3(b)(1), unless the authorization is terminated  or revoked sooner.       Influenza A by PCR NEGATIVE NEGATIVE   Influenza B by PCR NEGATIVE NEGATIVE    Comment: (NOTE) The Xpert Xpress SARS-CoV-2/FLU/RSV plus assay is intended as an aid in the diagnosis of influenza from Nasopharyngeal swab specimens and should not be used as a sole basis for treatment. Nasal washings and aspirates are unacceptable for Xpert Xpress SARS-CoV-2/FLU/RSV testing.  Fact Sheet for Patients: EntrepreneurPulse.com.au  Fact Sheet for Healthcare Providers: IncredibleEmployment.be  This test is not yet approved or cleared by the [place] FDA and has been authorized for detection and/or diagnosis of SARS-CoV-2 by FDA under an Emergency Use Authorization (EUA). This EUA will remain in effect (meaning this test can be used) for the duration of  the COVID-19 declaration under Section 564(b)(1) of the Act, 21 U.S.C. section 360bbb-3(b)(1), unless the authorization is terminated or revoked.  Performed at [facility], [place] [address] [place], [place] [address]     Blood Alcohol level:  Lab Results  Component Value Date   ETH <10 01/20/2022   ETH <10 0000000   Metabolic Disorder Labs: Lab Results  Component Value Date   HGBA1C 5.1 01/20/2022   MPG 99.67 01/20/2022   No results found for: PROLACTIN Lab Results  Component Value Date   CHOL 199 01/20/2022   TRIG 70 01/20/2022   HDL 45 01/20/2022   CHOLHDL 4.4 01/20/2022   VLDL 14 01/20/2022   LDLCALC 140 (H) 01/20/2022   Physical Findings: AIMS: Facial and Oral Movements Muscles of Facial Expression: None, normal Lips and Perioral Area: None, normal Jaw: None, normal Tongue: None, normal,Extremity Movements Upper (arms, wrists, hands, fingers): None, normal Lower (legs, knees, ankles, toes): None, normal, Trunk Movements Neck, shoulders, hips: None, normal, Overall Severity Severity of abnormal movements (highest score from questions above): None, normal Incapacitation due to abnormal movements: None, normal Patient's awareness of abnormal movements (rate only patient's report): No Awareness, Dental Status Current problems with teeth and/or dentures?: No Does patient usually wear dentures?: No  CIWA:  CIWA-Ar Total: 6 COWS:  N/A AIMS: 0  Musculoskeletal: Strength & Muscle Tone: within normal limits Gait & Station: normal Patient leans: N/A  Psychiatric Specialty Exam:  Presentation  General Appearance: Casual; Fairly Groomed  Eye Contact:Fair  Speech:Clear and Coherent  Speech Volume:Normal  Handedness:Right  Mood and Affect  Mood:Depressed  Affect:Congruent  Thought Process  Thought Processes:Coherent  Descriptions of Associations:Intact  Orientation:Full (Time, Place and Person)  Thought  Content:Logical  History of Schizophrenia/Schizoaffective disorder: NA  Duration of Psychotic Symptoms: NA  Hallucinations:Hallucinations: Auditory; Visual Description of Auditory Hallucinations: +VH of voices commanding in nature to kill self, & other voices stating that he should not Description of Visual Hallucinations: +VH of shadows  Ideas of Reference:None  Suicidal Thoughts:Suicidal Thoughts: No SI Active Intent and/or Plan: Without Intent; Without Plan SI Passive Intent and/or Plan: Without Intent; Without Plan  Homicidal Thoughts:Homicidal Thoughts: No HI Passive Intent and/or Plan: With Intent  Sensorium  Memory:Immediate Good  Judgment:Fair  Insight:Fair  Executive Functions  Concentration:Fair  Attention Span:Fair  [place]  Psychomotor Activity  Psychomotor Activity:Psychomotor Activity: Normal AIMS Completed?: Yes  Assets  Assets:Communication Skills  Sleep  Sleep: Good.  Physical Exam: Physical Exam HENT:     Head: Normocephalic.     Nose: Nose normal. No congestion.  Eyes:     Pupils: Pupils are equal, round, and reactive to light.  Pulmonary:     Effort: Pulmonary effort is normal. No respiratory distress.  Musculoskeletal:        General: No swelling. Normal range of motion.     Cervical back: Normal range of motion. No rigidity.  Neurological:     General: No focal deficit present.     Mental Status: He is alert and oriented to person, place, and time.     Sensory: No sensory deficit.  Psychiatric:        Behavior: Behavior normal.   Review of Systems  Constitutional: Negative.  Negative for fever.  HENT: Negative.  Negative for sore throat.   Eyes: Negative.   Respiratory: Negative.  Negative for cough, shortness of breath and wheezing.   Cardiovascular: Negative.  Negative for chest pain and palpitations.  Gastrointestinal: Negative.  Negative for heartburn, nausea and vomiting.   Genitourinary: Negative.   Musculoskeletal:  Positive for back pain, joint pain and myalgias.  Skin: Negative.   Neurological: Negative.  Negative for dizziness, tingling, tremors and headaches.  Endo/Heme/Allergies:        Allergies Bee vernom PCN Codeine Peanuts  Psychiatric/Behavioral:  Positive for depression, hallucinations, substance abuse and suicidal ideas. The patient is nervous/anxious and has insomnia.   Blood pressure 131/87, pulse 78, temperature 97.9 F (36.6 C), temperature source Oral, resp. rate 17, height 5\' 7"  (1.702 m), weight 61.7 kg, SpO2 97 %. Body mass index is 21.3 kg/m.    Physician Treatment Plan for Secondary Diagnosis:  Principal Problem:   Severe recurrent major depressive disorder with psychotic features ([place]).  The current medical regimen is effective;  continue present plan and medications.   Continue inpatient hospitalization. Will continue today 01/29/2022 plan as below except where it is noted.    Safety and Monitoring: Voluntary admission to inpatient psychiatric unit for safety, stabilization and treatment Daily contact with patient to assess and evaluate symptoms and progress in treatment Patient's case to be discussed in multi-disciplinary team meeting Observation Level : q15 minute checks Vital signs: q12 hours Precautions: suicide, elopement, and assault   Medications MDD with Psychotic Features -Continue Zyprexa 5 mg po Q bedtime for psychosis. -Continue  Zoloft 50 mg daily. -Continue Remeron 30 mg nightly -Decrease Cogentin to 0.5 mg BID  Anxiety/CIWA < or =10 -Continue Hydroxyzine 50 mg every 6 hours PRN   Benzodiazepine withdrawal (was taking Xanax 2 mg TID prior to admission, started withdrawing after admitted) - Ativan Detox Protocol complete   Insomnia Increase Trazodone to 100 mg po  nightly PRN.    Nutritional support -Continue Multivitamin daily -Continue thiamine 100 mg daily  Hypertension -Continue Norvasc 5 mg  daily   Seasonal allergies/cough. -Continue Mucinex 600 mg po bid for cough x 2 more days -Continue Claritin 10 mg daily.  -Continue Z-pack x 5 days for productive cough.  Constipation -Continue Colace daily --Continue Milk of Magnesia as needed every 6 hrs for constipation -Start Miralax Daily   Other PRNS -Continue Tylenol 650 mg every 6 hours PRN for mild pain.  -Ibuprofen 400 mg po Q 6 hrs prn for pain/fever. -Continue Maalox 30 mg every 4 hrs PRN for indigestion -Continue Zofran disintegrating tabs every 6 hrs PRN for nausea  -Continue Agitation Protocol (Zyprexa/Geodon PRN) see MAR -Continue Robaxin 500 mg Q6H PRN for back/shoulder pain. -Continue Hydroxyzine 25 mg po tid prn for anxiety. -Adm. Hydroxyzine 50 mg po once for severe anxiety (completed).  Discharge Planning: Social work and case management to assist with discharge planning and identification of hospital follow-up needs prior to discharge Estimated LOS: 5-7 days   Nicholes Rough, NP, pmhnp 01/29/2022, 3:46 PM Patient ID: [name], male   DOB: July 16, 1961, 61 y.o.   MRN: [id number]

## 2022-01-29 NOTE — Progress Notes (Addendum)
Patient stated he has been resting this afternoon.

## 2022-01-29 NOTE — Plan of Care (Signed)
Nurse discussed coping skills with patient.  

## 2022-01-29 NOTE — Progress Notes (Signed)
°   01/29/22 2103  Psych Admission Type (Psych Patients Only)  Admission Status Voluntary  Psychosocial Assessment  Patient Complaints Anxiety;Depression;Hopelessness  Eye Contact Fair  Facial Expression Flat;Sad  Affect Depressed  Speech Logical/coherent;Soft  Interaction Minimal;Guarded  Motor Activity Slow  Appearance/Hygiene Unremarkable  Behavior Characteristics Cooperative;Appropriate to situation  Mood Depressed;Anxious  Thought Process  Coherency WDL  Content WDL  Delusions None reported or observed  Perception WDL  Hallucination None reported or observed  Judgment Impaired  Confusion None  Danger to Self  Current suicidal ideation? Passive  Self-Injurious Behavior No self-injurious ideation or behavior indicators observed or expressed   Agreement Not to Harm Self Yes  Description of Agreement Verbal contract  Danger to Others  Danger to Others None reported or observed

## 2022-01-29 NOTE — Progress Notes (Signed)
D:  Patient has fair sleep, sleep medication helpful.  Good appetite, normal energy level, good concentration.  Rated depression and anxiety 8, hopeless 9.  Denied withdrawals.  Physical problems, pain, headaches, worst pain #9 in past 24 hours.  Goal is attend all groups. A:  Medications administered per MD orders.  Emotional support and encouragement given patient. R:  Denied SI and HI, contracts for safety.  Denied A/V hallucinations.  Safety maintained with 15 minute checks.

## 2022-01-30 MED ORDER — SODIUM CHLORIDE 0.9 % IN NEBU
INHALATION_SOLUTION | RESPIRATORY_TRACT | Status: AC
  Filled 2022-01-30: qty 3

## 2022-01-30 NOTE — Progress Notes (Signed)
°   01/30/22 2113  Psych Admission Type (Psych Patients Only)  Admission Status Voluntary  Psychosocial Assessment  Patient Complaints Anxiety;Depression;Hopelessness  Eye Contact Fair  Facial Expression Flat;Sad  Affect Depressed  Speech Logical/coherent;Soft  Interaction Minimal;Guarded  Motor Activity Slow  Appearance/Hygiene Unremarkable  Behavior Characteristics Cooperative;Appropriate to situation  Mood Depressed;Anxious  Thought Process  Coherency WDL  Content WDL  Delusions None reported or observed  Perception WDL  Hallucination None reported or observed  Judgment Impaired  Confusion None  Danger to Self  Current suicidal ideation? Passive  Self-Injurious Behavior No self-injurious ideation or behavior indicators observed or expressed   Agreement Not to Harm Self Yes  Description of Agreement Verbal contract  Danger to Others  Danger to Others None reported or observed

## 2022-01-30 NOTE — Plan of Care (Signed)
Nurse discussed coping skills with patient.  

## 2022-01-30 NOTE — Progress Notes (Signed)
D:  Patient's self inventory sheet, patient has fair sleep, sleep medication helpful.  Good appetite, normal energy level.  Rated anxiety, depression and hopeless #8.  Denied withdrawals.  Denied SI and HI, contracts for safety.  Denied A/V hallucinations.  Goal is attend groups.   "Anything it takes" to work on goal.   A:  Medications administered per MD orders.  Emotional support and encouragement given patient. R:  Safety maintained with 15 minute checks.

## 2022-01-30 NOTE — [facility] Group Notes (Signed)
Patient did not attend the relaxation activity. °

## 2022-01-30 NOTE — Group Note (Signed)
Date:  01/30/2022 Time:  9:43 AM  Group Topic/Focus:  Orientation:   The focus of this group is to educate the patient on the purpose and policies of crisis stabilization and provide a format to answer questions about their admission.  The group details unit policies and expectations of patients while admitted.    Participation Level:  Active  Participation Quality:  Appropriate  Affect:  Appropriate  Cognitive:  Appropriate  Insight: Appropriate  Engagement in Group:  Engaged  Modes of Intervention:  Discussion  Additional Comments:    Garvin Fila 01/30/2022, 9:43 AM

## 2022-01-30 NOTE — Group Note (Signed)
Date:  01/30/2022 Time:  11:09 AM  Group Topic/Focus:  Personal Choices and Values:   The focus of this group is to help patients assess and explore the importance of values in their lives, how their values affect their decisions, how they express their values and what opposes their expression.    Participation Level:  Active  Participation Quality:  Appropriate  Affect:  Appropriate  Cognitive:  Appropriate  Insight: Appropriate  Engagement in Group:  Engaged  Modes of Intervention:  Discussion  Additional Comments:   Garvin Fila 01/30/2022, 11:09 AM

## 2022-01-30 NOTE — Group Note (Signed)
Recreation Therapy Group Note   Group Topic:Stress Management  Group Date: 01/30/2022 Start Time: 0930 End Time: 0954 Facilitators: Caroll Rancher, LRT,CTRS Location: [address] Dayroom   Goal Area(s) Addresses:  Patient will actively participate in stress management techniques presented during session.  Patient will successfully identify benefit of practicing stress management post d/c.   Group Description: Guided Imagery. LRT provided education, instruction, and demonstration on practice of visualization via guided imagery. Patient was asked to participate in the technique introduced during session. LRT debriefed including topics of mindfulness, stress management and specific scenarios each patient could use these techniques. Patients were given suggestions of ways to access scripts post d/c and encouraged to explore Youtube and other apps available on smartphones, tablets, and computers.   Affect/Mood: Appropriate   Participation Level: Active   Participation Quality: Independent   Behavior: Appropriate   Speech/Thought Process: Focused   Insight: Good   Judgement: Good   Modes of Intervention: Script, [place]   Patient Response to Interventions:  Attentive   Education Outcome:  Acknowledges education and In group clarification offered    Clinical Observations/Individualized Feedback: Pt attended and participated in group activity.    Plan: Continue to engage patient in RT group sessions 2-3x/week.   Caroll Rancher, LRT,CTRS 01/30/2022 12:12 PM

## 2022-01-30 NOTE — Progress Notes (Signed)
Patient stated he was in the bathroom from 3--6:00 am having BMs.  Medication finally worked.  Stated he was having SI thoughts during that time.  Thinking about wife and problems.  Denied SI at this time.  Patient stated his hernia is protruding now.

## 2022-01-30 NOTE — Group Note (Signed)
LCSW Group Therapy Note ° °Group Date: 01/30/2022 °Start Time: 1300 °End Time: 1400 ° ° °Type of Therapy and Topic:  Group Therapy: Anger Cues and Responses ° °Participation Level:  Active ° ° °Description of Group:   °In this group, patients learned how to recognize the physical, cognitive, emotional, and behavioral responses they have to anger-provoking situations.  They identified a recent time they became angry and how they reacted.  They analyzed how their reaction was possibly beneficial and how it was possibly unhelpful.  The group discussed a variety of healthier coping skills that could help with such a situation in the future.  Focus was placed on how helpful it is to recognize the underlying emotions to our anger, because working on those can lead to a more permanent solution as well as our ability to focus on the important rather than the urgent. ° °Therapeutic Goals: °Patients will remember their last incident of anger and how they felt emotionally and physically, what their thoughts were at the time, and how they behaved. °Patients will identify how their behavior at that time worked for them, as well as how it worked against them. °Patients will explore possible new behaviors to use in future anger situations. °Patients will learn that anger itself is normal and cannot be eliminated, and that healthier reactions can assist with resolving conflict rather than worsening situations. ° °Summary of Patient Progress:  Pt was active during the group. Pt demonstrated insight into the subject matter, was respectful of peers, and participated throughout the entire session. ° °Therapeutic Modalities:   °Cognitive Behavioral Therapy ° ° ° °Joseph Curtis Joseph Curtis, LCSWA °01/30/2022  2:29 PM   ° °

## 2022-01-30 NOTE — BHH Counselor (Addendum)
CSW, MD and NP met with pt to discuss his disposition. Pt stated that he has called all of the boarding house resources that he was given and that the only opening was at [place] that he did not want to go there because "there are too many drugs there". Pt also stated he does not want to go to any shelter that is not in [place] or that his wife cannot go with him to. Pt also stated that he had called all [facility] in the [place] area but that no one had answered when he called. CSW reminded pt that housemates are out of the home from 8-5pm daily and due to this pt make need to call multiple times to reach someone. Pt voiced understanding. MD also suggested a hotel since pt does have income. Pt stated that he will have no funds until the beginning of the month and states that a hotel is not an option at this time.   CSW met with pt following this meeting so that pt and CSW could review what resources he had called. Pt showed CSW a listing of 5 boarding houses where he stated he called each. When he pointed to [facility] he stated that he called there but they said they were full. CSW confronted pt that his stated did not match what he said earlier when meeting with MD and NP and pt had no response. Pt states that the 5 boarding houses are the only resources he has been given since being at [facility]. CSW reminded pt that he was given a yellow folder with homeless shelter resources, [employer] and more boarding house contacts. Pt claims he never received his information. CSW gestured towards pt's belongings where yellow folder was sitting at the bottom of and had pt go and get folder. Pt open folder and stated he "forgot" about these resources and had not called any of these resources. CSW printed an updated list of [employer] and provided these to pt.   Toney Reil, [place] Worker [employer]

## 2022-01-30 NOTE — Progress Notes (Signed)
[facility] MD Progress Note  01/30/2022 2:52 PM [name]  MRN:  [id number]  Subjective:  [name] states:"It's always on my mind, but I have no plan to do anything to hurt myself. I love my daughter, my grandchildren and would not do anything to hurt them." This is in response to being asked if he is feeling suicidal.  Daily Notes: 01/30/2022: [name] is seen, chart reviewed. The chart finding discussed with the treatment team.  For this encounter, pt is seen in his room with the attending psychiatrist and his assigned Education officer, museum. Pt with flat affect and depressed mood, attention to personal hygiene and grooming is fair, eye contact is good, speech is clear & coherent. Thought contents are organized and logical. Pt  reports that suicidal thoughts are on his mind periodically, but he denies having a plan, because committing suicide will be hurtful to his daughter and grandchildren. Pt denies HI/AVH, denies paranoia, and there is no evidence of any delusional thinking.    Pt reports that he slept 5-6 hrs last night.  The need to start calling from the list of housing options provided to him was reiterated by attending Physician. Pt was educated about the need to have a safe place to go to after discharge. Pt currently has no solid & safe discharge plan at this time, and is resistant to going into substance abuse rehabs as he claims that he does not abuse substances. He states that the cocaine present in his urine drug screen is from "a laced joint". Attending Physician & social worker again talked to pt at length about the need to consider going to a substance use rehab, otherwise, there will be no other option, but to go to a shelter.  BUN is slightly elevated at 24, Creatinine slightly elevated at 1.28 from CMP of 2/8 and normalized at 1.04 from [facility] of 2/14. Sodium level is 134 (dropped from 140 as per results of 2/8). LDL is 140. Bilirubin is 0.1. Will repeat CMP tomorrow to make sure that NA is trending  up. Nutrition and Hydration are being encouraged. Pt reports a fair appetite. Pt has been educated on the need for Primary care physician follow up after discharge and verbalizes understanding. V/S with BP currently slightly elevated at 140/92. Pt reports that pain is controlled with Robaxin and Tylenol/Motrin PRN.  HPI: [name] is a 61 yo male with a history of anxiety, MDD & polysubstance abuse who presented to the [facility] after his wife found him walking to a tree where he had made a noose with the intent to hang himself. Pt was deemed a danger to himself and transferred to this [facility] for treatment and stabilization of his mood.  Principal Problem: Severe recurrent major depressive disorder with psychotic features ([place]) Diagnosis: Principal Problem:   Severe recurrent major depressive disorder with psychotic features ([place]) Active Problems:   Anxiety   Posttraumatic stress disorder   Polysubstance abuse (HCC)   MDD (major depressive disorder), recurrent episode, severe ([place])  Total Time spent with patient: 15 minutes  Past Psychiatric History: As above  Past Medical History:  Past Medical History:  Diagnosis Date   Anxiety    Arthritis    Back pain    chronic   Depression    Fall    from construction site   Headache    Hypertension    Legg-Calve-Perthes disease, bilateral    Neck pain    chronic   Panic attacks  Pneumonia     Past Surgical History:  Procedure Laterality Date   HERNIA REPAIR     JOINT REPLACEMENT     right knee, left shoulder   ORCHIECTOMY Left    Family History: History reviewed. No pertinent family history.  Family Psychiatric  History: None reported  Social History:  Social History   Substance and Sexual Activity  Alcohol Use Not Currently     Social History   Substance and Sexual Activity  Drug Use Not Currently   Types: Cocaine, Heroin, Marijuana   Comment: states stopped using drugs few months back.    Social History    Socioeconomic History   Marital status: Married    Spouse name: Not on file   Number of children: Not on file   Years of education: Not on file   Highest education level: Not on file  Occupational History   Not on file  Tobacco Use   Smoking status: Every Day    Packs/day: 0.50    Years: 30.00    Pack years: 15.00    Types: Cigarettes   Smokeless tobacco: Never  Vaping Use   Vaping Use: Never used  Substance and Sexual Activity   Alcohol use: Not Currently   Drug use: Not Currently    Types: Cocaine, Heroin, Marijuana    Comment: states stopped using drugs few months back.   Sexual activity: Not Currently  Other Topics Concern   Not on file  Social History Narrative   Not on file   Social Determinants of Health   Financial Resource Strain: Not on file  Food Insecurity: Not on file  Transportation Needs: Not on file  Physical Activity: Not on file  Stress: Not on file  Social Connections: Not on file   Sleep: Good  Appetite:  Good  Current Medications: Current Facility-Administered Medications  Medication Dose Route Frequency Provider Last Rate Last Admin   acetaminophen (TYLENOL) tablet 650 mg  650 mg Oral Q6H PRN Revonda Humphrey, NP   650 mg at 01/26/22 1319   alum & mag hydroxide-simeth (MAALOX/MYLANTA) 200-200-20 MG/5ML suspension 30 mL  30 mL Oral Q4H PRN Revonda Humphrey, NP   30 mL at 01/27/22 2140   amLODipine (NORVASC) tablet 5 mg  5 mg Oral Daily Revonda Humphrey, NP   5 mg at 01/30/22 [id number]   benztropine (COGENTIN) tablet 0.5 mg  0.5 mg Oral BID Nicholes Rough, NP   0.5 mg at 01/30/22 0751   docusate sodium (COLACE) capsule 100 mg  100 mg Oral Daily Nelda Marseille, Amy E, MD   100 mg at 01/30/22 0753   hydrOXYzine (ATARAX) tablet 25 mg  25 mg Oral Q4H PRN Lindell Spar I, NP   25 mg at 01/29/22 1716   ibuprofen (ADVIL) tablet 400 mg  400 mg Oral Q6H PRN Lindell Spar I, NP   400 mg at 01/29/22 1714   loratadine (CLARITIN) tablet 10 mg  10 mg Oral Daily  Revonda Humphrey, NP   10 mg at 01/30/22 0753   magnesium hydroxide (MILK OF MAGNESIA) suspension 30 mL  30 mL Oral Daily PRN Revonda Humphrey, NP   30 mL at 01/29/22 0736   menthol-cetylpyridinium (CEPACOL) lozenge 3 mg  1 lozenge Oral PRN Janine Limbo, MD   3 mg at 01/24/22 0953   methocarbamol (ROBAXIN) tablet 500 mg  500 mg Oral Q6H PRN Nicholes Rough, NP   500 mg at 01/29/22 0737   mirtazapine (REMERON) tablet 30 mg  30 mg Oral QHS Lindell Spar I, NP   30 mg at 01/29/22 2103   multivitamin with minerals tablet 1 tablet  1 tablet Oral Daily Nicholes Rough, NP   1 tablet at 01/30/22 0753   OLANZapine (ZYPREXA) tablet 5 mg  5 mg Oral QHS Nwoko, Agnes I, NP   5 mg at 01/29/22 2103   OLANZapine zydis (ZYPREXA) disintegrating tablet 5 mg  5 mg Oral Q8H PRN Nicholes Rough, NP   5 mg at 01/29/22 2103   And   ziprasidone (GEODON) injection 20 mg  20 mg Intramuscular PRN Nicholes Rough, NP       ondansetron (ZOFRAN-ODT) disintegrating tablet 4 mg  4 mg Oral Q6H PRN Nicholes Rough, NP   4 mg at 01/27/22 2138   polyethylene glycol (MIRALAX / GLYCOLAX) packet 17 g  17 g Oral Daily Nicholes Rough, NP   17 g at 01/30/22 0754   sertraline (ZOLOFT) tablet 50 mg  50 mg Oral Daily Nicholes Rough, NP   50 mg at 01/30/22 [id number]   thiamine tablet 100 mg  100 mg Oral Daily Nicholes Rough, NP   100 mg at 01/30/22 0754   traZODone (DESYREL) tablet 100 mg  100 mg Oral QHS PRN Nicholes Rough, NP   100 mg at 01/29/22 2103   Lab Results:  Results for orders placed or performed during the hospital encounter of 01/21/22 (from the past 48 hour(s))  Resp Panel by RT-PCR (Flu A&B, Covid) Nasopharyngeal Swab     Status: None   Collection Time: 01/28/22  4:23 PM   Specimen: Nasopharyngeal Swab; Nasopharyngeal(NP) swabs in vial transport medium  Result Value Ref Range   SARS Coronavirus 2 by RT PCR NEGATIVE NEGATIVE    Comment: (NOTE) SARS-CoV-2 target nucleic acids are NOT DETECTED.  The SARS-CoV-2 RNA is  generally detectable in upper respiratory specimens during the acute phase of infection. The lowest concentration of SARS-CoV-2 viral copies this assay can detect is 138 copies/mL. A negative result does not preclude SARS-Cov-2 infection and should not be used as the sole basis for treatment or other patient management decisions. A negative result may occur with  improper specimen collection/handling, submission of specimen other than nasopharyngeal swab, presence of viral mutation(s) within the areas targeted by this assay, and inadequate number of viral copies(<138 copies/mL). A negative result must be combined with clinical observations, patient history, and epidemiological information. The expected result is Negative.  Fact Sheet for Patients:  EntrepreneurPulse.com.au  Fact Sheet for Healthcare Providers:  IncredibleEmployment.be  This test is no t yet approved or cleared by the [place] FDA and  has been authorized for detection and/or diagnosis of SARS-CoV-2 by FDA under an Emergency Use Authorization (EUA). This EUA will remain  in effect (meaning this test can be used) for the duration of the COVID-19 declaration under Section 564(b)(1) of the Act, 21 U.S.C.section 360bbb-3(b)(1), unless the authorization is terminated  or revoked sooner.       Influenza A by PCR NEGATIVE NEGATIVE   Influenza B by PCR NEGATIVE NEGATIVE    Comment: (NOTE) The Xpert Xpress SARS-CoV-2/FLU/RSV plus assay is intended as an aid in the diagnosis of influenza from Nasopharyngeal swab specimens and should not be used as a sole basis for treatment. Nasal washings and aspirates are unacceptable for Xpert Xpress SARS-CoV-2/FLU/RSV testing.  Fact Sheet for Patients: EntrepreneurPulse.com.au  Fact Sheet for Healthcare Providers: IncredibleEmployment.be  This test is not yet approved or cleared by the [place]  and  has been authorized for detection and/or diagnosis of SARS-CoV-2 by FDA under an Emergency Use Authorization (EUA). This EUA will remain in effect (meaning this test can be used) for the duration of the COVID-19 declaration under Section 564(b)(1) of the Act, 21 U.S.C. section 360bbb-3(b)(1), unless the authorization is terminated or revoked.  Performed at [facility], [place] [address] [place], [place] [id number]   Basic metabolic panel     Status: Abnormal   Collection Time: 01/29/22  6:54 PM  Result Value Ref Range   Sodium 134 (L) 135 - 145 mmol/L   Potassium 4.7 3.5 - 5.1 mmol/L   Chloride 102 98 - 111 mmol/L   CO2 24 22 - 32 mmol/L   Glucose, Bld 85 70 - 99 mg/dL    Comment: Glucose reference range applies only to samples taken after fasting for at least 8 hours.   BUN 31 (H) 6 - 20 mg/dL   Creatinine, Ser 1.04 0.61 - 1.24 mg/dL   Calcium 9.1 8.9 - 10.3 mg/dL   GFR, Estimated >60 >60 mL/min    Comment: (NOTE) Calculated using the CKD-EPI Creatinine Equation (2021)    Anion gap 8 5 - 15    Comment: Performed at [facility], [place] [address], [place], [place] [address]    Blood Alcohol level:  Lab Results  Component Value Date   ETH <10 01/20/2022   ETH <10 0000000   Metabolic Disorder Labs: Lab Results  Component Value Date   HGBA1C 5.1 01/20/2022   MPG 99.67 01/20/2022   No results found for: PROLACTIN Lab Results  Component Value Date   CHOL 199 01/20/2022   TRIG 70 01/20/2022   HDL 45 01/20/2022   CHOLHDL 4.4 01/20/2022   VLDL 14 01/20/2022   LDLCALC 140 (H) 01/20/2022   Physical Findings: AIMS: Facial and Oral Movements Muscles of Facial Expression: None, normal Lips and Perioral Area: None, normal Jaw: None, normal Tongue: None, normal,Extremity Movements Upper (arms, wrists, hands, fingers): None, normal Lower (legs, knees, ankles, toes): None, normal, Trunk Movements Neck, shoulders, hips: None, normal,  Overall Severity Severity of abnormal movements (highest score from questions above): None, normal Incapacitation due to abnormal movements: None, normal Patient's awareness of abnormal movements (rate only patient's report): No Awareness, Dental Status Current problems with teeth and/or dentures?: No Does patient usually wear dentures?: No  CIWA:  CIWA-Ar Total: 1 COWS:  N/A AIMS: 0  Musculoskeletal: Strength & Muscle Tone: within normal limits Gait & Station: normal Patient leans: N/A  Psychiatric Specialty Exam:  Presentation  General Appearance: Appropriate for Environment  Eye Contact:Fair  Speech:Clear and Coherent  Speech Volume:Normal  Handedness:Right  Mood and Affect  Mood:Depressed  Affect:Congruent; Flat  Thought Process  Thought Processes:Coherent  Descriptions of Associations:Intact  Orientation:Full (Time, Place and Person)  Thought Content:Logical  History of Schizophrenia/Schizoaffective disorder: NA  Duration of Psychotic Symptoms: NA  Hallucinations:Hallucinations: None  Ideas of Reference:None  Suicidal Thoughts:Suicidal Thoughts: No  Homicidal Thoughts:Homicidal Thoughts: No HI Passive Intent and/or Plan: With Intent  Sensorium  Memory:Immediate Good; Recent Good  Judgment:Poor  Insight:Poor  Executive Functions  Concentration:Fair  Attention Span:Fair  [place]  Psychomotor Activity  Psychomotor Activity:Psychomotor Activity: Normal AIMS Completed?: Yes  Assets  Assets:Communication Skills  Sleep  Sleep: Good.  Physical Exam: Physical Exam HENT:     Head: Normocephalic.     Nose: Nose normal. No congestion.  Eyes:     Pupils: Pupils are  equal, round, and reactive to light.  Pulmonary:     Effort: Pulmonary effort is normal. No respiratory distress.  Musculoskeletal:        General: No swelling. Normal range of motion.     Cervical back: Normal range of motion.  No rigidity.  Neurological:     General: No focal deficit present.     Mental Status: He is alert and oriented to person, place, and time.     Sensory: No sensory deficit.  Psychiatric:        Behavior: Behavior normal.   Review of Systems  Constitutional: Negative.  Negative for fever.  HENT: Negative.  Negative for sore throat.   Eyes: Negative.   Respiratory: Negative.  Negative for cough, shortness of breath and wheezing.   Cardiovascular: Negative.  Negative for chest pain and palpitations.  Gastrointestinal: Negative.  Negative for heartburn, nausea and vomiting.  Genitourinary: Negative.   Musculoskeletal:  Positive for back pain, joint pain and myalgias.  Skin: Negative.   Neurological: Negative.  Negative for dizziness, tingling, tremors and headaches.  Endo/Heme/Allergies:        Allergies Bee vernom PCN Codeine Peanuts  Psychiatric/Behavioral:  Positive for depression and substance abuse. Negative for hallucinations and suicidal ideas. The patient is nervous/anxious (improving with current medications) and has insomnia.   Blood pressure (!) 142/80, pulse 79, temperature 97.7 F (36.5 C), resp. rate 17, height 5\' 7"  (1.702 m), weight 61.7 kg, SpO2 99 %. Body mass index is 21.3 kg/m.    Physician Treatment Plan for Secondary Diagnosis:  Principal Problem:   Severe recurrent major depressive disorder with psychotic features ([place]).  The current medical regimen is effective;  continue present plan and medications.   Continue inpatient hospitalization. Will continue today 01/30/2022 plan as below except where it is noted.    Safety and Monitoring: Voluntary admission to inpatient psychiatric unit for safety, stabilization and treatment Daily contact with patient to assess and evaluate symptoms and progress in treatment Patient's case to be discussed in multi-disciplinary team meeting Observation Level : q15 minute checks Vital signs: q12 hours Precautions: suicide,  elopement, and assault   Medications MDD with Psychotic Features -Continue Zyprexa 5 mg po Q bedtime for psychosis. -Continue  Zoloft 50 mg daily. -Continue Remeron 30 mg nightly -Continue Cogentin to 0.5 mg BID  Anxiety/CIWA < or =10 -Continue Hydroxyzine 50 mg every 6 hours PRN   Benzodiazepine withdrawal (was taking Xanax 2 mg TID prior to admission, started withdrawing after admitted) - Ativan Detox Protocol complete   Insomnia -Continue Trazodone to 100 mg po  nightly PRN.    Nutritional support -Continue Multivitamin daily -Continue thiamine 100 mg daily  Hypertension -Continue Norvasc 5 mg daily   Seasonal allergies/cough. -Mucinex 600 mg po bid for cough x 2 more days (completed) -Continue Claritin 10 mg daily.  - Z-pack x 5 days for productive cough (completed).  Constipation -Continue Colace daily -Continue Milk of Magnesia as needed every 6 hrs for constipation -Continue Miralax Daily   Other PRNS -Continue Tylenol 650 mg every 6 hours PRN for mild pain.  -Ibuprofen 400 mg po Q 6 hrs prn for pain/fever. -Continue Maalox 30 mg every 4 hrs PRN for indigestion -Continue Zofran disintegrating tabs every 6 hrs PRN for nausea  -Continue Agitation Protocol (Zyprexa/Geodon PRN) see MAR -Continue Robaxin 500 mg Q6H PRN for back/shoulder pain. -Continue Hydroxyzine 25 mg po tid prn for anxiety. -Adm. Hydroxyzine 50 mg po once for severe anxiety (completed).  Discharge  Planning: Social work and case management to assist with discharge planning and identification of hospital follow-up needs prior to discharge (Ongoing) Estimated LOS: 5-7 days   Nicholes Rough, NP, pmhnp 01/30/2022, 2:52 PM Patient ID: Joseph Curtis, male   DOB: 06-12-1961, 61 y.o.   MRN: [id number]

## 2022-01-31 LAB — COMPREHENSIVE METABOLIC PANEL
ALT: 29 U/L (ref 0–44)
AST: 28 U/L (ref 15–41)
Albumin: 4.1 g/dL (ref 3.5–5.0)
Alkaline Phosphatase: 67 U/L (ref 38–126)
Anion gap: 9 (ref 5–15)
BUN: 24 mg/dL — ABNORMAL HIGH (ref 6–20)
CO2: 23 mmol/L (ref 22–32)
Calcium: 9.3 mg/dL (ref 8.9–10.3)
Chloride: 106 mmol/L (ref 98–111)
Creatinine, Ser: 0.81 mg/dL (ref 0.61–1.24)
GFR, Estimated: >60 mL/min (ref >60)
Glucose, Bld: 93 mg/dL (ref 70–99)
Potassium: 4.1 mmol/L (ref 3.5–5.1)
Sodium: 138 mmol/L (ref 135–145)
Total Bilirubin: 0.4 mg/dL (ref 0.3–1.2)
Total Protein: 7.4 g/dL (ref 6.5–8.1)

## 2022-01-31 NOTE — Progress Notes (Signed)
°   01/31/22 2128  Psych Admission Type (Psych Patients Only)  Admission Status Voluntary  Psychosocial Assessment  Patient Complaints Anxiety;Depression  Eye Contact Fair  Facial Expression Flat;Sad  Affect Appropriate to circumstance;Depressed  Speech Logical/coherent  Interaction Minimal;Guarded  Motor Activity Slow  Appearance/Hygiene Unremarkable  Behavior Characteristics Cooperative;Appropriate to situation  Mood Depressed;Anxious  Thought Process  Coherency WDL  Content WDL  Delusions None reported or observed  Perception WDL  Hallucination None reported or observed  Judgment Impaired  Confusion None  Danger to Self  Current suicidal ideation? Passive  Self-Injurious Behavior No self-injurious ideation or behavior indicators observed or expressed   Agreement Not to Harm Self Yes  Description of Agreement Verbal contract  Danger to Others  Danger to Others None reported or observed

## 2022-01-31 NOTE — Progress Notes (Signed)
Pt did not attend group. 

## 2022-01-31 NOTE — Progress Notes (Addendum)
[facility] MD Progress Note  01/31/2022 4:25 PM Joseph Curtis  MRN:  [id number]  Subjective:  Joseph Curtis states:"I feel a little better knowing that my wife is out of the ICU. I have been calling places to find a place for [place] to stay after we get out of the hospital. I found one place that will cost [place] $130.00 a week with a $300.00 enrollment fee. There also a choice to pay $500.00 a month instead of paying weekly. Then, I have also talked to my daughter. She may allow [place] to come stay at her house briefly to get ourselves together. This is good news for [place]. I think I'm good for discharge tomorrow. My mood is better, no SIHI, no voices today, no seeing stuff, no paranoia or funny thinking". Daily Notes: 01/31/2022: Joseph Curtis is seen, chart reviewed. The chart finding discussed with the treatment team.  He presents better, seem to be feeling a lot better as he reported. His  eye contact is good, speech is clear & coherent. Thought contents are organized and logical. Pt denies any SIHI/AVH, denies paranoia, and there is no evidence of any delusional thinking. He is scheduled to be discharged in am & he says he is up to it. He remains on current plan of care as already in progress.  HPI: Joseph Curtis is a 61 yo male with a history of anxiety, MDD & polysubstance abuse who presented to the [facility] after his wife found him walking to a tree where he had made a noose with the intent to hang himself. Pt was deemed a danger to himself and transferred to this [facility] for treatment and stabilization of his mood.  Principal Problem: Severe recurrent major depressive disorder with psychotic features ([place]) Diagnosis: Principal Problem:   Severe recurrent major depressive disorder with psychotic features ([place]) Active Problems:   Anxiety   Posttraumatic stress disorder   Polysubstance abuse (HCC)   MDD (major depressive disorder), recurrent episode, severe ([place])  Total Time spent with patient: 15 minutes  Past  Psychiatric History: As above  Past Medical History:  Past Medical History:  Diagnosis Date   Anxiety    Arthritis    Back pain    chronic   Depression    Fall    from construction site   Headache    Hypertension    Legg-Calve-Perthes disease, bilateral    Neck pain    chronic   Panic attacks    Pneumonia     Past Surgical History:  Procedure Laterality Date   HERNIA REPAIR     JOINT REPLACEMENT     right knee, left shoulder   ORCHIECTOMY Left    Family History: History reviewed. No pertinent family history.  Family Psychiatric  History: None reported  Social History:  Social History   Substance and Sexual Activity  Alcohol Use Not Currently     Social History   Substance and Sexual Activity  Drug Use Not Currently   Types: Cocaine, Heroin, Marijuana   Comment: states stopped using drugs few months back.    Social History   Socioeconomic History   Marital status: Married    Spouse name: Not on file   Number of children: Not on file   Years of education: Not on file   Highest education level: Not on file  Occupational History   Not on file  Tobacco Use   Smoking status: Every Day    Packs/day: 0.50    Years: 30.00  Pack years: 15.00    Types: Cigarettes   Smokeless tobacco: Never  Vaping Use   Vaping Use: Never used  Substance and Sexual Activity   Alcohol use: Not Currently   Drug use: Not Currently    Types: Cocaine, Heroin, Marijuana    Comment: states stopped using drugs few months back.   Sexual activity: Not Currently  Other Topics Concern   Not on file  Social History Narrative   Not on file   Social Determinants of Health   Financial Resource Strain: Not on file  Food Insecurity: Not on file  Transportation Needs: Not on file  Physical Activity: Not on file  Stress: Not on file  Social Connections: Not on file   Sleep: Good  Appetite:  Good  Current Medications: Current Facility-Administered Medications  Medication Dose  Route Frequency Provider Last Rate Last Admin   acetaminophen (TYLENOL) tablet 650 mg  650 mg Oral Q6H PRN Revonda Humphrey, NP   650 mg at 01/31/22 0904   alum & mag hydroxide-simeth (MAALOX/MYLANTA) 200-200-20 MG/5ML suspension 30 mL  30 mL Oral Q4H PRN Revonda Humphrey, NP   30 mL at 01/27/22 2140   amLODipine (NORVASC) tablet 5 mg  5 mg Oral Daily Revonda Humphrey, NP   5 mg at 01/31/22 [id number]   benztropine (COGENTIN) tablet 0.5 mg  0.5 mg Oral BID Nicholes Rough, NP   0.5 mg at 01/31/22 [id number]   docusate sodium (COLACE) capsule 100 mg  100 mg Oral Daily Nelda Marseille, Tayshaun Kroh E, MD   100 mg at 01/31/22 [id number]   hydrOXYzine (ATARAX) tablet 25 mg  25 mg Oral Q4H PRN Lindell Spar I, NP   25 mg at 01/31/22 1207   ibuprofen (ADVIL) tablet 400 mg  400 mg Oral Q6H PRN Lindell Spar I, NP   400 mg at 01/31/22 1315   loratadine (CLARITIN) tablet 10 mg  10 mg Oral Daily Revonda Humphrey, NP   10 mg at 01/31/22 0905   magnesium hydroxide (MILK OF MAGNESIA) suspension 30 mL  30 mL Oral Daily PRN Revonda Humphrey, NP   30 mL at 01/29/22 0736   menthol-cetylpyridinium (CEPACOL) lozenge 3 mg  1 lozenge Oral PRN Janine Limbo, MD   3 mg at 01/24/22 0953   methocarbamol (ROBAXIN) tablet 500 mg  500 mg Oral Q6H PRN Nicholes Rough, NP   500 mg at 01/29/22 0737   mirtazapine (REMERON) tablet 30 mg  30 mg Oral QHS Lindell Spar I, NP   30 mg at 01/30/22 2113   multivitamin with minerals tablet 1 tablet  1 tablet Oral Daily Nicholes Rough, NP   1 tablet at 01/31/22 0903   OLANZapine (ZYPREXA) tablet 5 mg  5 mg Oral QHS Nwoko, Herbert Pun I, NP   5 mg at 01/30/22 2113   OLANZapine zydis (ZYPREXA) disintegrating tablet 5 mg  5 mg Oral Q8H PRN Nicholes Rough, NP   5 mg at 01/29/22 2103   And   ziprasidone (GEODON) injection 20 mg  20 mg Intramuscular PRN Nicholes Rough, NP       ondansetron (ZOFRAN-ODT) disintegrating tablet 4 mg  4 mg Oral Q6H PRN Nicholes Rough, NP   4 mg at 01/27/22 2138   polyethylene glycol (MIRALAX /  GLYCOLAX) packet 17 g  17 g Oral Daily Nicholes Rough, NP   17 g at 01/30/22 0754   sertraline (ZOLOFT) tablet 50 mg  50 mg Oral Daily Nkwenti, Doris, NP   50 mg at  01/31/22 0903   thiamine tablet 100 mg  100 mg Oral Daily Nicholes Rough, NP   100 mg at 01/31/22 [id number]   traZODone (DESYREL) tablet 100 mg  100 mg Oral QHS PRN Nicholes Rough, NP   100 mg at 01/30/22 2113   Lab Results:  Results for orders placed or performed during the hospital encounter of 01/21/22 (from the past 48 hour(s))  Basic metabolic panel     Status: Abnormal   Collection Time: 01/29/22  6:54 PM  Result Value Ref Range   Sodium 134 (L) 135 - 145 mmol/L   Potassium 4.7 3.5 - 5.1 mmol/L   Chloride 102 98 - 111 mmol/L   CO2 24 22 - 32 mmol/L   Glucose, Bld 85 70 - 99 mg/dL    Comment: Glucose reference range applies only to samples taken after fasting for at least 8 hours.   BUN 31 (H) 6 - 20 mg/dL   Creatinine, Ser 1.04 0.61 - 1.24 mg/dL   Calcium 9.1 8.9 - 10.3 mg/dL   GFR, Estimated >60 >60 mL/min    Comment: (NOTE) Calculated using the CKD-EPI Creatinine Equation (2021)    Anion gap 8 5 - 15    Comment: Performed at [facility], [place] [address] [place], [place] [address]  Comprehensive metabolic panel     Status: Abnormal   Collection Time: 01/31/22  6:30 AM  Result Value Ref Range   Sodium 138 135 - 145 mmol/L   Potassium 4.1 3.5 - 5.1 mmol/L   Chloride 106 98 - 111 mmol/L   CO2 23 22 - 32 mmol/L   Glucose, Bld 93 70 - 99 mg/dL    Comment: Glucose reference range applies only to samples taken after fasting for at least 8 hours.   BUN 24 (H) 6 - 20 mg/dL   Creatinine, Ser 0.81 0.61 - 1.24 mg/dL   Calcium 9.3 8.9 - 10.3 mg/dL   Total Protein 7.4 6.5 - 8.1 g/dL   Albumin 4.1 3.5 - 5.0 g/dL   AST 28 15 - 41 U/L   ALT 29 0 - 44 U/L   Alkaline Phosphatase 67 38 - 126 U/L   Total Bilirubin 0.4 0.3 - 1.2 mg/dL   GFR, Estimated >60 >60 mL/min    Comment: (NOTE) Calculated using the  CKD-EPI Creatinine Equation (2021)    Anion gap 9 5 - 15    Comment: Performed at [facility], [place] [address] [place], [place] [address]    Blood Alcohol level:  Lab Results  Component Value Date   ETH <10 01/20/2022   ETH <10 0000000   Metabolic Disorder Labs: Lab Results  Component Value Date   HGBA1C 5.1 01/20/2022   MPG 99.67 01/20/2022   No results found for: PROLACTIN Lab Results  Component Value Date   CHOL 199 01/20/2022   TRIG 70 01/20/2022   HDL 45 01/20/2022   CHOLHDL 4.4 01/20/2022   VLDL 14 01/20/2022   LDLCALC 140 (H) 01/20/2022   Physical Findings: AIMS: Facial and Oral Movements Muscles of Facial Expression: None, normal Lips and Perioral Area: None, normal Jaw: None, normal Tongue: None, normal,Extremity Movements Upper (arms, wrists, hands, fingers): None, normal Lower (legs, knees, ankles, toes): None, normal, Trunk Movements Neck, shoulders, hips: None, normal, Overall Severity Severity of abnormal movements (highest score from questions above): None, normal Incapacitation due to abnormal movements: None, normal Patient's awareness of abnormal movements (rate only patient's report): No Awareness, Dental Status Current  problems with teeth and/or dentures?: No Does patient usually wear dentures?: No  CIWA:  CIWA-Ar Total: 1 COWS:  N/A AIMS: 0  Musculoskeletal: Strength & Muscle Tone: within normal limits Gait & Station: normal Patient leans: N/A  Psychiatric Specialty Exam:  Presentation  General Appearance: Appropriate for Environment  Eye Contact:Good  Speech:Clear and Coherent  Speech Volume:Normal  Handedness:Right  Mood and Affect  Mood:reported as improved - appears more euthymic  Affect:less constricted  Thought Process  Thought Processes:Coherent  Descriptions of Associations:Intact  Orientation:Full (Time, Place and Person)  Thought Content:Logical  History of Schizophrenia/Schizoaffective  disorder: NA  Duration of Psychotic Symptoms: NA  Hallucinations:Hallucinations: None  Ideas of Reference:None  Suicidal Thoughts:Suicidal Thoughts: No  Homicidal Thoughts:Homicidal Thoughts: No  Sensorium  Memory:Immediate Good; Recent Good  Judgment:Improving  Insight:Fair  Executive Functions  Concentration:Good  Attention Span:Good  Joseph Curtis  Language:Good  Psychomotor Activity  Psychomotor Activity:Normal  Assets  Assets:Communication Skills  Sleep  Total time unrecorded  Physical Exam HENT:     Head: Normocephalic.     Nose: Nose normal. No congestion.  Eyes:     Pupils: Pupils are equal, round, and reactive to light.  Pulmonary:     Effort: Pulmonary effort is normal. No respiratory distress.  Musculoskeletal:        General: No swelling. Normal range of motion.     Cervical back: Normal range of motion. No rigidity.  Neurological:     General: No focal deficit present.     Mental Status: He is alert and oriented to person, place, and time.     Sensory: No sensory deficit.  Psychiatric:        Behavior: Behavior normal.   Review of Systems  Constitutional: Negative.  Negative for fever.  HENT: Negative.  Negative for sore throat.   Eyes: Negative.   Respiratory: Negative.  Negative for cough, shortness of breath and wheezing.   Cardiovascular: Negative.  Negative for chest pain and palpitations.  Gastrointestinal: Negative.  Negative for heartburn, nausea and vomiting.  Genitourinary: Negative.   Musculoskeletal:  Positive for back pain, joint pain and myalgias.  Skin: Negative.   Neurological: Negative.  Negative for dizziness, tingling, tremors and headaches.  Endo/Heme/Allergies:        Allergies Bee vernom PCN Codeine Peanuts  Psychiatric/Behavioral:  Positive for depression and substance abuse. Negative for hallucinations and suicidal ideas. The patient is nervous/anxious (improving with current  medications) and has insomnia.   Blood pressure (!) 139/94, pulse 73, temperature 98 F (36.7 C), temperature source Oral, resp. rate 17, height 5\' 7"  (1.702 m), weight 61.7 kg, SpO2 98 %. Body mass index is 21.3 kg/m.    Physician Treatment Plan for Secondary Diagnosis:  Principal Problem:   Severe recurrent major depressive disorder with psychotic features ([place]).  The current medical regimen is effective;  continue present plan and medications.   Continue inpatient hospitalization. Will continue today 01/31/2022 plan as below except where it is noted.    Safety and Monitoring: Voluntary admission to inpatient psychiatric unit for safety, stabilization and treatment Daily contact with patient to assess and evaluate symptoms and progress in treatment Patient's case to be discussed in multi-disciplinary team meeting Observation Level : q15 minute checks Vital signs: q12 hours Precautions: suicide, elopement, and assault   Medications MDD with Psychotic Features -Continue Zyprexa 5 mg po Q bedtime for psychosis.(Lipid panel WNL except LDL 140, A1c 5.1, QTc 413ms) -Continue  Zoloft 50 mg daily. -Continue Remeron  30 mg nightly -Continue Cogentin 0.5 mg BID  Anxiety/CIWA < or =10 -Continue Hydroxyzine 50 mg every 6 hours PRN   Benzodiazepine withdrawal (was taking Xanax 2 mg TID prior to admission, started withdrawing after admitted) - Ativan Detox Protocol complete   Insomnia -Continue Trazodone to 100 mg po  nightly PRN.    Nutritional support -Continue Multivitamin daily -Continue thiamine 100 mg daily  Hypertension -Continue Norvasc 5 mg daily   Seasonal allergies/cough. -Mucinex 600 mg po bid for cough x 2 more days (completed) -Continue Claritin 10 mg daily.  - Z-pack x 5 days for productive cough (completed).  Constipation -Continue Colace daily -Continue Milk of Magnesia as needed every 6 hrs for constipation -Continue Miralax Daily  Hyponatremia - resolved -  Repeat CMP today shows Na+ 138   Other PRNS -Continue Tylenol 650 mg every 6 hours PRN for mild pain.  -Ibuprofen 400 mg po Q 6 hrs prn for pain/fever. -Continue Maalox 30 mg every 4 hrs PRN for indigestion -Continue Zofran disintegrating tabs every 6 hrs PRN for nausea  -Continue Agitation Protocol (Zyprexa/Geodon PRN) see MAR -Continue Robaxin 500 mg Q6H PRN for back/shoulder pain. -Continue Hydroxyzine 25 mg po tid prn for anxiety. -Adm. Hydroxyzine 50 mg po once for severe anxiety (completed).  Discharge Planning: Social work and case management to assist with discharge planning and identification of hospital follow-up needs prior to discharge (Ongoing) Estimated LOS: 5-7 days   Lindell Spar, NP, pmhnp 01/31/2022, 4:25 PM Patient ID: Joseph Curtis, male   DOB: 12-12-1961, 61 y.o.   MRN: [id number] Patient ID: Joseph Curtis, male   DOB: 1961-10-30, 61 y.o.   MRN: [id number]

## 2022-02-01 ENCOUNTER — Encounter (HOSPITAL_COMMUNITY): Payer: Self-pay

## 2022-02-01 DIAGNOSIS — Z23 Encounter for immunization: Secondary | ICD-10-CM | POA: Diagnosis present

## 2022-02-01 MED ORDER — BENZTROPINE MESYLATE 0.5 MG PO TABS: 0.5000 mg | ORAL_TABLET | Freq: Two times a day (BID) | ORAL | 0 refills | Status: AC

## 2022-02-01 MED ORDER — AMLODIPINE BESYLATE 5 MG PO TABS: 5.0000 mg | ORAL_TABLET | Freq: Every day | ORAL | 0 refills | Status: AC

## 2022-02-01 MED ORDER — LORATADINE 10 MG PO TABS: 10.0000 mg | ORAL_TABLET | Freq: Every day | ORAL | Status: AC

## 2022-02-01 MED ORDER — OLANZAPINE 5 MG PO TABS: 5.0000 mg | ORAL_TABLET | Freq: Every day | ORAL | 0 refills | Status: AC

## 2022-02-01 MED ORDER — MIRTAZAPINE 30 MG PO TABS: 30.0000 mg | ORAL_TABLET | Freq: Every day | ORAL | 0 refills | Status: AC

## 2022-02-01 MED ORDER — DOCUSATE SODIUM 100 MG PO CAPS: 100.0000 mg | ORAL_CAPSULE | Freq: Every day | ORAL | 0 refills | Status: AC

## 2022-02-01 MED ORDER — HYDROXYZINE HCL 25 MG PO TABS: 25.0000 mg | ORAL_TABLET | ORAL | 0 refills | Status: AC | PRN

## 2022-02-01 MED ORDER — ADULT MULTIVITAMIN W/MINERALS CH: 1.0000 | ORAL_TABLET | Freq: Every day | ORAL | Status: AC

## 2022-02-01 MED ORDER — SERTRALINE HCL 50 MG PO TABS: 50.0000 mg | ORAL_TABLET | Freq: Every day | ORAL | 0 refills | Status: DC

## 2022-02-01 NOTE — Group Note (Signed)
°  Date/Time:02/01/2022 @ 1pm  Type of Therapy and Topic:  Group Therapy:  Recognizing Triggers  Participation Level:  Minimal  Description of Group:   Recognizing Triggers: Patients defined triggers and discussed the importance of recognizing their personal warning signs. Patients identified their own triggers and how they tend to cope with stressful situations. Patients discussed areas such as people, places, things, and thoughts that rigger certain emotions for them. CSW provided support to patients and discussed safety planning for when these triggers occur. Group participants had opportunities to share openly with the group and participate in a group discussion while providing support and feedback to their peers.  Therapeutic Goals: Patient will identify triggers that are contributing to a problem in their life Patient will identify unwanted behaviors and feelings associated with a trigger.  Patient will share with other group members strategies to confront and avoid triggers so that they may be able to react appropriately to triggers in daily life.    Summary of Patient Progress:  Patient participated in introductions.  Patient participated minimally in the rest of the group.      Therapeutic Modalities:   Cognitive Behavioral Therapy Solution Focused Therapy Motivational Interviewing Family Systems Approach   Joseph Curtis [place], LCSW, LCAS Clincal Social Worker  [facility]

## 2022-02-01 NOTE — Care Management Important Message (Signed)
Medicare IM given to pt.   Joseph Curtis, LCSWA Clinicial Social Worker [employer]

## 2022-02-01 NOTE — BHH Group Notes (Signed)
Adult Psychoeducational Group Note  Date:  02/01/2022 Time:  1:30 PM  Group Topic/Focus:  Goals Group:   The focus of this group is to help patients establish daily goals to achieve during treatment and discuss how the patient can incorporate goal setting into their daily lives to aide in recovery.  Participation Level:  Active  Participation Quality:  Appropriate  Affect:  Appropriate  Cognitive:  Appropriate  Insight: Appropriate  Engagement in Group:  Engaged  Modes of Intervention:  Discussion  Additional Comments: Patient attended morning orientation/goal group and said that he was excited that he will be discharged today.   Chloe Baig W Rahn Lacuesta 02/01/2022, 1:30 PM

## 2022-02-01 NOTE — Progress Notes (Signed)
°  [facility] Adult Case Management Discharge Plan :  Will you be returning to the same living situation after discharge:  No. At discharge, do you have transportation home?: Yes,  grandson Do you have the ability to pay for your medications: Yes,  Medicaid/Medicare  Release of information consent forms completed and sent to medical records;  Patient's signature obtained.   Patient to Follow up at:  Follow-up Information     Chucky May, MD Follow up on 02/08/2022.   Specialty: Psychiatry Why: You have an appointment for medication management services on 02/08/22 at 5:30 pm.  This appointment will be Virtual. Contact information: [address] [place] [address] [phone]         [facility]. Call.   Specialty: Hospice and Palliative Medicine Why: Please call personally to schedule an appointment for bereavement/grief counseling services. Contact information: [place] [phone]        Primary Care at [facility]. Call.   Specialty: Family Medicine Why: Please call to schedule an appointment for primary care services with this provider as soon as possible. Contact information: [address] [place] [phone]                Next level of care provider has access to [place] and Suicide Prevention discussed: Yes,  wife     Has patient been referred to the Quitline?: Patient refused referral  Patient has been referred for addiction treatment: Pt. refused referral  Mliss Fritz, Latanya Presser 02/01/2022, 11:01 AM

## 2022-02-01 NOTE — [facility] Suicide Risk Assessment (Addendum)
Suicide Risk Assessment  Discharge Assessment    [facility] Discharge Suicide Risk Assessment   Principal Problem: Severe recurrent major depressive disorder with psychotic features [facility]) Discharge Diagnoses: Principal Problem:   Severe recurrent major depressive disorder with psychotic features (HCC) Active Problems:   Anxiety   Posttraumatic stress disorder   Polysubstance abuse (HCC)   MDD (major depressive disorder), recurrent episode, severe (HCC)  During the patient's hospitalization, patient had extensive initial psychiatric evaluation, and follow-up psychiatric evaluations every day.  Psychiatric diagnoses provided upon initial assessment: MDD with Psychotic Features  Patient's psychiatric medications were adjusted on admission: Stopped his Prozac and Abilify, tapered off his Xanax, and continued his Remeron and Atarax.  During the hospitalization, other adjustments were made to the patient's psychiatric medication regimen: His Remeron dose was increased.  He was started on Zyprexa.  Gradually, patient started adjusting to milieu.   Patient's care was discussed during the interdisciplinary team meeting every day during the hospitalization.  The patient is not having side effects to prescribed psychiatric medication.  The patient reports their target psychiatric symptoms of depression, SI/HI, and AVH responded well to the psychiatric medications, and the patient reports overall benefit other psychiatric hospitalization. Supportive psychotherapy was provided to the patient. The patient also participated in regular group therapy while admitted.   Labs were reviewed with the patient, and abnormal results were discussed with the patient.  The patient denied having suicidal thoughts more than 48 hours prior to discharge.  Patient denies having homicidal thoughts.  Patient denies having auditory hallucinations.  Patient denies any visual hallucinations.  Patient denies having paranoid  thoughts.  The patient is able to verbalize their individual safety plan to this provider.  It is recommended to the patient to continue psychiatric medications as prescribed, after discharge from the hospital.    It is recommended to the patient to follow up with your outpatient psychiatric provider and PCP.  Discussed with the patient, the impact of alcohol, drugs, tobacco have been there overall psychiatric and medical wellbeing, and total abstinence from substance use was recommended the patient.  Total Time spent with patient: 30 minutes  Musculoskeletal: Strength & Muscle Tone: within normal limits Gait & Station: normal Patient leans: N/A  Psychiatric Specialty Exam  Presentation  General Appearance: Appropriate for Environment; Casual; Fairly Groomed (looks older than stated age, has multiple tatoos on his arms)  Eye Contact:Good  Speech:Clear and Coherent; Normal Rate  Speech Volume:Normal  Handedness:Right   Mood and Affect  Mood:-- ("ok")  Duration of Depression Symptoms: No data recorded Affect:Congruent; Constricted   Thought Process  Thought Processes:Coherent; Goal Directed  Descriptions of Associations:Intact  Orientation:Full (Time, Place and Person)  Thought Content:Logical  History of Schizophrenia/Schizoaffective disorder:No data recorded Duration of Psychotic Symptoms:No data recorded Hallucinations:Hallucinations: None  Ideas of Reference:None  Suicidal Thoughts:Suicidal Thoughts: No  Homicidal Thoughts:Homicidal Thoughts: No   Sensorium  Memory:Immediate Good; Recent Good  Judgment:Fair  Insight:Fair   Executive Functions  Concentration:Good  Attention Span:Good  Recall:Good  Fund of Knowledge:Good  Language:Good   Psychomotor Activity  Psychomotor Activity:Psychomotor Activity: Normal AIMS Completed?: Yes AIMS = 0, no cogwheeling or rigidity present.  Assets  Assets:Communication Skills; Resilience   Sleep   Sleep:Sleep: Good  Physical Exam: Physical Exam Vitals and nursing note reviewed.  Constitutional:      General: He is not in acute distress.    Appearance: He is normal weight. He is not ill-appearing or toxic-appearing.     Comments: Looks older than stated  age  HENT:     Head: Normocephalic and atraumatic.  Pulmonary:     Effort: Pulmonary effort is normal.  Musculoskeletal:        General: Normal range of motion.  Neurological:     General: No focal deficit present.     Mental Status: He is alert.   Review of Systems  Respiratory:  Negative for cough and shortness of breath.   Cardiovascular:  Negative for chest pain.  Gastrointestinal:  Negative for abdominal pain, constipation, diarrhea, nausea and vomiting.  Neurological:  Negative for dizziness, weakness and headaches.  Psychiatric/Behavioral:  Negative for depression, hallucinations and suicidal ideas. The patient is not nervous/anxious.   Blood pressure (!) 141/103, pulse 72, temperature 97.7 F (36.5 C), temperature source Oral, resp. rate 17, height 5\' 7"  (1.702 m), weight 61.7 kg, SpO2 99 %. Body mass index is 21.3 kg/m.  Mental Status Per Nursing Assessment::   On Admission:  Suicidal ideation indicated by patient  Demographic Factors:  Male, Low socioeconomic status, and Unemployed  Loss Factors: Financial problems/change in socioeconomic status  Historical Factors: Prior suicide attempts  Risk Reduction Factors:   Sense of responsibility to family and Living with another person, especially a relative  Continued Clinical Symptoms:  None  Cognitive Features That Contribute To Risk:  None    Suicide Risk:  Mild:  He currently endorses no SI but does have a history of suicide attempts.  There are no identifiable plans, no associated intent, mild dysphoria and related symptoms, good self-control (both objective and subjective assessment), few other risk factors, and identifiable protective factors,  including available and accessible social support.   Follow-up Information     Milagros Evener, MD Follow up on 02/08/2022.   Specialty: Psychiatry Why: You have an appointment for medication management services on 02/08/22 at 5:30 pm.  This appointment will be Virtual. Contact information: [address] [place] [address] [phone]         [facility]. Call.   Specialty: Hospice and Palliative Medicine Why: Please call personally to schedule an appointment for bereavement/grief counseling services. Contact information: [address] [place] [address] [phone]        [facility] at [facility]. Call.   Specialty: Family Medicine Why: Please call to schedule an appointment for primary care services with this provider as soon as possible. Contact information: [address], [address] [place] [address] [phone]                Plan Of Care/Follow-up recommendations:  Activity: as tolerated  Diet: heart healthy  Other: -Follow-up with your outpatient psychiatric provider -instructions on appointment date, time, and address (location) are provided to you in discharge paperwork.  -Take your psychiatric medications as prescribed at discharge - instructions are provided to you in the discharge paperwork  -Follow-up with outpatient primary care doctor and other specialists -for management of chronic medical disease, including: Elevated Lipids.  You will need to have periodic testing including: Lipid panel, A1c, CMP, CBC, and weight.  -Testing: Follow-up with outpatient provider for abnormal lab results: Elevated Lipids.  -Recommend abstinence from alcohol, tobacco, and other illicit drug use at discharge.   -If your psychiatric symptoms recur, worsen, or if you have side effects to your psychiatric medications, call your outpatient psychiatric provider, [phone] or go to the nearest emergency  department.  -If suicidal thoughts recur, call your outpatient psychiatric provider, 911, [phone] or go to the nearest emergency  department.   Lauro Franklin, MD 02/01/2022, 8:00 AM Bartholomew Crews, MD, Celene Skeen

## 2022-02-01 NOTE — Discharge Summary (Addendum)
Physician Discharge Summary Note  Patient:  Joseph Curtis is an 61 y.o., male MRN:  [id number] DOB:  [birth year] Patient phone:  [phone] (home)  Patient address:   [place] [address],  Total Time spent with patient: 30 minutes  Date of Admission:  01/21/2022 Date of Discharge: 02/01/2022  Reason for Admission:   Joseph Curtis is 61 year old male who presents voluntary and accompanied by his wife [name]) to [place]. Clinician asked the pt, "what brought you to the hospital?" Pt reports, he's depressed. Pt's wife reports, yesterday she caught the pt with a noose hanging in a tree and he was walking towards the tree but she stopped him. Per wife, she talked to the pt and encouraged him to get help. Pt's wife also reported that pt was having +SI/HI/AVH. Pt was deemed to be a danger to himself and others, and was transferred to this [facility] for treatment and stabilization of his mood.    Pt reports that he had intended to hang himself using the noose on the tree when his wife found him going to the tree and convinced him to stop and go to [facility] instead. Pt reports that he had a severe panic attack when his wife was trying to convince him to go to the ED, and reports having +AVH then. Pt reports hearing non specific voices and seeing various colors. He reports having +HI at that time, to hurt people who have wronged him. Pt reports current stressors as selling his home in [place], and moving to [place] 2 yrs ago to be close to his daughter and grandchildren. He also states that another reason for selling the home was to be able to pay off some medical bills that his wife had. He reports that rent in [place] is unaffordable, their daughter's husband is not receptive of him  Principal Problem: Severe recurrent major depressive disorder with psychotic features [facility]) Discharge Diagnoses: Principal Problem:   Severe recurrent major depressive disorder with psychotic  features ([place]) Active Problems:   Anxiety   Posttraumatic stress disorder   Polysubstance abuse ([address])   MDD (major depressive disorder), recurrent episode, severe ([place])   Past Psychiatric History: MDD, Panic Attacks, multiple suicide attempts (Hanging, cutting wrists), and multiple hospitalizations  Past Medical History:  Past Medical History:  Diagnosis Date   Anxiety    Arthritis    Back pain    chronic   Depression    Fall    from construction site   Headache    Hypertension    Legg-Calve-Perthes disease, bilateral    Neck pain    chronic   Panic attacks    Pneumonia     Past Surgical History:  Procedure Laterality Date   HERNIA REPAIR     JOINT REPLACEMENT     right knee, left shoulder   ORCHIECTOMY Left    Family History: History reviewed. No pertinent family history. Family Psychiatric  History: Reports None Social History:  Social History   Substance and Sexual Activity  Alcohol Use Not Currently     Social History   Substance and Sexual Activity  Drug Use Not Currently   Types: Cocaine, Heroin, Marijuana   Comment: states stopped using drugs few months back.    Social History   Socioeconomic History   Marital status: Married    Spouse name: Not on file   Number of children: Not on file   Years of education: Not on file  Highest education level: Not on file  Occupational History   Not on file  Tobacco Use   Smoking status: Every Day    Packs/day: 0.50    Years: 30.00    Pack years: 15.00    Types: Cigarettes   Smokeless tobacco: Never  Vaping Use   Vaping Use: Never used  Substance and Sexual Activity   Alcohol use: Not Currently   Drug use: Not Currently    Types: Cocaine, Heroin, Marijuana    Comment: states stopped using drugs few months back.   Sexual activity: Not Currently  Other Topics Concern   Not on file  Social History Narrative   Not on file   Social Determinants of Health   Financial Resource Strain: Not on file   Food Insecurity: Not on file  Transportation Needs: Not on file  Physical Activity: Not on file  Stress: Not on file  Social Connections: Not on file    Hospital Course:    During the patient's hospitalization, patient had extensive initial psychiatric evaluation, and follow-up psychiatric evaluations every day.  Psychiatric diagnoses provided upon initial assessment: MDD with Psychotic Features   Patient's psychiatric medications were adjusted on admission: Stopped his Prozac and Abilify, tapered off his Xanax, and continued his Remeron and Atarax.   During the hospitalization, other adjustments were made to the patient's psychiatric medication regimen: His Remeron dose was increased.  He was started on Zyprexa.  Patient's care was discussed during the interdisciplinary team meeting every day during the hospitalization.  The patient is not having side effects to prescribed psychiatric medication.  Gradually, patient started adjusting to milieu. The patient was evaluated each day by a clinical provider to ascertain response to treatment. Improvement was noted by the patient's report of decreasing symptoms, improved sleep and appetite, affect, medication tolerance, behavior, and participation in unit programming.  Patient was asked each day to complete a self inventory noting mood, mental status, pain, new symptoms, anxiety and concerns.   Symptoms were reported as significantly decreased or resolved completely by discharge.  The patient reports that their mood is stable.  The patient denied having suicidal thoughts for more than 48 hours prior to discharge.  Patient denies having homicidal thoughts.  Patient denies having auditory hallucinations.  Patient denies any visual hallucinations or other symptoms of psychosis.  The patient was motivated to continue taking medication with a goal of continued improvement in mental health.   The patient reports their target psychiatric symptoms of  depression, SI/HI, and AVH responded well to the psychiatric medications, and the patient reports overall benefit other psychiatric hospitalization. Supportive psychotherapy was provided to the patient. The patient also participated in regular group therapy while hospitalized. Coping skills, problem solving as well as relaxation therapies were also part of the unit programming.  Labs were reviewed with the patient, and abnormal results were discussed with the patient.  The patient is able to verbalize their individual safety plan to this provider.  # It is recommended to the patient to continue psychiatric medications as prescribed, after discharge from the hospital.    # It is recommended to the patient to follow up with your outpatient psychiatric provider and PCP.  # It was discussed with the patient, the impact of alcohol, drugs, tobacco have been there overall psychiatric and medical wellbeing, and total abstinence from substance use was recommended the patient.ed.  # Prescriptions provided or sent directly to preferred pharmacy at discharge. Patient agreeable to plan. Given opportunity to ask  questions. Appears to feel comfortable with discharge.    # In the event of worsening symptoms, the patient is instructed to call the crisis hotline, 911 and or go to the nearest ED for appropriate evaluation and treatment of symptoms. To follow-up with primary care provider for other medical issues, concerns and or health care needs  # Patient was discharged with his grandson to his daughter house with a plan to follow up as noted below.    On day of discharge patient reports that he is doing okay.  He reports he slept good last night.  He reports his appetite is doing good.  He reports no SI, HI, or AVH.  He reports no [place], Ideas of Reference, or other [place] symptoms.  He reports no issues with his medications.  He reports that his grandson is going to pick him up to take him to his  daughters the house.  He states he will stay there while his wife is still in the hospital being treated for her COPD/pneumonia/COVID.  He states that his plan is to pool their resources when she gets out of the hospital with the goal of them getting a place together.  Discussed with him what to do in the event of a future crisis.  Discussed that he can return to [facility], go to the [facility], go to the nearest ED, or call 911 or 988.   He reported understanding and had no concerns.  He was discharged to his daughter's home home with his grandson.    Physical Findings: AIMS: Facial and Oral Movements Muscles of Facial Expression: None, normal Lips and Perioral Area: None, normal Jaw: None, normal Tongue: None, normal,Extremity Movements Upper (arms, wrists, hands, fingers): None, normal Lower (legs, knees, ankles, toes): None, normal, Trunk Movements Neck, shoulders, hips: None, normal, Overall Severity Severity of abnormal movements (highest score from questions above): None, normal Incapacitation due to abnormal movements: None, normal Patient's awareness of abnormal movements (rate only patient's report): No Awareness, Dental Status Current problems with teeth and/or dentures?: No Does patient usually wear dentures?: No  No cogwheeling or Rigidity present CIWA:  CIWA-Ar Total: 0 COWS:     Musculoskeletal: Strength & Muscle Tone: within normal limits Gait & Station: normal Patient leans: N/A   Psychiatric Specialty Exam:  Presentation  General Appearance: Appropriate for Environment; Casual; Fairly Groomed (looks older than stated age, has multiple tatoos on his arms)  Eye Contact:Good  Speech:Clear and Coherent; Normal Rate  Speech Volume:Normal  Handedness:Right   Mood and Affect  Mood:-- ("ok")  Affect:Congruent; Constricted   Thought Process  Thought Processes:Coherent; Goal Directed  Descriptions of Associations:Intact  Orientation:Full (Time, Place and  Person)  Thought Content:Logical  History of Schizophrenia/Schizoaffective disorder:No data recorded Duration of Psychotic Symptoms:No data recorded Hallucinations:Hallucinations: None  Ideas of Reference:None  Suicidal Thoughts:Suicidal Thoughts: No  Homicidal Thoughts:Homicidal Thoughts: No   Sensorium  Memory:Immediate Good; Recent Good  Judgment:Fair  Insight:Fair   Executive Functions  Concentration:Good  Attention Span:Good  [place] of Knowledge:Good  Language:Good   Psychomotor Activity  Psychomotor Activity:Psychomotor Activity: Normal AIMS Completed?: Yes  Assets  Assets:Communication Skills; Resilience   Sleep  Sleep:Sleep: Good   Physical Exam: Physical Exam Vitals and nursing note reviewed.  Constitutional:      General: He is not in acute distress.    Appearance: He is normal weight. He is not ill-appearing or toxic-appearing.  HENT:     Head: Normocephalic and atraumatic.  Pulmonary:  Effort: Pulmonary effort is normal.  Musculoskeletal:        General: Normal range of motion.  Neurological:     General: No focal deficit present.     Mental Status: He is alert.   Review of Systems  Respiratory:  Negative for cough and shortness of breath.   Cardiovascular:  Negative for chest pain.  Gastrointestinal:  Negative for abdominal pain, constipation, diarrhea, nausea and vomiting.  Neurological:  Negative for dizziness, weakness and headaches.  Psychiatric/Behavioral:  Negative for depression, hallucinations and suicidal ideas. The patient is not nervous/anxious.   Blood pressure (!) 141/103, pulse 72, temperature 97.7 F (36.5 C), temperature source Oral, resp. rate 17, height 5\' 7"  (1.702 m), weight 61.7 kg, SpO2 99 %. Body mass index is 21.3 kg/m.   Social History   Tobacco Use  Smoking Status Every Day   Packs/day: 0.50   Years: 30.00   Pack years: 15.00   Types: Cigarettes  Smokeless Tobacco Never   Tobacco  Cessation:  A prescription for an FDA-approved tobacco cessation medication was offered at discharge and the patient refused   Blood Alcohol level:  Lab Results  Component Value Date   ETH <10 01/20/2022   ETH <10 0000000    Metabolic Disorder Labs:  Lab Results  Component Value Date   HGBA1C 5.1 01/20/2022   MPG 99.67 01/20/2022   No results found for: PROLACTIN Lab Results  Component Value Date   CHOL 199 01/20/2022   TRIG 70 01/20/2022   HDL 45 01/20/2022   CHOLHDL 4.4 01/20/2022   VLDL 14 01/20/2022   LDLCALC 140 (H) 01/20/2022    See Psychiatric Specialty Exam and Suicide Risk Assessment completed by Attending Physician prior to discharge.  Discharge destination:  Home  Is patient on multiple antipsychotic therapies at discharge:  No   Has Patient had three or more failed trials of antipsychotic monotherapy by history:  No  Recommended Plan for Multiple Antipsychotic Therapies: NA  Discharge Instructions     Diet - low sodium heart healthy   Complete by: As directed    Increase activity slowly   Complete by: As directed       Allergies as of 02/01/2022       Reactions   Bee Venom Anaphylaxis, Other (See Comments)   E.R. visit on 06/30/2020 Allergic to honey, also   Penicillins Anaphylaxis, Hives, Rash   Did it involve swelling of the face/tongue/throat, SOB, or low BP? No Did it involve sudden or severe rash/hives, skin peeling, or any reaction on the inside of your mouth or nose? No Did you need to seek medical attention at a hospital or doctor's office? Yes When did it last happen? From childhood  If all above answers are "NO", may proceed with cephalosporin use.   Codeine Itching, Other (See Comments)   "My feet itch, burn--I can't stand it."   Peanut-containing Drug Products    Pt states he is allergic to peanuts        Medication List     STOP taking these medications    FLUoxetine 40 MG capsule Commonly known as: PROZAC    guaiFENesin 600 MG 12 hr tablet Commonly known as: MUCINEX       TAKE these medications      Indication  amLODipine 5 MG tablet Commonly known as: NORVASC Take 1 tablet (5 mg total) by mouth daily.  Indication: High Blood Pressure Disorder   benztropine 0.5 MG tablet Commonly known as: COGENTIN Take  1 tablet (0.5 mg total) by mouth 2 (two) times daily. What changed:  medication strength how much to take  Indication: Extrapyramidal Reaction caused by Medications   docusate sodium 100 MG capsule Commonly known as: COLACE Take 1 capsule (100 mg total) by mouth daily.  Indication: Constipation   EPINEPHrine 0.3 mg/0.3 mL Soaj injection Commonly known as: EPI-PEN Inject 0.3 mLs (0.3 mg total) into the muscle as needed for anaphylaxis. What changed: Another medication with the same name was removed. Continue taking this medication, and follow the directions you see here.  Indication: Life-Threatening Hypersensitivity Reaction   hydrOXYzine 25 MG tablet Commonly known as: ATARAX Take 1 tablet (25 mg total) by mouth every 4 (four) hours as needed for anxiety (Sleep).  Indication: Feeling Anxious   loratadine 10 MG tablet Commonly known as: CLARITIN Take 1 tablet (10 mg total) by mouth daily.  Indication: Allergic Conjunctivitis   mirtazapine 30 MG tablet Commonly known as: REMERON Take 1 tablet (30 mg total) by mouth at bedtime. What changed:  medication strength how much to take  Indication: Major Depressive Disorder   multivitamin with minerals Tabs tablet Take 1 tablet by mouth daily.  Indication: Vitamin Deficiency   OLANZapine 5 MG tablet Commonly known as: ZYPREXA Take 1 tablet (5 mg total) by mouth at bedtime.  Indication: Psychotic Depressive Illness, Psychosis.   sertraline 50 MG tablet Commonly known as: ZOLOFT Take 1 tablet (50 mg total) by mouth daily.  Indication: Major Depressive Disorder        Follow-up Information     Milagros Evener, MD  Follow up on 02/08/2022.   Specialty: Psychiatry Why: You have an appointment for medication management services on 02/08/22 at 5:30 pm.  This appointment will be Virtual. Contact information: [address] [place] [address] [phone]         [facility]. Call.   Specialty: Hospice and Palliative Medicine Why: Please call personally to schedule an appointment for bereavement/grief counseling services. Contact information: [address] [place] [address] [phone]        [facility] at [facility]. Call.   Specialty: Family Medicine Why: Please call to schedule an appointment for primary care services with this provider as soon as possible. Contact information: [address], [address] [place] [address] [phone]                Follow-up recommendations/Comments:   Activity: as tolerated   Diet: heart healthy   Other: -Follow-up with your outpatient psychiatric provider -instructions on appointment date, time, and address (location) are provided to you in discharge paperwork.   -Take your psychiatric medications as prescribed at discharge - instructions are provided to you in the discharge paperwork   -Follow-up with outpatient primary care doctor and other specialists -for management of chronic medical disease, including: Elevated Lipids.  You will need to have periodic testing including: Lipid panel, A1c, CMP, CBC, and weight.   -Testing: Follow-up with outpatient provider for abnormal lab results: Elevated Lipids.   -Recommend abstinence from alcohol, tobacco, and other illicit drug use at discharge.    -If your psychiatric symptoms recur, worsen, or if you have side effects to your psychiatric medications, call your outpatient psychiatric provider, 911, [phone] or go to the nearest emergency department.   -If suicidal thoughts recur, call your outpatient psychiatric provider, 911, 988 or  go to the nearest emergency department.   Signed: Lauro Franklin, MD 02/01/2022, 8:12 AM

## 2022-02-01 NOTE — Care Management Important Message (Signed)
Medicare IM printed and given to Fredirick Lathe, LCSW to give to the patient today.

## 2022-02-01 NOTE — Group Note (Signed)
Recreation Therapy Group Note   Group Topic:Stress Management  Group Date: 02/01/2022 Start Time: 0930 End Time: 0950 Facilitators: Caroll Rancher, [place] Location: [address] Dayroom   Goal Area(s) Addresses:  Patient will identify positive stress management techniques. Patient will identify benefits of using stress management post d/c.   Group Description:  Meditation.  LRT played a meditation that focused on letting go of the past.  Patients were listen to the meditation while focusing on their breathing and allowing the breathing to relax them as much as possible.  After the meditation, LRT explained to patients about accessing stress management tools through Apps, Youtube, yoga, etc.    Affect/Mood: Appropriate   Participation Level: Active   Participation Quality: Independent   Behavior: Attentive    Speech/Thought Process: Focused   Insight: Good   Judgement: Good   Modes of Intervention: Meditation   Patient Response to Interventions:  Attentive   Education Outcome:  Acknowledges education and In group clarification offered    Clinical Observations/Individualized Feedback:  Pt attended and participated in group activity.     Plan: Continue to engage patient in RT group sessions 2-3x/week.   Caroll Rancher, LRT,CTRS 02/01/2022 11:24 AM

## 2022-02-01 NOTE — BH IP Treatment Plan (Signed)
Interdisciplinary Treatment and Diagnostic Plan Update  02/01/2022  VICKI PASQUAL MRN: [id number]  Principal Diagnosis: Severe recurrent major depressive disorder with psychotic features [facility])  Secondary Diagnoses: Principal Problem:   Severe recurrent major depressive disorder with psychotic features (HCC) Active Problems:   Anxiety   Posttraumatic stress disorder   Polysubstance abuse (HCC)   MDD (major depressive disorder), recurrent episode, severe (HCC)   Current Medications:  Current Facility-Administered Medications  Medication Dose Route Frequency Provider Last Rate Last Admin   acetaminophen (TYLENOL) tablet 650 mg  650 mg Oral Q6H PRN Ardis Hughs, NP   650 mg at 01/31/22 0904   alum & mag hydroxide-simeth (MAALOX/MYLANTA) 200-200-20 MG/5ML suspension 30 mL  30 mL Oral Q4H PRN Ardis Hughs, NP   30 mL at 01/27/22 2140   amLODipine (NORVASC) tablet 5 mg  5 mg Oral Daily Ardis Hughs, NP   5 mg at 02/01/22 4098   benztropine (COGENTIN) tablet 0.5 mg  0.5 mg Oral BID Starleen Blue, NP   0.5 mg at 02/01/22 0818   docusate sodium (COLACE) capsule 100 mg  100 mg Oral Daily Mason Jim, Amy E, MD   100 mg at 02/01/22 0819   hydrOXYzine (ATARAX) tablet 25 mg  25 mg Oral Q4H PRN Armandina Stammer I, NP   25 mg at 02/01/22 1302   ibuprofen (ADVIL) tablet 400 mg  400 mg Oral Q6H PRN Armandina Stammer I, NP   400 mg at 02/01/22 0818   loratadine (CLARITIN) tablet 10 mg  10 mg Oral Daily Ardis Hughs, NP   10 mg at 02/01/22 0819   magnesium hydroxide (MILK OF MAGNESIA) suspension 30 mL  30 mL Oral Daily PRN Ardis Hughs, NP   30 mL at 01/29/22 0736   menthol-cetylpyridinium (CEPACOL) lozenge 3 mg  1 lozenge Oral PRN Phineas Inches, MD   3 mg at 01/24/22 1191   methocarbamol (ROBAXIN) tablet 500 mg  500 mg Oral Q6H PRN Starleen Blue, NP   500 mg at 01/29/22 0737   mirtazapine (REMERON) tablet 30 mg  30 mg Oral QHS Armandina Stammer I, NP   30 mg at 01/31/22 2128    multivitamin with minerals tablet 1 tablet  1 tablet Oral Daily Starleen Blue, NP   1 tablet at 02/01/22 0818   OLANZapine (ZYPREXA) tablet 5 mg  5 mg Oral QHS Nwoko, Nicole Kindred I, NP   5 mg at 01/31/22 2128   OLANZapine zydis (ZYPREXA) disintegrating tablet 5 mg  5 mg Oral Q8H PRN Starleen Blue, NP   5 mg at 01/29/22 2103   And   ziprasidone (GEODON) injection 20 mg  20 mg Intramuscular PRN Starleen Blue, NP       ondansetron (ZOFRAN-ODT) disintegrating tablet 4 mg  4 mg Oral Q6H PRN Starleen Blue, NP   4 mg at 01/31/22 2128   polyethylene glycol (MIRALAX / GLYCOLAX) packet 17 g  17 g Oral Daily Starleen Blue, NP   17 g at 02/01/22 0819   sertraline (ZOLOFT) tablet 50 mg  50 mg Oral Daily Starleen Blue, NP   50 mg at 02/01/22 4782   thiamine tablet 100 mg  100 mg Oral Daily Nkwenti, Tyler Aas, NP   100 mg at 02/01/22 0818   traZODone (DESYREL) tablet 100 mg  100 mg Oral QHS PRN Starleen Blue, NP   100 mg at 01/31/22 2128   PTA Medications: Medications Prior to Admission  Medication Sig Dispense Refill Last Dose   benztropine (  COGENTIN) 1 MG tablet Take 1 tablet (1 mg total) by mouth 2 (two) times daily. (Patient not taking: Reported on 01/20/2022) 30 tablet 0    EPINEPHrine 0.3 mg/0.3 mL IJ SOAJ injection Inject 0.3 mLs (0.3 mg total) into the muscle as needed for anaphylaxis. (Patient not taking: Reported on 01/20/2022) 1 each 1    EPINEPHrine 0.3 mg/0.3 mL IJ SOAJ injection Inject 0.3 mLs (0.3 mg total) into the muscle as needed for anaphylaxis. 2 each 2    FLUoxetine (PROZAC) 40 MG capsule Take 40 mg by mouth daily.      guaiFENesin (MUCINEX) 600 MG 12 hr tablet Take 600 mg by mouth every 12 (twelve) hours.      mirtazapine (REMERON) 15 MG tablet Take 15 mg by mouth at bedtime.       Patient Stressors: Financial difficulties   Health problems   Substance abuse   Traumatic event    Patient Strengths: Capable of independent living  General fund of knowledge  Supportive family/friends    Treatment Modalities: Medication Management, Group therapy, Case management,  1 to 1 session with clinician, Psychoeducation, Recreational therapy.   Physician Treatment Plan for Primary Diagnosis: Severe recurrent major depressive disorder with psychotic features (HCC) Long Term Goal(s): Improvement in symptoms so as ready for discharge   Short Term Goals: Ability to identify changes in lifestyle to reduce recurrence of condition will improve Ability to verbalize feelings will improve Ability to disclose and discuss suicidal ideas Ability to demonstrate self-control will improve Ability to maintain clinical measurements within normal limits will improve Compliance with prescribed medications will improve Ability to identify triggers associated with substance abuse/mental health issues will improve  Medication Management: Evaluate patient's response, side effects, and tolerance of medication regimen.  Therapeutic Interventions: 1 to 1 sessions, Unit Group sessions and Medication administration.  Evaluation of Outcomes: Adequate for Discharge  Physician Treatment Plan for Secondary Diagnosis: Principal Problem:   Severe recurrent major depressive disorder with psychotic features (HCC) Active Problems:   Anxiety   Posttraumatic stress disorder   Polysubstance abuse (HCC)   MDD (major depressive disorder), recurrent episode, severe (HCC)  Long Term Goal(s): Improvement in symptoms so as ready for discharge   Short Term Goals: Ability to identify changes in lifestyle to reduce recurrence of condition will improve Ability to verbalize feelings will improve Ability to disclose and discuss suicidal ideas Ability to demonstrate self-control will improve Ability to maintain clinical measurements within normal limits will improve Compliance with prescribed medications will improve Ability to identify triggers associated with substance abuse/mental health issues will improve      Medication Management: Evaluate patient's response, side effects, and tolerance of medication regimen.  Therapeutic Interventions: 1 to 1 sessions, Unit Group sessions and Medication administration.  Evaluation of Outcomes: Adequate for Discharge   RN Treatment Plan for Primary Diagnosis: Severe recurrent major depressive disorder with psychotic features (HCC) Long Term Goal(s): Knowledge of disease and therapeutic regimen to maintain health will improve  Short Term Goals: Ability to participate in decision making will improve, Ability to verbalize feelings will improve, and Ability to identify and develop effective coping behaviors will improve  Medication Management: RN will administer medications as ordered by provider, will assess and evaluate patient's response and provide education to patient for prescribed medication. RN will report any adverse and/or side effects to prescribing provider.  Therapeutic Interventions: 1 on 1 counseling sessions, Psychoeducation, Medication administration, Evaluate responses to treatment, Monitor vital signs and CBGs as ordered, Perform/monitor CIWA,  COWS, AIMS and Fall Risk screenings as ordered, Perform wound care treatments as ordered.  Evaluation of Outcomes: Adequate for Discharge   LCSW Treatment Plan for Primary Diagnosis: Severe recurrent major depressive disorder with psychotic features (HCC) Long Term Goal(s): Safe transition to appropriate next level of care at discharge, Engage patient in therapeutic group addressing interpersonal concerns.  Short Term Goals: Engage patient in aftercare planning with referrals and resources, Increase social support, and Increase ability to appropriately verbalize feelings  Therapeutic Interventions: Assess for all discharge needs, 1 to 1 time with Social worker, Explore available resources and support systems, Assess for adequacy in community support network, Educate family and significant other(s) on  suicide prevention, Complete Psychosocial Assessment, Interpersonal group therapy.  Evaluation of Outcomes: Adequate for Discharge   Progress in Treatment: Attending groups: Yes. Participating in groups: Yes. Taking medication as prescribed: Yes. Toleration medication: Yes. Family/Significant other contact made: Yes, individual(s) contacted:  wife Patient understands diagnosis: Yes. Discussing patient identified problems/goals with staff: Yes. Medical problems stabilized or resolved: Yes. Denies suicidal/homicidal ideation: Yes. Issues/concerns per patient self-inventory: No. Other: None  New problem(s) identified: No, Describe:  None  New Short Term/Long Term Goal(s):medication stabilization, elimination of SI thoughts, development of comprehensive mental wellness plan.   Patient Goals:  "To get the voices out of my head and get back on my medications"   Discharge Plan or Barriers: Pt will f/u for medication management with R. Evelene Croon.   Reason for Continuation of Hospitalization: Medication stabilization  Estimated Length of Stay: 3-5 days   Scribe for Treatment Team: Chrys Racer 02/01/2022 1:25 PM

## 2022-02-01 NOTE — Progress Notes (Signed)
RN met with pt and reviewed pt's discharge instructions.  Pt verbalized understanding of discharge instructions and pt did not have any questions. RN reviewed and provided pt with a copy of SRA, AVS and Transition Record.  RN returned pt's belongings to pt.  Pt denied SI/HI/AVH and voiced no concerns.  Pt was appreciative of the care pt received at [facility].  Patient discharged to the lobby without incident. 

## 2022-02-03 ENCOUNTER — Emergency Department (HOSPITAL_COMMUNITY)
Admission: EM | Admit: 2022-02-03 | Discharge: 2022-02-03 | Disposition: A | Discharge status: Home / Self Care | Payer: Medicare Other | Attending: Emergency Medicine | Admitting: Emergency Medicine

## 2022-02-03 ENCOUNTER — Other Ambulatory Visit: Payer: Self-pay

## 2022-02-03 DIAGNOSIS — F410 Panic disorder [episodic paroxysmal anxiety] without agoraphobia: Secondary | ICD-10-CM | POA: Insufficient documentation

## 2022-02-03 DIAGNOSIS — R569 Unspecified convulsions: Secondary | ICD-10-CM | POA: Insufficient documentation

## 2022-02-03 DIAGNOSIS — Z79899 Other long term (current) drug therapy: Secondary | ICD-10-CM | POA: Insufficient documentation

## 2022-02-03 DIAGNOSIS — Z8616 Personal history of COVID-19: Secondary | ICD-10-CM | POA: Diagnosis not present

## 2022-02-03 DIAGNOSIS — R519 Headache, unspecified: Secondary | ICD-10-CM | POA: Diagnosis not present

## 2022-02-03 DIAGNOSIS — I10 Essential (primary) hypertension: Secondary | ICD-10-CM | POA: Diagnosis not present

## 2022-02-03 LAB — COMPREHENSIVE METABOLIC PANEL
ALT: 33 U/L (ref 0–44)
AST: 39 U/L (ref 15–41)
Albumin: 4.4 g/dL (ref 3.5–5.0)
Alkaline Phosphatase: 63 U/L (ref 38–126)
Anion gap: 9 (ref 5–15)
BUN: 19 mg/dL (ref 6–20)
CO2: 23 mmol/L (ref 22–32)
Calcium: 9 mg/dL (ref 8.9–10.3)
Chloride: 102 mmol/L (ref 98–111)
Creatinine, Ser: 0.91 mg/dL (ref 0.61–1.24)
GFR, Estimated: >60 mL/min (ref >60)
Glucose, Bld: 76 mg/dL (ref 70–99)
Potassium: 5 mmol/L (ref 3.5–5.1)
Sodium: 134 mmol/L — ABNORMAL LOW (ref 135–145)
Total Bilirubin: 1.1 mg/dL (ref 0.3–1.2)
Total Protein: 7.9 g/dL (ref 6.5–8.1)

## 2022-02-03 LAB — CBC WITH DIFFERENTIAL/PLATELET
Abs Immature Granulocytes: 0.05 10*3/uL (ref 0.00–0.07)
Basophils Absolute: 0.1 10*3/uL (ref 0.0–0.1)
Basophils Relative: 1 %
Eosinophils Absolute: 0.6 10*3/uL — ABNORMAL HIGH (ref 0.0–0.5)
Eosinophils Relative: 7 %
HCT: 45.5 % (ref 39.0–52.0)
Hemoglobin: 15.3 g/dL (ref 13.0–17.0)
Immature Granulocytes: 1 %
Lymphocytes Relative: 36 %
Lymphs Abs: 3.2 10*3/uL (ref 0.7–4.0)
MCH: 31.4 pg (ref 26.0–34.0)
MCHC: 33.6 g/dL (ref 30.0–36.0)
MCV: 93.4 fL (ref 80.0–100.0)
Monocytes Absolute: 1.2 10*3/uL — ABNORMAL HIGH (ref 0.1–1.0)
Monocytes Relative: 13 %
Neutro Abs: 3.6 10*3/uL (ref 1.7–7.7)
Neutrophils Relative %: 42 %
Platelets: 367 10*3/uL (ref 150–400)
RBC: 4.87 MIL/uL (ref 4.22–5.81)
RDW: 13.5 % (ref 11.5–15.5)
WBC: 8.7 10*3/uL (ref 4.0–10.5)
nRBC: 0 % (ref 0.0–0.2)

## 2022-02-03 LAB — CBG MONITORING, ED: Glucose-Capillary: 83 mg/dL (ref 70–99)

## 2022-02-03 NOTE — ED Triage Notes (Addendum)
Patient brought to ED by ICU staff, patient visiting his wife in ICU, patient "fell to floor and began having seizure like activity". Reports patient hit head. Patient alert/oriented in ED, denies seizure activity. Reports he has history of severe anxiety attacks and believes that's what happened in ICU. Denies pain in triage. C collar applied to patient upon arrival to ED.

## 2022-02-03 NOTE — ED Provider Notes (Signed)
[place] DEPT Provider Note   CSN: [id number] Arrival date & time: 02/03/22  [id number]     History  Chief Complaint  Patient presents with   Seizures    Joseph Curtis is a 61 y.o. male with history of severe major depressive disorder with psychotic features, panic attacks anxiety, and hypertension who presents to the emergency department with "seizure-like activity".  Patient was visiting his wife in the ICU as she is recovering from COVID-pneumonia, and he believes that he had a panic attack.  It was witnessed that he fell to the ground and began shaking.  He states this felt like his normal panic attacks.  When he has this he does not maintain awareness, and is slightly confused afterwards.  He states that he has been worked up for seizures several times in the past, and the testing is all been negative.  He reports that he has been living in his car for the past year, and has been increasingly stressed after recent hospitalization and his wife's declining health.  He is complaining of a headache, but has no other complaints at this time.  He reports smoking cigarettes.  No alcohol use.  Did use cocaine yesterday.   Seizures    Past Medical History:  Diagnosis Date   Anxiety    Arthritis    Back pain    chronic   Depression    Fall    from construction site   Headache    Hypertension    Legg-Calve-Perthes disease, bilateral    Neck pain    chronic   Panic attacks    Pneumonia      Home Medications Prior to Admission medications   Medication Sig Start Date End Date Taking? Authorizing Provider  amLODipine (NORVASC) 5 MG tablet Take 1 tablet (5 mg total) by mouth daily. 02/01/22 03/03/22 Yes Pashayan, Redgie Grayer, MD  benztropine (COGENTIN) 0.5 MG tablet Take 1 tablet (0.5 mg total) by mouth 2 (two) times daily. 02/01/22 03/03/22 Yes Pashayan, Redgie Grayer, MD  docusate sodium (COLACE) 100 MG capsule Take 1 capsule (100 mg total) by mouth daily.  02/01/22  Yes Pashayan, Redgie Grayer, MD  EPINEPHrine 0.3 mg/0.3 mL IJ SOAJ injection Inject 0.3 mLs (0.3 mg total) into the muscle as needed for anaphylaxis. 06/30/20  Yes Gareth Morgan, MD  gabapentin (NEURONTIN) 600 MG tablet Take 1,200 mg by mouth 3 (three) times daily. 01/25/22  Yes [provider]  hydrOXYzine (ATARAX) 25 MG tablet Take 1 tablet (25 mg total) by mouth every 4 (four) hours as needed for anxiety (Sleep). 02/01/22  Yes Pashayan, Redgie Grayer, MD  loratadine (CLARITIN) 10 MG tablet Take 1 tablet (10 mg total) by mouth daily. 02/01/22  Yes Pashayan, Redgie Grayer, MD  mirtazapine (REMERON) 30 MG tablet Take 1 tablet (30 mg total) by mouth at bedtime. 02/01/22 03/03/22 Yes Pashayan, Redgie Grayer, MD  Multiple Vitamin (MULTIVITAMIN WITH MINERALS) TABS tablet Take 1 tablet by mouth daily. 02/01/22  Yes Pashayan, Redgie Grayer, MD  OLANZapine (ZYPREXA) 5 MG tablet Take 1 tablet (5 mg total) by mouth at bedtime. 02/01/22 03/03/22 Yes Pashayan, Redgie Grayer, MD  sertraline (ZOLOFT) 50 MG tablet Take 1 tablet (50 mg total) by mouth daily. 02/01/22 03/03/22 Yes Pashayan, Redgie Grayer, MD      Allergies    Bee venom, Penicillins, Codeine, and Peanut-containing drug products    Review of Systems   Review of Systems  Eyes:  Negative for visual disturbance.  Respiratory:  Negative for shortness of breath.   Cardiovascular:  Negative for chest pain.  Gastrointestinal:  Negative for nausea and vomiting.  Musculoskeletal:  Negative for back pain, neck pain and neck stiffness.  Neurological:  Positive for headaches. Negative for dizziness, tremors, speech difficulty, weakness and light-headedness.  Psychiatric/Behavioral:  The patient is nervous/anxious.   All other systems reviewed and are negative.  Physical Exam Updated Vital Signs BP (!) 153/79    Pulse 63    Temp 98.9 F (37.2 C) (Oral)    Resp 18    Ht 5\' 7"  (1.702 m)    Wt 61.2 kg    SpO2 99%    BMI 21.14 kg/m  Physical Exam Vitals and  nursing note reviewed.  Constitutional:      Appearance: Normal appearance.  HENT:     Head: Normocephalic and atraumatic.  Eyes:     Conjunctiva/sclera: Conjunctivae normal.     Comments: Pupils are equal/round/reactive to light, bilaterally dilated to 7 mm  Cardiovascular:     Rate and Rhythm: Normal rate and regular rhythm.  Pulmonary:     Effort: Pulmonary effort is normal. No respiratory distress.     Breath sounds: Normal breath sounds.  Abdominal:     General: There is no distension.     Palpations: Abdomen is soft.     Tenderness: There is no abdominal tenderness.  Musculoskeletal:     Comments: No midline spinal tenderness, step-offs or crepitus.  Skin:    General: Skin is warm and dry.  Neurological:     General: No focal deficit present.     Mental Status: He is alert.     Comments: Neuro: Speech is clear, able to follow commands. CN III-XII intact grossly intact. PERRLA. EOMI. Sensation intact throughout. Str 5/5 all extremities.   Psychiatric:     Comments: Endorses significant stress.  Tearful when discussing today's events. A&O x 4. Not postictal.     ED Results / Procedures / Treatments   Labs (all labs ordered are listed, but only abnormal results are displayed) Labs Reviewed  COMPREHENSIVE METABOLIC PANEL - Abnormal; Notable for the following components:      Result Value   Sodium 134 (*)    All other components within normal limits  CBC WITH DIFFERENTIAL/PLATELET - Abnormal; Notable for the following components:   Monocytes Absolute 1.2 (*)    Eosinophils Absolute 0.6 (*)    All other components within normal limits  CBG MONITORING, ED    EKG EKG Interpretation  Date/Time:  Sunday February 03 2022 08:53:00 EST Ventricular Rate:  69 PR Interval:  141 QRS Duration: 95 QT Interval:  427 QTC Calculation: 458 R Axis:   57 Text Interpretation: Sinus rhythm Confirmed by Dene Gentry [phone]) on 02/03/2022 9:43:37 AM  Radiology No results  found.  Procedures Procedures    Medications Ordered in ED Medications - No data to display  ED Course/ Medical Decision Making/ A&P                           Medical Decision Making Amount and/or Complexity of Data Reviewed Labs: ordered.   This patient is a 61 year old male who presents to the ED for concern of seizure-like activity.   Differential diagnoses prior to evaluation: The emergent differential diagnosis includes, but is not limited to, seizure, panic attack, syncope, hypoglycemia, electrolyte disturbance, polypharmacy, drug abuse or withdrawal.  Past Medical History / Co-morbidities: Severe depression with psychotic features,  anxiety, panic attacks, substance use, hypertension  Physical Exam: Physical exam performed. The pertinent findings include: Patient is afebrile, not tachycardic, in no acute distress.  He is tearful describing today's events but is alert and oriented.  Not postictal.  Normal neurologic exam as above.  Lab Tests/Imaging studies: I Ordered, and personally interpreted labs/imaging including CBC, CMP, CBG, urine drug screen.  The pertinent results include: No leukocytosis, hemoglobin normal, electrolytes within normal limits, no hypoglycemia.  UDS pending. Patient not complaining of any head, neck, or back pain.  No midline spinal tenderness, step-offs or crepitus.  Head is atraumatic.  Will defer imaging at this time.   Disposition: After consideration of the diagnostic results and the patients response to treatment, I feel that patient is not requiring mission or inpatient treatment for his symptoms.  He states that his symptoms felt exactly like his previous anxiety attacks.  He reports "having been worked up for seizure several times", and states the results were negative each time.  He has had no recurrence of symptoms after observation in the emergency department.  He was able to ambulate around the department with normal vital signs.  I do not  believe he is requiring further seizure workup at this time. He is eager to be discharged and return back to his wife in the ICU.  Discussed reasons to return to the emergency department, and the patient is agreeable to the plan.  Final Clinical Impression(s) / ED Diagnoses Final diagnoses:  Seizure-like activity ([place])  Panic attack    Rx / DC Orders ED Discharge Orders     None      Portions of this report may have been transcribed using voice recognition software. Every effort was made to ensure accuracy; however, inadvertent computerized transcription errors may be present.    Estill Cotta 02/03/22 1055    Valarie Merino, MD 02/03/22 1443

## 2022-02-03 NOTE — Discharge Instructions (Signed)
You were seen in emergency department today for seizure-like activity.  As we discussed your lab work is all looked reassuring today.  I do believe that your symptoms are related to a panic attack.  I wish you and your wife the best of luck and fast recovery.   Continue to monitor how you are doing and return to the emergency department for any new or worsening symptoms.

## 2022-02-03 NOTE — ED Notes (Signed)
Pt. Ambulated with no assist. Pt. Ambulated from the bed to the restroom. Pt. O2 was at 100% while waking @ 72 pulse. Nurse aware.

## 2022-02-20 ENCOUNTER — Telehealth (HOSPITAL_COMMUNITY): Payer: Self-pay

## 2022-02-20 NOTE — BH Assessment (Signed)
Care Management - Follow Up [facility] Discharges  ? ?Patient has been placed in an inpatient psychiatric hospital [facility]) on 01-21-2022. ?

## 2023-08-27 IMAGING — CR DG CHEST 2V
3 series · 3 of 3 positions shown · non-contrast
Comparison: 06/15/2015

CLINICAL DATA: Cough

EXAM:
CHEST - 2 VIEW

[w chest lat (1 of 2)]
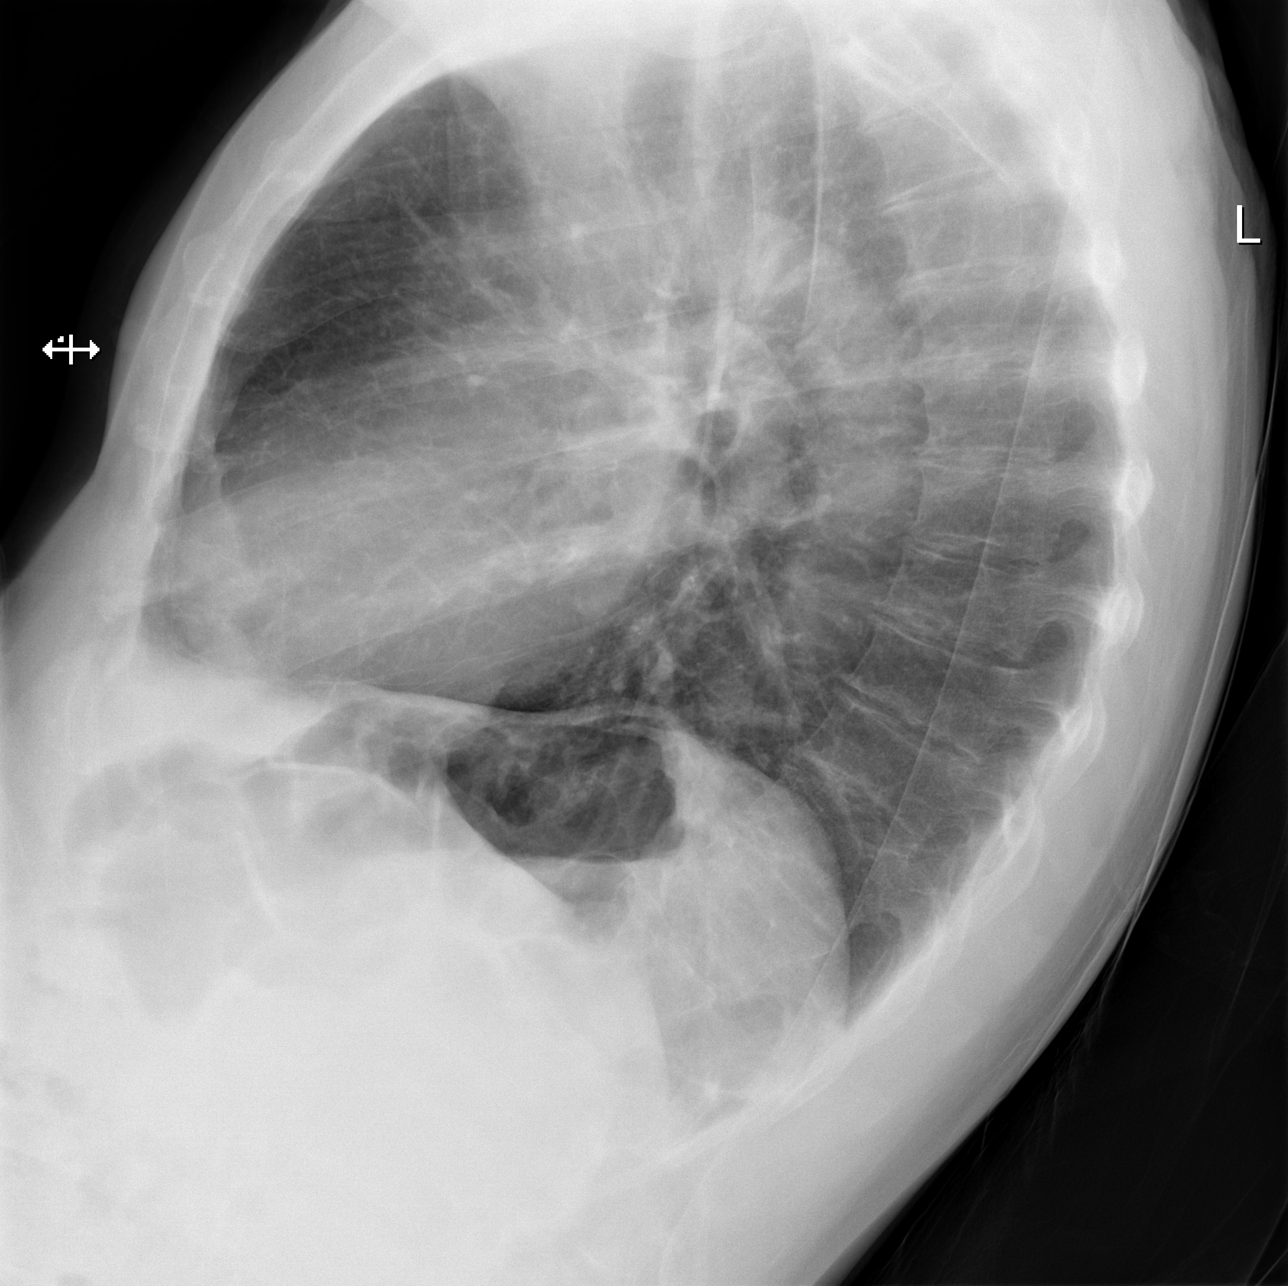

[w chest lat (2 of 2)]
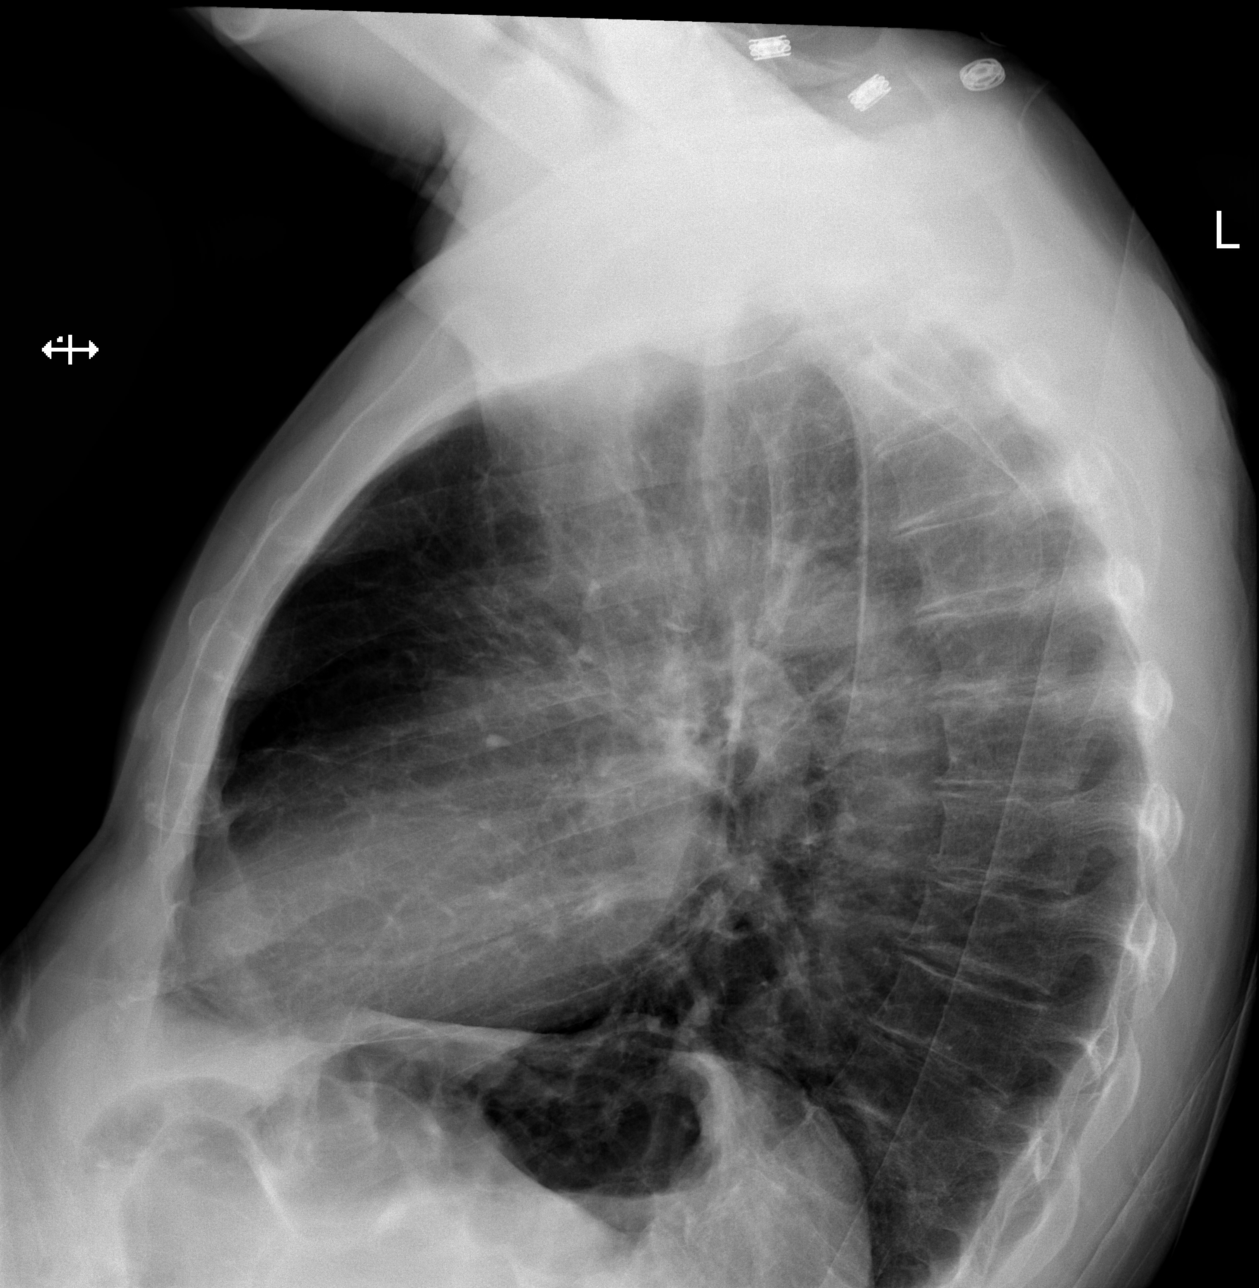

[x chest ap]
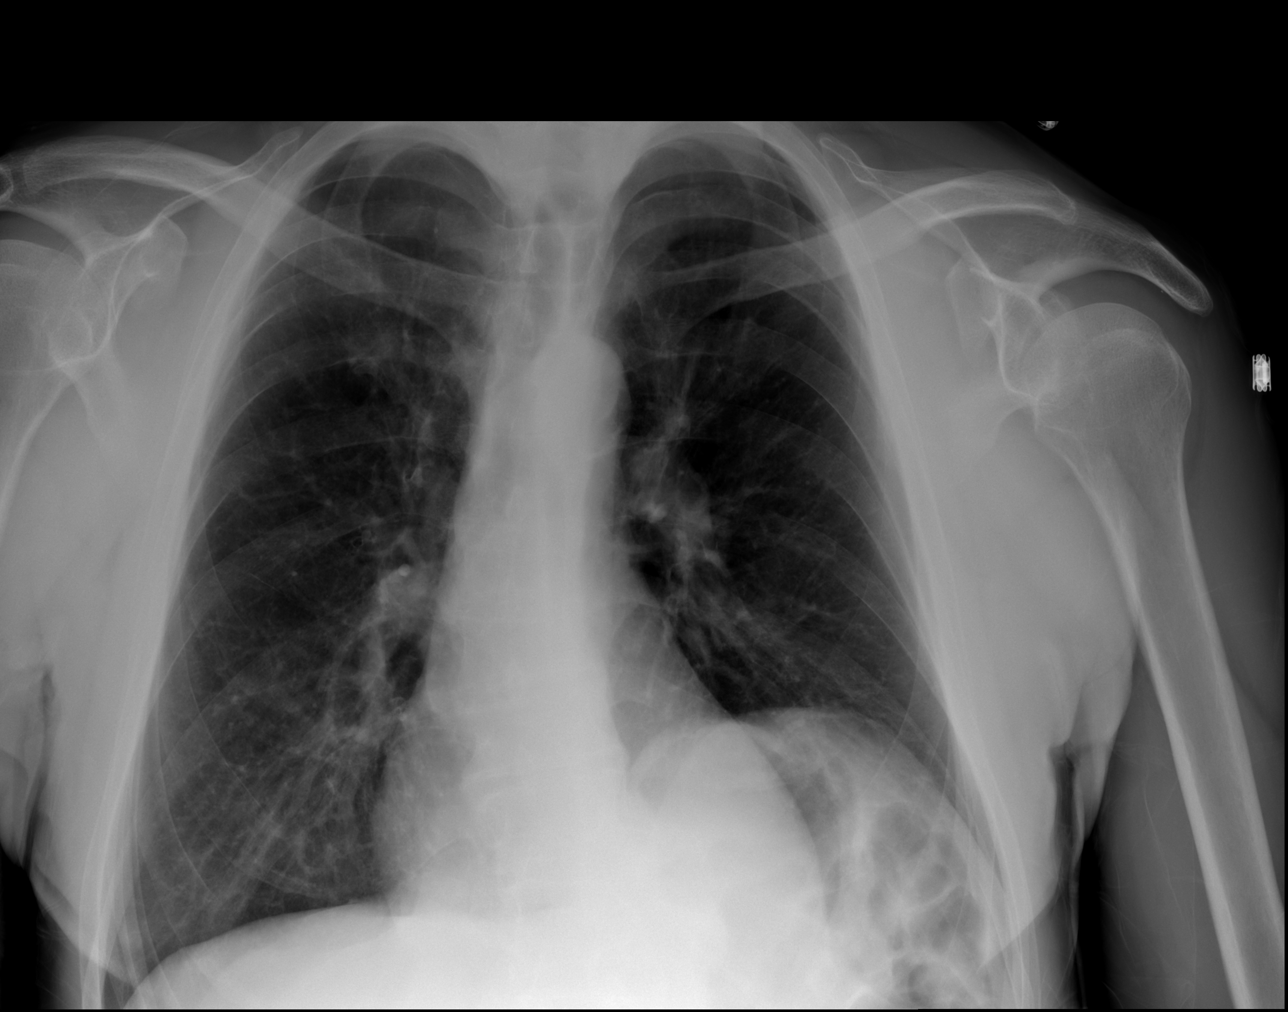

[3 of 3 positions shown; findings below may reference images not displayed]

FINDINGS: Progressive elevation of the left hemidiaphragm. Lungs are clear. No
pneumothorax or pleural effusion. Cardiac size within normal limits.
Pulmonary vascularity is normal. No acute bone abnormality.
IMPRESSION: Progressive elevation of the left hemidiaphragm. No superimposed
focal pulmonary infiltrate.

## 2023-09-01 ENCOUNTER — Ambulatory Visit (HOSPITAL_COMMUNITY)
Admission: EM | Admit: 2023-09-01 | Discharge: 2023-09-01 | Disposition: A | Discharge status: Home / Self Care | Payer: 59 | Attending: Psychiatry | Admitting: Psychiatry

## 2023-09-01 DIAGNOSIS — F1410 Cocaine abuse, uncomplicated: Secondary | ICD-10-CM | POA: Insufficient documentation

## 2023-09-01 DIAGNOSIS — Z5900 Homelessness unspecified: Secondary | ICD-10-CM | POA: Diagnosis not present

## 2023-09-01 DIAGNOSIS — F339 Major depressive disorder, recurrent, unspecified: Secondary | ICD-10-CM | POA: Insufficient documentation

## 2023-09-01 DIAGNOSIS — R45851 Suicidal ideations: Secondary | ICD-10-CM | POA: Diagnosis not present

## 2023-09-01 DIAGNOSIS — F329 Major depressive disorder, single episode, unspecified: Secondary | ICD-10-CM | POA: Diagnosis present

## 2023-09-01 MED ORDER — MAGNESIUM HYDROXIDE 400 MG/5ML PO SUSP: 30.0000 mL | Freq: Every day | ORAL | Status: DC | PRN

## 2023-09-01 MED ORDER — ACETAMINOPHEN 325 MG PO TABS: 650.0000 mg | ORAL_TABLET | Freq: Four times a day (QID) | ORAL | Status: DC | PRN

## 2023-09-01 MED ORDER — ZIPRASIDONE MESYLATE 20 MG IM SOLR: 20.0000 mg | INTRAMUSCULAR | Status: DC | PRN

## 2023-09-01 MED ORDER — ALUM & MAG HYDROXIDE-SIMETH 200-200-20 MG/5ML PO SUSP: 30.0000 mL | ORAL | Status: DC | PRN

## 2023-09-01 MED ORDER — OLANZAPINE 10 MG PO TBDP: 10.0000 mg | ORAL_TABLET | Freq: Three times a day (TID) | ORAL | Status: DC | PRN

## 2023-09-01 NOTE — ED Notes (Signed)
Pt was admitted to [facility] and be re assess in morning pt was about to get his blood work done by [facility] and started to have complaints of making sure he wants his medications before he consent to blood work and UDS he was the referred to NP and he stated to NP that he did not want to stay if he was not going to get Xanax pt was d/c AMA
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
## 2023-09-01 NOTE — Progress Notes (Signed)
   09/01/23 1925  [facility] Triage Screening (Walk-ins at [facility] only)  What Is the Reason for Your Visit/Call Today? Pt presents to [facility] voluntarily, unaccompanied with complaint of worsening depression and SI,with a plan to hang himself. Pt reports that he has struggled with depression for many years and more recently struggled due to the loss of his wife. Pt also reports that he is experiencing homelessness at this time. Pt stated diagnosis of PTSD, MDD and is linked to Dr. Evelene Croon for medication management. Pt reports he has not had medications for about 3 weeks or more. Pt states that he is prescribed Prozac, Remeron and Welbutrin. Pt has also used cocaine within the last 24 hours (2-3 hits). Pt currently denies HI,AVH.  How Long Has This Been Causing You Problems? 1 wk - 1 month  Have You Recently Had Any Thoughts About Hurting Yourself? Yes  How long ago did you have thoughts about hurting yourself? currently  Are You Planning to Commit Suicide/Harm Yourself At This time? Yes  Have you Recently Had Thoughts About Hurting Someone Karolee Ohs? No  Are You Planning To Harm Someone At This Time? No  Are you currently experiencing any auditory, visual or other hallucinations? No  Have You Used Any Alcohol or Drugs in the Past 24 Hours? Yes  How long ago did you use Drugs or Alcohol? last night  What Did You Use and How Much? smoking crack cocaine  Do you have any current medical co-morbidities that require immediate attention? No  Clinician description of patient physical appearance/behavior: pt is cooperative, calm, casually dressed  What Do You Feel Would Help You the Most Today? Treatment for Depression or other mood problem;Medication(s);Housing Assistance  If access to [facility] Urgent Care was not available, would you have sought care in the Emergency Department? Yes  Determination of Need Emergent (2 hours)  Options For Referral Other: Comment;Outpatient Therapy;Facility-Based Crisis;Inpatient Hospitalization;BH  Urgent Care

## 2023-09-01 NOTE — ED Provider Notes (Signed)
[facility] Continuous Assessment Admission H&P  Date: 09/02/23 Patient Name: Joseph Curtis MRN: [id number] Chief Complaint: suicidal attempts  Diagnoses:  Final diagnoses:  Suicidal ideation  Recurrent major depressive disorder, remission status unspecified (HCC)  Homelessness unspecified  Cocaine abuse (HCC)    HPI: Joseph Curtis, 62 y/o male with a history of PTSD, MDD, and panic attacks.  Presented to [facility] voluntarily.  Per the patient he is suicidal with attempts to hang himself.  According to the patient he is currently homeless he lost his wife a couple years ago and according to him he had sold his house and moved to [place] but he could not get an apartment because he did make 3 times the rent.  Patient is currently seeing a psychiatrist and is prescribed medication however the patient stated he has not taken the medicine in over 3 to 4 weeks.  Patient is currently on probation for drug possession.  According to the patient he used cocaine 1 time when asked if he used it on a regular patient stated no.   Face-to-face observation of patient, patient is alert and oriented x 4, speech is clear, maintained minimal eye contact, pt has a hoodie jacket over his head and held his head down when talking.  Patient is observed sitting in chair a little bit unkept, patient does appear nervous.  Patient endorsed suicidal ideation that he had plans to hang himself with a cable.  Patient denies HI, AVH or paranoia.  According to the patient he only gets 1 to 2 to 3 hours of sleep.  Patient is currently homeless,  according to the patient he is looking for a place to stay for 10 day or more.  According to patient he is on disability.  Patient denies access to guns or any other form of weapons.  At this time patient does not seem to be influenced by external stimuli.   Nursing staff informed writer that paitent stated to them that if he is not going to get certain medication he was not staying.   Writer spoke with patient and asked him what medication was he asking for.  Pt state I'm on xanax and mood stabilizer,  per the patient if he is not going to get xanax he is not staying.  A review of patient PDMP show that patient was prescribed xanax 1 mg a  total of 120 xanax on 08/22/23.  Pt is already out of the xanax,  this does look like patient is medication seeking and is misusing benzo medication.  Pt state he was leaving because I need alprazolam and if he didn't get it no need for him to stay.      Recommend inpatient observation.  Total Time spent with patient: 30 minutes  Musculoskeletal  Strength & Muscle Tone: within normal limits Gait & Station: normal Patient leans: N/A  Psychiatric Specialty Exam  Presentation General Appearance:  Disheveled  Eye Contact: Good  Speech: Clear and Coherent  Speech Volume: Normal  Handedness: Right   Mood and Affect  Mood: Anxious; Depressed  Affect: Congruent   Thought Process  Thought Processes: Coherent  Descriptions of Associations:Circumstantial  Orientation:Full (Time, Place and Person)  Thought Content:WDL    Hallucinations:Hallucinations: None  Ideas of Reference:None  Suicidal Thoughts:Suicidal Thoughts: Yes, Active SI Active Intent and/or Plan: With Intent; With Plan  Homicidal Thoughts:Homicidal Thoughts: No   Sensorium  Memory: Immediate Fair  Judgment: Poor  Insight: Fair   Art therapist  Concentration: Fair  Attention Span: Fair  Recall: Jennelle Human of Knowledge: Fair  Language: Good   Psychomotor Activity  Psychomotor Activity: Psychomotor Activity: Normal   Assets  Assets: Desire for Improvement; Resilience; Housing   Sleep  Sleep: Sleep: Poor Number of Hours of Sleep: 3   Nutritional Assessment (For OBS and FBC admissions only) Has the patient had a weight loss or gain of 10 pounds or more in the last 3 months?: Yes Has the patient had a decrease  in food intake/or appetite?: No Does the patient have dental problems?: No Does the patient have eating habits or behaviors that may be indicators of an eating disorder including binging or inducing vomiting?: No Has the patient recently lost weight without trying?: 0 Has the patient been eating poorly because of a decreased appetite?: 0 Malnutrition Screening Tool Score: 0    Physical Exam HENT:     Head: Normocephalic.     Nose: Nose normal.  Eyes:     Pupils: Pupils are equal, round, and reactive to light.  Cardiovascular:     Rate and Rhythm: Normal rate.  Pulmonary:     Effort: Pulmonary effort is normal.  Musculoskeletal:        General: Normal range of motion.     Cervical back: Normal range of motion.  Neurological:     General: No focal deficit present.     Mental Status: He is alert.  Psychiatric:        Mood and Affect: Mood normal.        Behavior: Behavior normal.        Thought Content: Thought content normal.        Judgment: Judgment normal.    Review of Systems  Constitutional: Negative.   HENT: Negative.    Eyes: Negative.   Respiratory: Negative.    Cardiovascular: Negative.   Gastrointestinal: Negative.   Genitourinary: Negative.   Skin: Negative.   Neurological: Negative.   Psychiatric/Behavioral:  Positive for depression and suicidal ideas. The patient is nervous/anxious.     Blood pressure 134/84, pulse 79, temperature 98.7 F (37.1 C), temperature source Oral, resp. rate 19, SpO2 100%. There is no height or weight on file to calculate BMI.  Past Psychiatric History: PTSD,  MDD.SI,  Panic attack    Is the patient at risk to self? Yes  Has the patient been a risk to self in the past 6 months? Yes .    Has the patient been a risk to self within the distant past? Yes   Is the patient a risk to others? No   Has the patient been a risk to others in the past 6 months? No   Has the patient been a risk to others within the distant past? No    Past Medical History: see chart   Family History: unknown   Social History: Cocaine   Last Labs:  No visits with results within 6 Month(s) from this visit.  Latest known visit with results is:  Admission on 02/03/2022, Discharged on 02/03/2022  Component Date Value Ref Range Status   Glucose-Capillary 02/03/2022 83  70 - 99 mg/dL Final   Glucose reference range applies only to samples taken after fasting for at least 8 hours.   Sodium 02/03/2022 134 (L)  135 - 145 mmol/L Final   Potassium 02/03/2022 5.0  3.5 - 5.1 mmol/L Final   Chloride 02/03/2022 102  98 - 111 mmol/L Final   CO2 02/03/2022 23  22 - 32 mmol/L  Final   Glucose, Bld 02/03/2022 76  70 - 99 mg/dL Final   Glucose reference range applies only to samples taken after fasting for at least 8 hours.   BUN 02/03/2022 19  6 - 20 mg/dL Final   Creatinine, Ser 02/03/2022 0.91  0.61 - 1.24 mg/dL Final   Calcium 65/78/4696 9.0  8.9 - 10.3 mg/dL Final   Total Protein 29/52/8413 7.9  6.5 - 8.1 g/dL Final   Albumin 24/40/1027 4.4  3.5 - 5.0 g/dL Final   AST 25/36/6440 39  15 - 41 U/L Final   ALT 02/03/2022 33  0 - 44 U/L Final   Alkaline Phosphatase 02/03/2022 63  38 - 126 U/L Final   Total Bilirubin 02/03/2022 1.1  0.3 - 1.2 mg/dL Final   GFR, Estimated 02/03/2022 >60  >60 mL/min Final   Comment: (NOTE) Calculated using the CKD-EPI Creatinine Equation (2021)    Anion gap 02/03/2022 9  5 - 15 Final   Performed at [facility], [address] [place], [place] [address]   WBC 02/03/2022 8.7  4.0 - 10.5 K/uL Final   RBC 02/03/2022 4.87  4.22 - 5.81 MIL/uL Final   Hemoglobin 02/03/2022 15.3  13.0 - 17.0 g/dL Final   HCT 59/56/3875 45.5  39.0 - 52.0 % Final   MCV 02/03/2022 93.4  80.0 - 100.0 fL Final   MCH 02/03/2022 31.4  26.0 - 34.0 pg Final   MCHC 02/03/2022 33.6  30.0 - 36.0 g/dL Final   RDW 64/33/2951 13.5  11.5 - 15.5 % Final   Platelets 02/03/2022 367  150 - 400 K/uL Final   nRBC 02/03/2022 0.0  0.0  - 0.2 % Final   Neutrophils Relative % 02/03/2022 42  % Final   Neutro Abs 02/03/2022 3.6  1.7 - 7.7 K/uL Final   Lymphocytes Relative 02/03/2022 36  % Final   Lymphs Abs 02/03/2022 3.2  0.7 - 4.0 K/uL Final   Monocytes Relative 02/03/2022 13  % Final   Monocytes Absolute 02/03/2022 1.2 (H)  0.1 - 1.0 K/uL Final   Eosinophils Relative 02/03/2022 7  % Final   Eosinophils Absolute 02/03/2022 0.6 (H)  0.0 - 0.5 K/uL Final   Basophils Relative 02/03/2022 1  % Final   Basophils Absolute 02/03/2022 0.1  0.0 - 0.1 K/uL Final   Immature Granulocytes 02/03/2022 1  % Final   Abs Immature Granulocytes 02/03/2022 0.05  0.00 - 0.07 K/uL Final   Performed at [facility], [address], [place], [place] [address]    Allergies: Bee venom, Penicillins, Codeine, and Peanut-containing drug products  Medications:  PTA Medications  Medication Sig   EPINEPHrine 0.3 mg/0.3 mL IJ SOAJ injection Inject 0.3 mLs (0.3 mg total) into the muscle as needed for anaphylaxis.   hydrOXYzine (ATARAX) 25 MG tablet Take 1 tablet (25 mg total) by mouth every 4 (four) hours as needed for anxiety (Sleep).   docusate sodium (COLACE) 100 MG capsule Take 1 capsule (100 mg total) by mouth daily.   loratadine (CLARITIN) 10 MG tablet Take 1 tablet (10 mg total) by mouth daily.   Multiple Vitamin (MULTIVITAMIN WITH MINERALS) TABS tablet Take 1 tablet by mouth daily.   gabapentin (NEURONTIN) 600 MG tablet Take 1,200 mg by mouth 3 (three) times daily.      Medical Decision Making  Inpatient observation     Recommendations  Based on my evaluation the patient does not appear to have an emergency medical condition.  Sindy Guadeloupe, NP 09/02/23  5:57 AM

## 2023-12-30 DIAGNOSIS — R0602 Shortness of breath: Secondary | ICD-10-CM | POA: Diagnosis not present

## 2023-12-30 DIAGNOSIS — E559 Vitamin D deficiency, unspecified: Secondary | ICD-10-CM | POA: Diagnosis not present

## 2023-12-30 DIAGNOSIS — Z79899 Other long term (current) drug therapy: Secondary | ICD-10-CM | POA: Diagnosis not present

## 2023-12-30 DIAGNOSIS — R5383 Other fatigue: Secondary | ICD-10-CM | POA: Diagnosis not present

## 2023-12-30 DIAGNOSIS — I10 Essential (primary) hypertension: Secondary | ICD-10-CM | POA: Diagnosis not present

## 2023-12-30 DIAGNOSIS — K4090 Unilateral inguinal hernia, without obstruction or gangrene, not specified as recurrent: Secondary | ICD-10-CM | POA: Diagnosis not present

## 2023-12-30 DIAGNOSIS — Z1159 Encounter for screening for other viral diseases: Secondary | ICD-10-CM | POA: Diagnosis not present

## 2023-12-30 DIAGNOSIS — E789 Disorder of lipoprotein metabolism, unspecified: Secondary | ICD-10-CM | POA: Diagnosis not present

## 2023-12-30 DIAGNOSIS — Z87891 Personal history of nicotine dependence: Secondary | ICD-10-CM | POA: Diagnosis not present

## 2023-12-30 DIAGNOSIS — R030 Elevated blood-pressure reading, without diagnosis of hypertension: Secondary | ICD-10-CM | POA: Diagnosis not present

## 2023-12-30 DIAGNOSIS — Z0000 Encounter for general adult medical examination without abnormal findings: Secondary | ICD-10-CM | POA: Diagnosis not present

## 2024-01-01 DIAGNOSIS — Z131 Encounter for screening for diabetes mellitus: Secondary | ICD-10-CM | POA: Diagnosis not present

## 2024-01-01 DIAGNOSIS — Z7289 Other problems related to lifestyle: Secondary | ICD-10-CM | POA: Diagnosis not present

## 2024-01-01 DIAGNOSIS — Z1159 Encounter for screening for other viral diseases: Secondary | ICD-10-CM | POA: Diagnosis not present

## 2024-01-01 DIAGNOSIS — E559 Vitamin D deficiency, unspecified: Secondary | ICD-10-CM | POA: Diagnosis not present

## 2024-01-01 DIAGNOSIS — R768 Other specified abnormal immunological findings in serum: Secondary | ICD-10-CM | POA: Diagnosis not present

## 2024-01-01 DIAGNOSIS — I10 Essential (primary) hypertension: Secondary | ICD-10-CM | POA: Diagnosis not present

## 2024-01-01 DIAGNOSIS — K4090 Unilateral inguinal hernia, without obstruction or gangrene, not specified as recurrent: Secondary | ICD-10-CM | POA: Diagnosis not present

## 2024-01-01 DIAGNOSIS — E789 Disorder of lipoprotein metabolism, unspecified: Secondary | ICD-10-CM | POA: Diagnosis not present

## 2024-01-01 DIAGNOSIS — Z6821 Body mass index (BMI) 21.0-21.9, adult: Secondary | ICD-10-CM | POA: Diagnosis not present

## 2024-01-09 ENCOUNTER — Emergency Department (HOSPITAL_COMMUNITY)
Admission: EM | Admit: 2024-01-09 | Discharge: 2024-01-09 | Disposition: A | Discharge status: Home / Self Care | Payer: 59 | Attending: Emergency Medicine | Admitting: Emergency Medicine

## 2024-01-09 ENCOUNTER — Emergency Department (HOSPITAL_COMMUNITY): Payer: 59

## 2024-01-09 ENCOUNTER — Encounter (HOSPITAL_COMMUNITY): Payer: Self-pay | Admitting: Emergency Medicine

## 2024-01-09 DIAGNOSIS — R0602 Shortness of breath: Secondary | ICD-10-CM | POA: Diagnosis present

## 2024-01-09 DIAGNOSIS — U071 COVID-19: Secondary | ICD-10-CM | POA: Diagnosis not present

## 2024-01-09 DIAGNOSIS — R0789 Other chest pain: Secondary | ICD-10-CM | POA: Diagnosis not present

## 2024-01-09 DIAGNOSIS — K4090 Unilateral inguinal hernia, without obstruction or gangrene, not specified as recurrent: Secondary | ICD-10-CM | POA: Insufficient documentation

## 2024-01-09 DIAGNOSIS — R059 Cough, unspecified: Secondary | ICD-10-CM | POA: Diagnosis not present

## 2024-01-09 DIAGNOSIS — I700 Atherosclerosis of aorta: Secondary | ICD-10-CM | POA: Diagnosis not present

## 2024-01-09 DIAGNOSIS — Z91010 Allergy to peanuts: Secondary | ICD-10-CM | POA: Diagnosis not present

## 2024-01-09 DIAGNOSIS — I10 Essential (primary) hypertension: Secondary | ICD-10-CM | POA: Diagnosis not present

## 2024-01-09 LAB — RESP PANEL BY RT-PCR (RSV, FLU A&B, COVID)  RVPGX2
Influenza A by PCR: NEGATIVE
Influenza B by PCR: NEGATIVE
Resp Syncytial Virus by PCR: NEGATIVE
SARS Coronavirus 2 by RT PCR: POSITIVE — AB

## 2024-01-09 LAB — CBC WITH DIFFERENTIAL/PLATELET
Abs Immature Granulocytes: 0.04 10*3/uL (ref 0.00–0.07)
Basophils Absolute: 0.1 10*3/uL (ref 0.0–0.1)
Basophils Relative: 1 %
Eosinophils Absolute: 0.2 10*3/uL (ref 0.0–0.5)
Eosinophils Relative: 3 %
HCT: 44.7 % (ref 39.0–52.0)
Hemoglobin: 15.1 g/dL (ref 13.0–17.0)
Immature Granulocytes: 0 %
Lymphocytes Relative: 17 %
Lymphs Abs: 1.5 10*3/uL (ref 0.7–4.0)
MCH: 31.1 pg (ref 26.0–34.0)
MCHC: 33.8 g/dL (ref 30.0–36.0)
MCV: 92 fL (ref 80.0–100.0)
Monocytes Absolute: 1.7 10*3/uL — ABNORMAL HIGH (ref 0.1–1.0)
Monocytes Relative: 18 %
Neutro Abs: 5.6 10*3/uL (ref 1.7–7.7)
Neutrophils Relative %: 61 %
Platelets: 280 10*3/uL (ref 150–400)
RBC: 4.86 MIL/uL (ref 4.22–5.81)
RDW: 13 % (ref 11.5–15.5)
WBC: 9.1 10*3/uL (ref 4.0–10.5)
nRBC: 0 % (ref 0.0–0.2)

## 2024-01-09 LAB — BASIC METABOLIC PANEL
Anion gap: 11 (ref 5–15)
BUN: 21 mg/dL (ref 8–23)
CO2: 22 mmol/L (ref 22–32)
Calcium: 9.3 mg/dL (ref 8.9–10.3)
Chloride: 104 mmol/L (ref 98–111)
Creatinine, Ser: 0.74 mg/dL (ref 0.61–1.24)
GFR, Estimated: >60 mL/min (ref >60)
Glucose, Bld: 86 mg/dL (ref 70–99)
Potassium: 4 mmol/L (ref 3.5–5.1)
Sodium: 137 mmol/L (ref 135–145)

## 2024-01-09 LAB — BRAIN NATRIURETIC PEPTIDE: B Natriuretic Peptide: 27.4 pg/mL (ref 0.0–100.0)

## 2024-01-09 LAB — TROPONIN I (HIGH SENSITIVITY): Troponin I (High Sensitivity): 2 ng/L (ref <18)

## 2024-01-09 MED ORDER — PAXLOVID (300/100) 20 X 150 MG & 10 X 100MG PO TBPK: 3.0000 | ORAL_TABLET | Freq: Two times a day (BID) | ORAL | 0 refills | Status: AC

## 2024-01-09 MED ORDER — KETOROLAC TROMETHAMINE 30 MG/ML IJ SOLN
30.0000 mg | Freq: Once | INTRAMUSCULAR | Status: AC
  Administered 2024-01-09: 30 mg via INTRAVENOUS
  Filled 2024-01-09: qty 1

## 2024-01-09 NOTE — Discharge Instructions (Addendum)
You were seen in the emergency department for cough and worsening pain in your inguinal hernia.  You tested positive for COVID.  We are prescribing you Paxlovid which may help limit your symptoms.  You should isolate for at least 5 days.  Your hernia is reducible and does not require surgery at this time.  Please follow-up with your surgery team as scheduled.  Tylenol and ibuprofen for pain.  Return if any worsening or concerning symptoms

## 2024-01-09 NOTE — ED Triage Notes (Signed)
Pt here from home with multiple complaints , pt states everything started with a cough and has developed some chest pain from the cough and some hernia pain which in chronic

## 2024-01-09 NOTE — ED Provider Notes (Signed)
[place] EMERGENCY DEPARTMENT AT [facility] Provider Note   CSN: [id number] Arrival date & time: 01/09/24  4098     History {Add pertinent medical, surgical, social history, OB history to HPI:1} Chief Complaint  Patient presents with   Cough   Abdominal Pain    Joseph Curtis is a 63 y.o. male.  He is here with a complaint of 3 days of nasal and chest congestion cough nonproductive headache shortness of breath.  He said he has been coughing so hard that his known right inguinal hernia hurts worse and has been out.  No nausea vomiting diarrhea.  Does endorse some difficulty in urinating.  He does not know if he has had a fever.  He has not smoked in a month and is currently at a rehab for cocaine use.  The history is provided by the patient.  Abdominal Pain Pain location: r groin. Pain quality: aching   Pain severity:  Moderate Onset quality:  Gradual Timing:  Intermittent Progression:  Unchanged Chronicity:  Recurrent Relieved by:  None tried Worsened by:  Coughing Associated symptoms: chest pain, cough and sore throat   Associated symptoms: no dysuria, no fever, no nausea and no vomiting   URI Presenting symptoms: congestion, cough, rhinorrhea and sore throat   Presenting symptoms: no fever   Cough:    Cough characteristics:  Non-productive   Sputum characteristics:  Nondescript   Severity:  Moderate   Onset quality:  Gradual   Duration:  3 days   Timing:  Intermittent   Progression:  Unchanged   Chronicity:  New      Home Medications Prior to Admission medications   Medication Sig Start Date End Date Taking? Authorizing Provider  amLODipine (NORVASC) 5 MG tablet Take 1 tablet (5 mg total) by mouth daily. 02/01/22 03/03/22  Lauro Franklin, MD  benztropine (COGENTIN) 0.5 MG tablet Take 1 tablet (0.5 mg total) by mouth 2 (two) times daily. 02/01/22 03/03/22  Lauro Franklin, MD  docusate sodium (COLACE) 100 MG capsule Take 1 capsule (100 mg  total) by mouth daily. 02/01/22   Lauro Franklin, MD  EPINEPHrine 0.3 mg/0.3 mL IJ SOAJ injection Inject 0.3 mLs (0.3 mg total) into the muscle as needed for anaphylaxis. 06/30/20   Alvira Monday, MD  gabapentin (NEURONTIN) 600 MG tablet Take 1,200 mg by mouth 3 (three) times daily. 01/25/22   [provider]  hydrOXYzine (ATARAX) 25 MG tablet Take 1 tablet (25 mg total) by mouth every 4 (four) hours as needed for anxiety (Sleep). 02/01/22   Lauro Franklin, MD  loratadine (CLARITIN) 10 MG tablet Take 1 tablet (10 mg total) by mouth daily. 02/01/22   Lauro Franklin, MD  mirtazapine (REMERON) 30 MG tablet Take 1 tablet (30 mg total) by mouth at bedtime. 02/01/22 03/03/22  Lauro Franklin, MD  Multiple Vitamin (MULTIVITAMIN WITH MINERALS) TABS tablet Take 1 tablet by mouth daily. 02/01/22   Lauro Franklin, MD  OLANZapine (ZYPREXA) 5 MG tablet Take 1 tablet (5 mg total) by mouth at bedtime. 02/01/22 03/03/22  Lauro Franklin, MD  sertraline (ZOLOFT) 50 MG tablet Take 1 tablet (50 mg total) by mouth daily. 02/01/22 03/03/22  Lauro Franklin, MD      Allergies    Bee venom, Penicillins, Codeine, and Peanut-containing drug products    Review of Systems   Review of Systems  Constitutional:  Negative for fever.  HENT:  Positive for congestion, rhinorrhea and sore throat.  Eyes:  Negative for visual disturbance.  Respiratory:  Positive for cough.   Cardiovascular:  Positive for chest pain.  Gastrointestinal:  Positive for abdominal pain. Negative for nausea and vomiting.  Genitourinary:  Positive for difficulty urinating. Negative for dysuria.    Physical Exam Updated Vital Signs BP (!) 161/92 (BP Location: Left Arm)   Pulse 85   Temp (!) 97.4 F (36.3 C) (Oral)   Resp 18   Ht 5\' 7"  (1.702 m)   Wt 62.6 kg   SpO2 100%   BMI 21.61 kg/m  Physical Exam Vitals and nursing note reviewed.  Constitutional:      General: He is not in acute  distress.    Appearance: He is well-developed.  HENT:     Head: Normocephalic and atraumatic.     Right Ear: Tympanic membrane normal.     Left Ear: Tympanic membrane normal.     Nose: Congestion and rhinorrhea present.     Mouth/Throat:     Mouth: Mucous membranes are moist.     Pharynx: Oropharynx is clear.  Eyes:     Conjunctiva/sclera: Conjunctivae normal.  Cardiovascular:     Rate and Rhythm: Normal rate and regular rhythm.     Heart sounds: No murmur heard. Pulmonary:     Effort: Pulmonary effort is normal. No respiratory distress.     Breath sounds: Normal breath sounds.  Abdominal:     Palpations: Abdomen is soft.     Tenderness: There is no abdominal tenderness. There is no guarding or rebound.     Hernia: A hernia is present. Hernia is present in the right inguinal area (reducable).  Musculoskeletal:        General: No deformity.     Cervical back: Neck supple.  Skin:    General: Skin is warm and dry.     Capillary Refill: Capillary refill takes less than 2 seconds.  Neurological:     General: No focal deficit present.     Mental Status: He is alert.     ED Results / Procedures / Treatments   Labs (all labs ordered are listed, but only abnormal results are displayed) Labs Reviewed - No data to display  EKG None  Radiology No results found.  Procedures Procedures  {Document cardiac monitor, telemetry assessment procedure when appropriate:1}  Medications Ordered in ED Medications  ketorolac (TORADOL) 30 MG/ML injection 30 mg (has no administration in time range)    ED Course/ Medical Decision Making/ A&P   {   Click here for ABCD2, HEART and other calculatorsREFRESH Note before signing :1}                              Medical Decision Making Amount and/or Complexity of Data Reviewed Labs: ordered. Radiology: ordered.  Risk Prescription drug management.   This patient complains of ***; this involves an extensive number of treatment Options  and is a complaint that carries with it a high risk of complications and morbidity. The differential includes ***  I ordered, reviewed and interpreted labs, which included *** I ordered medication *** and reviewed PMP when indicated. I ordered imaging studies which included *** and I independently    visualized and interpreted imaging which showed *** Additional history obtained from *** Previous records obtained and reviewed *** I consulted *** and discussed lab and imaging findings and discussed disposition.  Cardiac monitoring reviewed, *** Social determinants considered, *** Critical Interventions: ***  After the interventions stated above, I reevaluated the patient and found *** Admission and further testing considered, ***   {Document critical care time when appropriate:1} {Document review of labs and clinical decision tools ie heart score, Chads2Vasc2 etc:1}  {Document your independent review of radiology images, and any outside records:1} {Document your discussion with family members, caretakers, and with consultants:1} {Document social determinants of health affecting pt's care:1} {Document your decision making why or why not admission, treatments were needed:1} Final Clinical Impression(s) / ED Diagnoses Final diagnoses:  None    Rx / DC Orders ED Discharge Orders     None

## 2024-01-16 DIAGNOSIS — R768 Other specified abnormal immunological findings in serum: Secondary | ICD-10-CM | POA: Diagnosis not present

## 2024-01-16 DIAGNOSIS — Z1211 Encounter for screening for malignant neoplasm of colon: Secondary | ICD-10-CM | POA: Diagnosis not present

## 2024-01-16 DIAGNOSIS — Z7901 Long term (current) use of anticoagulants: Secondary | ICD-10-CM | POA: Diagnosis not present

## 2024-01-16 DIAGNOSIS — Z87898 Personal history of other specified conditions: Secondary | ICD-10-CM | POA: Diagnosis not present

## 2024-01-26 ENCOUNTER — Encounter (HOSPITAL_COMMUNITY): Payer: Self-pay

## 2024-01-26 ENCOUNTER — Emergency Department (HOSPITAL_COMMUNITY): Payer: 59

## 2024-01-26 ENCOUNTER — Inpatient Hospital Stay (HOSPITAL_COMMUNITY)
Admission: EM | Admit: 2024-01-26 | Discharge: 2024-01-29 | DRG: 352 | Disposition: A | Discharge status: Home / Self Care | Payer: 59 | Attending: Surgery | Admitting: Surgery

## 2024-01-26 ENCOUNTER — Other Ambulatory Visit: Payer: Self-pay

## 2024-01-26 DIAGNOSIS — Z91010 Allergy to peanuts: Secondary | ICD-10-CM

## 2024-01-26 DIAGNOSIS — K4091 Unilateral inguinal hernia, without obstruction or gangrene, recurrent: Secondary | ICD-10-CM | POA: Diagnosis not present

## 2024-01-26 DIAGNOSIS — Z87892 Personal history of anaphylaxis: Secondary | ICD-10-CM

## 2024-01-26 DIAGNOSIS — R109 Unspecified abdominal pain: Secondary | ICD-10-CM | POA: Diagnosis not present

## 2024-01-26 DIAGNOSIS — Z8616 Personal history of COVID-19: Secondary | ICD-10-CM

## 2024-01-26 DIAGNOSIS — K4090 Unilateral inguinal hernia, without obstruction or gangrene, not specified as recurrent: Secondary | ICD-10-CM | POA: Diagnosis present

## 2024-01-26 DIAGNOSIS — F419 Anxiety disorder, unspecified: Secondary | ICD-10-CM | POA: Diagnosis present

## 2024-01-26 DIAGNOSIS — F17210 Nicotine dependence, cigarettes, uncomplicated: Secondary | ICD-10-CM | POA: Diagnosis not present

## 2024-01-26 DIAGNOSIS — K4031 Unilateral inguinal hernia, with obstruction, without gangrene, recurrent: Principal | ICD-10-CM | POA: Diagnosis present

## 2024-01-26 DIAGNOSIS — Z91018 Allergy to other foods: Secondary | ICD-10-CM

## 2024-01-26 DIAGNOSIS — F4310 Post-traumatic stress disorder, unspecified: Secondary | ICD-10-CM | POA: Diagnosis present

## 2024-01-26 DIAGNOSIS — I10 Essential (primary) hypertension: Secondary | ICD-10-CM | POA: Diagnosis not present

## 2024-01-26 DIAGNOSIS — F1910 Other psychoactive substance abuse, uncomplicated: Secondary | ICD-10-CM | POA: Diagnosis present

## 2024-01-26 DIAGNOSIS — G894 Chronic pain syndrome: Secondary | ICD-10-CM | POA: Diagnosis not present

## 2024-01-26 DIAGNOSIS — K439 Ventral hernia without obstruction or gangrene: Secondary | ICD-10-CM | POA: Diagnosis present

## 2024-01-26 DIAGNOSIS — F32A Depression, unspecified: Secondary | ICD-10-CM | POA: Diagnosis present

## 2024-01-26 DIAGNOSIS — Z87898 Personal history of other specified conditions: Secondary | ICD-10-CM | POA: Diagnosis not present

## 2024-01-26 DIAGNOSIS — R112 Nausea with vomiting, unspecified: Secondary | ICD-10-CM | POA: Diagnosis not present

## 2024-01-26 DIAGNOSIS — F332 Major depressive disorder, recurrent severe without psychotic features: Secondary | ICD-10-CM | POA: Diagnosis present

## 2024-01-26 DIAGNOSIS — Z885 Allergy status to narcotic agent status: Secondary | ICD-10-CM

## 2024-01-26 DIAGNOSIS — Z880 Allergy status to penicillin: Secondary | ICD-10-CM | POA: Diagnosis not present

## 2024-01-26 DIAGNOSIS — Z91030 Bee allergy status: Secondary | ICD-10-CM

## 2024-01-26 DIAGNOSIS — K4030 Unilateral inguinal hernia, with obstruction, without gangrene, not specified as recurrent: Secondary | ICD-10-CM | POA: Diagnosis present

## 2024-01-26 LAB — CBC
HCT: 40.6 % (ref 39.0–52.0)
Hemoglobin: 13.5 g/dL (ref 13.0–17.0)
MCH: 30.6 pg (ref 26.0–34.0)
MCHC: 33.3 g/dL (ref 30.0–36.0)
MCV: 92.1 fL (ref 80.0–100.0)
Platelets: 346 10*3/uL (ref 150–400)
RBC: 4.41 MIL/uL (ref 4.22–5.81)
RDW: 12.9 % (ref 11.5–15.5)
WBC: 9.6 10*3/uL (ref 4.0–10.5)
nRBC: 0 % (ref 0.0–0.2)

## 2024-01-26 LAB — COMPREHENSIVE METABOLIC PANEL
ALT: 24 U/L (ref 0–44)
AST: 26 U/L (ref 15–41)
Albumin: 4.4 g/dL (ref 3.5–5.0)
Alkaline Phosphatase: 68 U/L (ref 38–126)
Anion gap: 10 (ref 5–15)
BUN: 19 mg/dL (ref 8–23)
CO2: 23 mmol/L (ref 22–32)
Calcium: 9.1 mg/dL (ref 8.9–10.3)
Chloride: 106 mmol/L (ref 98–111)
Creatinine, Ser: 0.86 mg/dL (ref 0.61–1.24)
GFR, Estimated: >60 mL/min (ref >60)
Glucose, Bld: 108 mg/dL — ABNORMAL HIGH (ref 70–99)
Potassium: 4.3 mmol/L (ref 3.5–5.1)
Sodium: 139 mmol/L (ref 135–145)
Total Bilirubin: 0.5 mg/dL (ref 0.0–1.2)
Total Protein: 7.4 g/dL (ref 6.5–8.1)

## 2024-01-26 LAB — LIPASE, BLOOD: Lipase: 37 U/L (ref 11–51)

## 2024-01-26 LAB — URINALYSIS, ROUTINE W REFLEX MICROSCOPIC
Bilirubin Urine: NEGATIVE
Glucose, UA: NEGATIVE mg/dL
Hgb urine dipstick: NEGATIVE
Ketones, ur: NEGATIVE mg/dL
Leukocytes,Ua: NEGATIVE
Nitrite: NEGATIVE
Protein, ur: NEGATIVE mg/dL
Specific Gravity, Urine: 1.016 (ref 1.005–1.030)
pH: 7 (ref 5.0–8.0)

## 2024-01-26 MED ORDER — HYDROMORPHONE HCL 1 MG/ML IJ SOLN
1.0000 mg | Freq: Once | INTRAMUSCULAR | Status: AC
  Administered 2024-01-26: 1 mg via INTRAVENOUS
  Filled 2024-01-26: qty 1

## 2024-01-26 MED ORDER — DIPHENHYDRAMINE HCL 12.5 MG/5ML PO ELIX: 12.5000 mg | ORAL_SOLUTION | Freq: Four times a day (QID) | ORAL | Status: DC | PRN

## 2024-01-26 MED ORDER — SODIUM CHLORIDE 0.9 % IV SOLN: Freq: Three times a day (TID) | INTRAVENOUS | Status: DC | PRN

## 2024-01-26 MED ORDER — LORAZEPAM 2 MG/ML IJ SOLN
0.5000 mg | Freq: Once | INTRAMUSCULAR | Status: AC
  Administered 2024-01-26: 0.5 mg via INTRAVENOUS
  Filled 2024-01-26: qty 1

## 2024-01-26 MED ORDER — LACTATED RINGERS IV BOLUS
1000.0000 mL | Freq: Once | INTRAVENOUS | Status: AC
  Administered 2024-01-26: 1000 mL via INTRAVENOUS

## 2024-01-26 MED ORDER — HEPARIN SODIUM (PORCINE) 5000 UNIT/ML IJ SOLN
5000.0000 [IU] | Freq: Three times a day (TID) | INTRAMUSCULAR | Status: DC
  Administered 2024-01-26 – 2024-01-29 (×5): 5000 [IU] via SUBCUTANEOUS
  Filled 2024-01-26 (×7): qty 1

## 2024-01-26 MED ORDER — LACTATED RINGERS IV BOLUS: 1000.0000 mL | Freq: Three times a day (TID) | INTRAVENOUS | Status: DC | PRN

## 2024-01-26 MED ORDER — NAPHAZOLINE-GLYCERIN 0.012-0.25 % OP SOLN: 1.0000 [drp] | Freq: Four times a day (QID) | OPHTHALMIC | Status: DC | PRN

## 2024-01-26 MED ORDER — SIMETHICONE 80 MG PO CHEW: 40.0000 mg | CHEWABLE_TABLET | Freq: Four times a day (QID) | ORAL | Status: DC | PRN

## 2024-01-26 MED ORDER — ONDANSETRON HCL 4 MG/2ML IJ SOLN
4.0000 mg | Freq: Once | INTRAMUSCULAR | Status: AC
  Administered 2024-01-26: 4 mg via INTRAVENOUS
  Filled 2024-01-26: qty 2

## 2024-01-26 MED ORDER — IOHEXOL 300 MG/ML  SOLN
100.0000 mL | Freq: Once | INTRAMUSCULAR | Status: AC | PRN
  Administered 2024-01-26: 100 mL via INTRAVENOUS

## 2024-01-26 MED ORDER — HYDROXYZINE HCL 25 MG PO TABS: 25.0000 mg | ORAL_TABLET | ORAL | Status: DC | PRN

## 2024-01-26 MED ORDER — METHOCARBAMOL 500 MG PO TABS: 500.0000 mg | ORAL_TABLET | Freq: Three times a day (TID) | ORAL | Status: DC | PRN

## 2024-01-26 MED ORDER — ACETAMINOPHEN 500 MG PO TABS
1000.0000 mg | ORAL_TABLET | Freq: Four times a day (QID) | ORAL | Status: DC
  Administered 2024-01-26 – 2024-01-29 (×8): 1000 mg via ORAL
  Filled 2024-01-26 (×10): qty 2

## 2024-01-26 MED ORDER — PHENOL 1.4 % MT LIQD: 2.0000 | OROMUCOSAL | Status: DC | PRN

## 2024-01-26 MED ORDER — KETOROLAC TROMETHAMINE 15 MG/ML IJ SOLN
15.0000 mg | Freq: Four times a day (QID) | INTRAMUSCULAR | Status: DC | PRN
  Administered 2024-01-27 (×2): 30 mg via INTRAVENOUS
  Administered 2024-01-29: 15 mg via INTRAVENOUS
  Filled 2024-01-26 (×3): qty 2

## 2024-01-26 MED ORDER — PROCHLORPERAZINE MALEATE 10 MG PO TABS: 10.0000 mg | ORAL_TABLET | Freq: Four times a day (QID) | ORAL | Status: DC | PRN

## 2024-01-26 MED ORDER — ALUM & MAG HYDROXIDE-SIMETH 200-200-20 MG/5ML PO SUSP: 30.0000 mL | Freq: Four times a day (QID) | ORAL | Status: DC | PRN

## 2024-01-26 MED ORDER — SERTRALINE HCL 50 MG PO TABS
50.0000 mg | ORAL_TABLET | Freq: Every day | ORAL | Status: DC
  Administered 2024-01-27: 50 mg via ORAL
  Filled 2024-01-26 (×3): qty 1

## 2024-01-26 MED ORDER — GABAPENTIN 100 MG PO CAPS: 200.0000 mg | ORAL_CAPSULE | Freq: Three times a day (TID) | ORAL | Status: DC

## 2024-01-26 MED ORDER — GENTAMICIN SULFATE 40 MG/ML IJ SOLN: 5.0000 mg/kg | INTRAVENOUS | Status: DC

## 2024-01-26 MED ORDER — LORATADINE 10 MG PO TABS
10.0000 mg | ORAL_TABLET | Freq: Every day | ORAL | Status: DC
  Administered 2024-01-27 – 2024-01-28 (×2): 10 mg via ORAL
  Filled 2024-01-26 (×3): qty 1

## 2024-01-26 MED ORDER — ONDANSETRON HCL 4 MG/2ML IJ SOLN
4.0000 mg | Freq: Four times a day (QID) | INTRAMUSCULAR | Status: DC | PRN
  Administered 2024-01-28 (×2): 4 mg via INTRAVENOUS
  Filled 2024-01-26 (×2): qty 2

## 2024-01-26 MED ORDER — CHLORHEXIDINE GLUCONATE CLOTH 2 % EX PADS: 6.0000 | MEDICATED_PAD | Freq: Once | CUTANEOUS | Status: DC

## 2024-01-26 MED ORDER — BISACODYL 10 MG RE SUPP: 10.0000 mg | Freq: Two times a day (BID) | RECTAL | Status: DC | PRN

## 2024-01-26 MED ORDER — KCL IN DEXTROSE-NACL 40-5-0.45 MEQ/L-%-% IV SOLN
INTRAVENOUS | Status: DC
  Filled 2024-01-26: qty 1000

## 2024-01-26 MED ORDER — SIMETHICONE 40 MG/0.6ML PO SUSP
80.0000 mg | Freq: Four times a day (QID) | ORAL | Status: DC | PRN
  Administered 2024-01-28: 80 mg via ORAL
  Filled 2024-01-26: qty 1.2

## 2024-01-26 MED ORDER — MIRTAZAPINE 15 MG PO TABS
30.0000 mg | ORAL_TABLET | Freq: Every day | ORAL | Status: DC
  Administered 2024-01-28: 30 mg via ORAL
  Filled 2024-01-26: qty 2

## 2024-01-26 MED ORDER — GABAPENTIN 300 MG PO CAPS: 300.0000 mg | ORAL_CAPSULE | ORAL | Status: DC

## 2024-01-26 MED ORDER — BUPIVACAINE LIPOSOME 1.3 % IJ SUSP: 20.0000 mL | Freq: Once | INTRAMUSCULAR | Status: DC

## 2024-01-26 MED ORDER — SALINE SPRAY 0.65 % NA SOLN: 1.0000 | Freq: Four times a day (QID) | NASAL | Status: DC | PRN

## 2024-01-26 MED ORDER — METOPROLOL TARTRATE 5 MG/5ML IV SOLN
5.0000 mg | Freq: Four times a day (QID) | INTRAVENOUS | Status: DC | PRN
  Administered 2024-01-28: 5 mg via INTRAVENOUS
  Filled 2024-01-26: qty 5

## 2024-01-26 MED ORDER — ONDANSETRON 4 MG PO TBDP: 4.0000 mg | ORAL_TABLET | Freq: Four times a day (QID) | ORAL | Status: DC | PRN

## 2024-01-26 MED ORDER — ALPRAZOLAM 0.5 MG PO TABS
1.0000 mg | ORAL_TABLET | Freq: Three times a day (TID) | ORAL | Status: DC | PRN
  Administered 2024-01-27 (×2): 1 mg via ORAL
  Filled 2024-01-26 (×2): qty 2

## 2024-01-26 MED ORDER — OLANZAPINE 5 MG PO TABS
5.0000 mg | ORAL_TABLET | Freq: Every day | ORAL | Status: DC
  Administered 2024-01-28: 5 mg via ORAL
  Filled 2024-01-26 (×2): qty 1

## 2024-01-26 MED ORDER — DIPHENHYDRAMINE HCL 50 MG/ML IJ SOLN: 12.5000 mg | Freq: Four times a day (QID) | INTRAMUSCULAR | Status: DC | PRN

## 2024-01-26 MED ORDER — METHOCARBAMOL 1000 MG/10ML IJ SOLN: 500.0000 mg | Freq: Three times a day (TID) | INTRAMUSCULAR | Status: DC | PRN

## 2024-01-26 MED ORDER — CALCIUM POLYCARBOPHIL 625 MG PO TABS
625.0000 mg | ORAL_TABLET | Freq: Two times a day (BID) | ORAL | Status: DC
  Filled 2024-01-26: qty 1

## 2024-01-26 MED ORDER — CLINDAMYCIN PHOSPHATE 900 MG/50ML IV SOLN: 900.0000 mg | INTRAVENOUS | Status: DC

## 2024-01-26 MED ORDER — MAGIC MOUTHWASH: 15.0000 mL | Freq: Four times a day (QID) | ORAL | Status: DC | PRN

## 2024-01-26 MED ORDER — ADULT MULTIVITAMIN W/MINERALS CH
1.0000 | ORAL_TABLET | Freq: Every day | ORAL | Status: DC
  Administered 2024-01-27 – 2024-01-28 (×2): 1 via ORAL
  Filled 2024-01-26 (×3): qty 1

## 2024-01-26 MED ORDER — ACETAMINOPHEN 500 MG PO TABS: 1000.0000 mg | ORAL_TABLET | ORAL | Status: DC

## 2024-01-26 MED ORDER — MENTHOL 3 MG MT LOZG: 1.0000 | LOZENGE | OROMUCOSAL | Status: DC | PRN

## 2024-01-26 MED ORDER — LORAZEPAM 2 MG/ML IJ SOLN
1.0000 mg | Freq: Once | INTRAMUSCULAR | Status: AC
  Administered 2024-01-26: 1 mg via INTRAVENOUS
  Filled 2024-01-26: qty 1

## 2024-01-26 MED ORDER — PROCHLORPERAZINE EDISYLATE 10 MG/2ML IJ SOLN: 5.0000 mg | Freq: Four times a day (QID) | INTRAMUSCULAR | Status: DC | PRN

## 2024-01-26 NOTE — ED Provider Notes (Signed)
[place] [facility] AT [facility] Provider Note   CSN: [id number] Arrival date & time: 01/26/24  1430     History  Chief Complaint  Patient presents with   Inguinal Hernia    REGGIE BISE is a 63 y.o. male.  Patient with history of hypertension and known inguinal hernia.  He states on Saturday his hernia came out in his right groin and he has not been able to reduce it.    The history is provided by the patient and medical records. No language interpreter was used.  Abdominal Pain Pain location:  Suprapubic Pain quality: aching   Pain radiates to:  Does not radiate Pain severity:  Severe Onset quality:  Sudden Timing:  Constant Progression:  Worsening Chronicity:  Recurrent Context: not diet changes   Relieved by:  Nothing Worsened by:  Nothing Associated symptoms: no chest pain, no cough, no diarrhea, no fatigue and no hematuria        Home Medications Prior to Admission medications   Medication Sig Start Date End Date Taking? Authorizing Provider  amLODipine (NORVASC) 5 MG tablet Take 1 tablet (5 mg total) by mouth daily. 02/01/22 03/03/22  Lauro Franklin, MD  benztropine (COGENTIN) 0.5 MG tablet Take 1 tablet (0.5 mg total) by mouth 2 (two) times daily. 02/01/22 03/03/22  Lauro Franklin, MD  docusate sodium (COLACE) 100 MG capsule Take 1 capsule (100 mg total) by mouth daily. 02/01/22   Lauro Franklin, MD  EPINEPHrine 0.3 mg/0.3 mL IJ SOAJ injection Inject 0.3 mLs (0.3 mg total) into the muscle as needed for anaphylaxis. 06/30/20   Alvira Monday, MD  gabapentin (NEURONTIN) 600 MG tablet Take 1,200 mg by mouth 3 (three) times daily. 01/25/22   [provider]  hydrOXYzine (ATARAX) 25 MG tablet Take 1 tablet (25 mg total) by mouth every 4 (four) hours as needed for anxiety (Sleep). 02/01/22   Lauro Franklin, MD  loratadine (CLARITIN) 10 MG tablet Take 1 tablet (10 mg total) by mouth daily. 02/01/22   Lauro Franklin, MD  mirtazapine (REMERON) 30 MG tablet Take 1 tablet (30 mg total) by mouth at bedtime. 02/01/22 03/03/22  Lauro Franklin, MD  Multiple Vitamin (MULTIVITAMIN WITH MINERALS) TABS tablet Take 1 tablet by mouth daily. 02/01/22   Lauro Franklin, MD  OLANZapine (ZYPREXA) 5 MG tablet Take 1 tablet (5 mg total) by mouth at bedtime. 02/01/22 03/03/22  Lauro Franklin, MD  sertraline (ZOLOFT) 50 MG tablet Take 1 tablet (50 mg total) by mouth daily. 02/01/22 03/03/22  Lauro Franklin, MD      Allergies    Bee venom, Penicillins, Codeine, and Peanut-containing drug products    Review of Systems   Review of Systems  Constitutional:  Negative for appetite change and fatigue.  HENT:  Negative for congestion, ear discharge and sinus pressure.   Eyes:  Negative for discharge.  Respiratory:  Negative for cough.   Cardiovascular:  Negative for chest pain.  Gastrointestinal:  Positive for abdominal pain. Negative for diarrhea.  Genitourinary:  Negative for frequency and hematuria.  Musculoskeletal:  Negative for back pain.  Skin:  Negative for rash.  Neurological:  Negative for seizures and headaches.  Psychiatric/Behavioral:  Negative for hallucinations.     Physical Exam Updated Vital Signs BP (!) 155/102   Pulse 88   Temp 97.6 F (36.4 C) (Oral)   Resp 18   Ht 5\' 7"  (1.702 m)   Wt 61.7 kg  SpO2 94%   BMI 21.30 kg/m  Physical Exam Vitals and nursing note reviewed.  Constitutional:      Appearance: He is well-developed.  HENT:     Head: Normocephalic.     Nose: Nose normal.  Eyes:     General: No scleral icterus.    Conjunctiva/sclera: Conjunctivae normal.  Neck:     Thyroid: No thyromegaly.  Cardiovascular:     Rate and Rhythm: Normal rate and regular rhythm.     Heart sounds: No murmur heard.    No friction rub. No gallop.  Pulmonary:     Breath sounds: No stridor. No wheezing or rales.  Chest:     Chest wall: No tenderness.  Abdominal:      General: There is no distension.     Tenderness: There is no abdominal tenderness. There is no rebound.     Comments: Right inguinal hernia causing significant pain  Musculoskeletal:        General: Normal range of motion.     Cervical back: Neck supple.  Lymphadenopathy:     Cervical: No cervical adenopathy.  Skin:    Findings: No erythema or rash.  Neurological:     Mental Status: He is alert and oriented to person, place, and time.     Motor: No abnormal muscle tone.     Coordination: Coordination normal.  Psychiatric:        Behavior: Behavior normal.     ED Results / Procedures / Treatments   Labs (all labs ordered are listed, but only abnormal results are displayed) Labs Reviewed  COMPREHENSIVE METABOLIC PANEL - Abnormal; Notable for the following components:      Result Value   Glucose, Bld 108 (*)    All other components within normal limits  URINALYSIS, ROUTINE W REFLEX MICROSCOPIC - Abnormal; Notable for the following components:   Color, Urine STRAW (*)    All other components within normal limits  LIPASE, BLOOD  CBC    EKG None  Radiology No results found.  Procedures Hernia reduction  Date/Time: 01/28/2024 1:27 PM  Performed by: Bethann Berkshire, MD Authorized by: Bethann Berkshire, MD  Comments: Patient had a right inguinal hernia that would not be reduced.  I gave the patient Dilaudid and Ativan and tried twice to reduce the hernia but was not successful       Medications Ordered in ED Medications  HYDROmorphone (DILAUDID) injection 1 mg (1 mg Intravenous Given 01/26/24 2022)  LORazepam (ATIVAN) injection 0.5 mg (0.5 mg Intravenous Given 01/26/24 2023)  HYDROmorphone (DILAUDID) injection 1 mg (1 mg Intravenous Given 01/26/24 2110)  ondansetron (ZOFRAN) injection 4 mg (4 mg Intravenous Given 01/26/24 2109)  LORazepam (ATIVAN) injection 1 mg (1 mg Intravenous Given 01/26/24 2111)  iohexol (OMNIPAQUE) 300 MG/ML solution 100 mL (100 mLs Intravenous Contrast  Given 01/26/24 2155)    ED Course/ Medical Decision Making/ A&P  I attempted to reduce his right inguinal hernia but was unsuccessful.  He was seen by Dr. Michaell Cowing general surgery and Dr. Michaell Cowing was able to get the hernia reduced.  He felt like the patient needed to be admitted to have the hernia repaired                               Medical Decision Making Amount and/or Complexity of Data Reviewed Labs: ordered. Radiology: ordered.  Risk Prescription drug management. Decision regarding hospitalization.  This patient presents to the ED  for concern of inguinal hernia pain, this involves an extensive number of treatment options, and is a complaint that carries with it a high risk of complications and morbidity.  The differential diagnosis includes incarcerated hernia   Co morbidities that complicate the patient evaluation  Hypertension   Additional history obtained:  Additional history obtained from patient External records from outside source obtained and reviewed including hospital records   Lab Tests:  I Ordered, and personally interpreted labs.  The pertinent results include: CBC and chemistries unremarkable   Imaging Studies ordered:  I ordered imaging studies including CT abdomen I independently visualized and interpreted imaging which showed incarcerated inguinal hernia which has fat tissue and it I agree with the radiologist interpretation   Cardiac Monitoring: / EKG:  The patient was maintained on a cardiac monitor.  I personally viewed and interpreted the cardiac monitored which showed an underlying rhythm of: Normal sinus rhythm   Consultations Obtained:  I requested consultation with the general surgery,  and discussed lab and imaging findings as well as pertinent plan - they recommend: General Surgery will admit and possibly do surgery to repair hernia   Problem List / ED Course / Critical interventions / Medication management  Hypertension and  incarcerated right inguinal hernia I ordered medication including Dilaudid and Ativan for pain Reevaluation of the patient after these medicines showed that the patient improved I have reviewed the patients home medicines and have made adjustments as needed   Social Determinants of Health:  None   Test / Admission - Considered:  None   Right inguinal hernia not reducible.  Patient will be admitted to general surgery and they will consider surgery to reduce the hernia        Final Clinical Impression(s) / ED Diagnoses Final diagnoses:  Right inguinal hernia    Rx / DC Orders ED Discharge Orders     None         Bethann Berkshire, MD 01/28/24 1329

## 2024-01-26 NOTE — ED Triage Notes (Signed)
 Hernia pain on right side for years, pt states pain has been worsening. N/V/D. Last episode an hour and a half PTA.

## 2024-01-26 NOTE — H&P (Signed)
 Joseph Curtis  06/27/1961 [id number]  CARE TEAM:  PCP: Annette Barters, MD  Outpatient Care Team: Patient Care Team: Hamrick, Orest Bio, MD as PCP - General (Family Medicine) [place] Harry, MD as Consulting Physician (General Surgery) Cobos, Tiana Flurry, MD (Psychiatry)  Inpatient Treatment Team: Treatment Team:  [place], [place], MD [place], [place], [place] Pidi, Julienne, NT Johnson, Juanique A, RN Ccs, Md, MD   This patient is a 63 y.o.male who presents today for surgical evaluation at the request of Dr Boyce Byes, [facility] ED.   Chief complaint / Reason for evaluation: Worsening right groin pain from inguinal hernia.  63 year old male.  History of depression anxiety and prior suicidal ideation.  Prior polysubstance abuse.  Had inguinal hernias rather severe requiring the need for emergency surgery repair about 25 years ago including left orchiectomy.  Open with mesh.  Had a CAT scan in 2015 that raise concerns of subtle recurrent wrangle hernia.  Became more obvious about 5 years ago.  Saw Dr. Delane Fear group and 2020.  He recommended repair of the recurrent inguinal hernia.  Looks like there were 2 attempts at scheduling that fell through.  Patient had complaints about inguinal hernia in 2022 as well.  Patient notes wife passed away and he is coming up on the 2-year anniversary.  That has been very stressful for him.  However he has a faith-based substance abuse counseling support group that has helped him stay clean.  Does not seem to reliably see primary care but has some hypertension usually controlled.  No cardiac or pulmonary issues.  Patient had episode of bad coughing and was diagnosed with COVID about 3 weeks ago.  Treated with antiviral in the ER and discharged.  His coughing has faded away.  Breathing fine.  Not severely symptomatic.  With the worsening coughing last month, however, he noticed his right inguinal hernia becoming more prominent.  Usually it is reducible but a couple  days ago it no longer was reducible.  Became more painful.  He was concerned.  Came in the ER tonight.  Dr. Bry[facility] did not feel he could reduce it.  Surgical consultation requested.  Patient also concerned of lump above bellybutton for possible recurrent hernia since he thinks he had a repair done around there as well.  Records not available.   Assessment  Joseph Curtis  63 y.o. 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  Possible small epigastric hernia as well.  Plan:  I reviewed the CAT scan looks like it is fat-containing with omentum without any evidence of phlegmon inflammation or abscess.  No obstruction.  Was fat-containing hernia.  I was able to get it reduced with some cysts and gentle massage and pressure within 20 seconds.  He felt some relief after getting it reduced.    Given the fact that he has any worsening symptomatic inguinal hernia with persistent pain and episode of incarceration and failure of getting this set up in an outpatient setting, I think it may be wise for him to get  admitted tonight for observation since he had had nausea and vomiting and make sure that he is otherwise stable and most likely attempt hernia repair in the morning.  He was no need for emergency surgery tonight.  Dr. Marny Sires will be covering the inpatient service in the morning.  He is coming on.  Given the prior open repairs patient may benefit for laparoscopic approach.  Given uptrending to rule out any major concerning the recurrent  epigastric hernias as well.  Possible standard rerepair in an open fashion with mesh.  There is no obvious cellulitis or phlegmon in the region.  He does not seem actively infected so we will hold off on antibiotics for now.  Resume home antianxiety and depression meds.  I do not know how compliant he has been with that.  Try control without narcotics since he is at risk for relapse.  Scheduled Tylenol .  Can have gabapentin .  Muscle relaxers and ketorolac .  Ice pack.  Low threshold to involve palliative care and/or medicine if he has issues.  COVID exposure last month.  He is asymptomatic now needs more than 2 weeks out, so I think it does not require any more aggressive precautions nor any severe contributions to surgery.  See when anesthesia stinks in the morning.  -VTE prophylaxis- SCDs, etc  -mobilize as tolerated to help recovery  Will see if chaplaincy can come by to offer some support as well since he wanted to reach out to his faith-based support group.  I encouraged him to do that to help.  I reviewed nursing notes, ED provider notes, last 24 h vitals and pain scores, last 48 h intake and output, last 24 h labs and trends, and last 24 h imaging results. I have reviewed this patient's available data, including medical history, events of note, test results, etc as part of my evaluation.  A significant portion of that time was spent in counseling.  Care during the described time interval was provided by me.  This care required moderate level of medical decision making.  01/26/2024  Eddye Goodie, MD, FACS, MASCRS Esophageal, Gastrointestinal & Colorectal Surgery Robotic and Minimally Invasive Surgery  [facility] [address], [address] [place], [place] [address] [phone] Fax [phone] Main  CONTACT INFORMATION: Weekday (9AM-5PM): Call [facility] main office at [phone] Weeknight (5PM-9AM) or Weekend/Holiday: Check  EPIC "Web Links" tab & use "AMION" (password " [email or web address]") for General Surgery [facility] coverage  Please, DO NOT use SecureChat  (it is not reliable communication to reach operating surgeons & will lead to a delay in care).   Epic staff messaging available for outptient concerns needing 1-2 business day response.      01/26/2024      Past Medical History:  Diagnosis Date   Anxiety    Arthritis    Back pain    chronic   Depression    Fall    from construction site   Headache    Hypertension    Legg-Calve-Perthes disease, bilateral    Neck pain    chronic   Panic attacks    Pneumonia     Past Surgical History:  Procedure Laterality Date   INGUINAL HERNIA REPAIR Bilateral 2000   JOINT REPLACEMENT     right knee, left shoulder   ORCHIECTOMY Left 2000    Social History   Socioeconomic History   Marital status: Married    Spouse name: Not on file   Number  of children: Not on file   Years of education: Not on file   Highest education level: Not on file  Occupational History   Not on file  Tobacco Use   Smoking status: Every Day    Current packs/day: 0.50    Average packs/day: 0.5 packs/day for 30.0 years (15.0 ttl pk-yrs)    Types: Cigarettes   Smokeless tobacco: Never  Vaping Use   Vaping status: Never Used  Substance and Sexual Activity   Alcohol use: Not Currently   Drug use: Not Currently    Types: Cocaine, Heroin, Marijuana    Comment: states stopped using drugs few months back.   Sexual activity: Not Currently  Other Topics Concern   Not on file  Social History Narrative   Not on file   Social Drivers of Health   Financial Resource Strain: Not on file  Food Insecurity: Not on file  Transportation Needs: Not on file  Physical Activity: Not on file  Stress: Not on file  Social Connections: Not on file  Intimate Partner Violence: Not on file    History reviewed. No pertinent family history.  Current Facility-Administered Medications  Medication  Dose Route Frequency Provider Last Rate Last Admin   0.9 %  sodium chloride  infusion   Intravenous Q8H PRN Candyce Champagne, MD       Cecily Cohen ON 01/27/2024] acetaminophen  (TYLENOL ) tablet 1,000 mg  1,000 mg Oral Q6H Candyce Champagne, MD       ALPRAZolam  (XANAX ) tablet 1 mg  1 mg Oral TID PRN Candyce Champagne, MD       alum & mag hydroxide-simeth (MAALOX/MYLANTA) 200-200-20 MG/5ML suspension 30 mL  30 mL Oral Q6H PRN Candyce Champagne, MD       bisacodyl  (DULCOLAX) suppository 10 mg  10 mg Rectal Q12H PRN Candyce Champagne, MD       dextrose  5 % and 0.45 % NaCl with KCl 40 mEq/L infusion   Intravenous Continuous Candyce Champagne, MD       heparin  injection 5,000 Units  5,000 Units Subcutaneous Q8H Kaylise Blakeley, MD       hydrOXYzine  (ATARAX ) tablet 25 mg  25 mg Oral Q4H PRN Candyce Champagne, MD       ketorolac  (TORADOL ) 15 MG/ML injection 15-30 mg  15-30 mg Intravenous Q6H PRN Candyce Champagne, MD       lactated ringers  bolus 1,000 mL  1,000 mL Intravenous Once Candyce Champagne, MD       lactated ringers  bolus 1,000 mL  1,000 mL Intravenous Q8H PRN Candyce Champagne, MD       Cecily Cohen ON 01/27/2024] loratadine  (CLARITIN ) tablet 10 mg  10 mg Oral Daily Candyce Champagne, MD       magic mouthwash  15 mL Oral QID PRN Candyce Champagne, MD       menthol -cetylpyridinium (CEPACOL) lozenge 3 mg  1 lozenge Oral PRN Candyce Champagne, MD       methocarbamol  (ROBAXIN ) tablet 500 mg  500 mg Oral Q8H PRN Candyce Champagne, MD       Or   methocarbamol  (ROBAXIN ) injection 500 mg  500 mg Intravenous Q8H PRN Candyce Champagne, MD       metoprolol  tartrate (LOPRESSOR ) injection 5 mg  5 mg Intravenous Q6H PRN Candyce Champagne, MD       mirtazapine  (REMERON ) tablet 30 mg  30 mg Oral QHS Stanislav Gervase, MD       [START ON 01/27/2024] multivitamin with minerals tablet 1 tablet  1 tablet Oral Daily Candyce Champagne, MD  naphazoline-glycerin  (CLEAR EYES REDNESS) ophth solution 1-2 drop  1-2 drop Both Eyes QID PRN Candyce Champagne, MD       OLANZapine  (ZYPREXA ) tablet 5 mg  5 mg  Oral QHS Jayme Mednick, MD       ondansetron  (ZOFRAN -ODT) disintegrating tablet 4 mg  4 mg Oral Q6H PRN Candyce Champagne, MD       Or   ondansetron  (ZOFRAN ) injection 4 mg  4 mg Intravenous Q6H PRN Candyce Champagne, MD       phenol (CHLORASEPTIC) mouth spray 2 spray  2 spray Mouth/Throat PRN Candyce Champagne, MD       polycarbophil (FIBERCON) tablet 625 mg  625 mg Oral BID Candyce Champagne, MD       prochlorperazine  (COMPAZINE ) tablet 10 mg  10 mg Oral Q6H PRN Candyce Champagne, MD       Or   prochlorperazine  (COMPAZINE ) injection 5-10 mg  5-10 mg Intravenous Q6H PRN Candyce Champagne, MD       Cecily Cohen ON 01/27/2024] sertraline  (ZOLOFT ) tablet 50 mg  50 mg Oral Daily Candyce Champagne, MD       simethicone  (MYLICON) 40 MG/0.6ML suspension 80 mg  80 mg Oral QID PRN Candyce Champagne, MD       simethicone  (MYLICON) chewable tablet 40 mg  40 mg Oral Q6H PRN Candyce Champagne, MD       sodium chloride  (OCEAN) 0.65 % nasal spray 1-2 spray  1-2 spray Each Nare Q6H PRN Candyce Champagne, MD       Current Outpatient Medications  Medication Sig Dispense Refill   ALPRAZolam  (XANAX ) 1 MG tablet Take 1 mg by mouth 4 (four) times daily as needed.     atorvastatin (LIPITOR) 20 MG tablet Take 20 mg by mouth at bedtime.     losartan  (COZAAR ) 50 MG tablet Take 50 mg by mouth daily.     Vitamin D, Ergocalciferol, (DRISDOL) 1.25 MG (50000 UNIT) CAPS capsule Take 50,000 Units by mouth once a week.     amLODipine  (NORVASC ) 5 MG tablet Take 1 tablet (5 mg total) by mouth daily. 30 tablet 0   benztropine  (COGENTIN ) 0.5 MG tablet Take 1 tablet (0.5 mg total) by mouth 2 (two) times daily. 60 tablet 0   docusate sodium  (COLACE) 100 MG capsule Take 1 capsule (100 mg total) by mouth daily. 10 capsule 0   EPINEPHrine  0.3 mg/0.3 mL IJ SOAJ injection Inject 0.3 mLs (0.3 mg total) into the muscle as needed for anaphylaxis. 2 each 2   gabapentin  (NEURONTIN ) 600 MG tablet Take 1,200 mg by mouth 3 (three) times daily.     hydrOXYzine  (ATARAX ) 25 MG tablet Take 1  tablet (25 mg total) by mouth every 4 (four) hours as needed for anxiety (Sleep). 30 tablet 0   loratadine  (CLARITIN ) 10 MG tablet Take 1 tablet (10 mg total) by mouth daily.     mirtazapine  (REMERON ) 30 MG tablet Take 1 tablet (30 mg total) by mouth at bedtime. 30 tablet 0   Multiple Vitamin (MULTIVITAMIN WITH MINERALS) TABS tablet Take 1 tablet by mouth daily.     OLANZapine  (ZYPREXA ) 5 MG tablet Take 1 tablet (5 mg total) by mouth at bedtime. 30 tablet 0   sertraline  (ZOLOFT ) 50 MG tablet Take 1 tablet (50 mg total) by mouth daily. 30 tablet 0     Allergies  Allergen Reactions   Bee Venom Anaphylaxis and Other (See Comments)    E.R. visit on 06/30/2020 Allergic to honey, also   Penicillins Anaphylaxis, Hives  and Rash    Did it involve swelling of the face/tongue/throat, SOB, or low BP? No Did it involve sudden or severe rash/hives, skin peeling, or any reaction on the inside of your mouth or nose? No Did you need to seek medical attention at a hospital or doctor's office? Yes When did it last happen? From childhood  If all above answers are "NO", may proceed with cephalosporin use.    Codeine Itching and Other (See Comments)    "My feet itch, burn--I can't stand it."   Peanut-Containing Drug Products     Pt states he is allergic to peanuts    ROS:   All other systems reviewed & are negative except per HPI or as noted below: Constitutional:  No fevers, chills, sweats.  Weight stable Eyes:  No vision changes, No discharge HENT:  No sore throats, nasal drainage Lymph: No neck swelling, No bruising easily Pulmonary:  No cough, productive sputum CV: No orthopnea, PND  Patient walks 20 minutes without difficulty.  No exertional chest/neck/shoulder/arm pain.  GI: No personal nor family history of GI/colon cancer, inflammatory bowel disease, irritable bowel syndrome, allergy such as Celiac Sprue, dietary/dairy problems, colitis, ulcers nor gastritis.  No recent sick  contacts/gastroenteritis.  No travel outside the country.  No changes in diet.  Renal: No UTIs, No hematuria Genital:  No drainage, bleeding, masses Musculoskeletal: No severe joint pain.  Good ROM major joints Skin:  No sores or lesions Heme/Lymph:  No easy bleeding.  No swollen lymph nodes   BP (!) 155/102   Pulse 88   Temp 97.6 F (36.4 C) (Oral)   Resp 18   Ht 5\' 7"  (1.702 m)   Wt 61.7 kg   SpO2 94%   BMI 21.30 kg/m   Physical Exam:  Constitutional: Not cachectic.  Hygeine adequate.  Vitals signs as above.   Eyes: Pupils reactive, normal extraocular movements. Sclera nonicteric Neuro: CN II-XII intact.  No major focal sensory defects.  No major motor deficits. Lymph: No head/neck/groin lymphadenopathy Psych:  No severe agitation.  Moderately anxious but consolable.  No hypomania.  No severe anxiety.  Judgment & insight Adequate, Oriented x4, HENT: Normocephalic, Mucus membranes moist.  No thrush.   Neck: Supple, No tracheal deviation.  No obvious thyromegaly Chest: No pain to chest wall compression.  Good respiratory excursion.  No audible wheezing CV:  Pulses intact.  regular rhythm.  No major extremity edema  Abdomen:  Flat Hernia: Not present. Diastasis recti: Not present. Soft.   Nondistended.  Subcutaneous thickening 2 x 2 cm few centimeters above the umbilicus suspicious for small fat-containing epigastric hernia.  No cellulitis or inflammation..  No hepatomegaly.  No splenomegaly  Gen:  Inguinal hernia: Obvious right groin bulging 4 x 4 cm.  With gentle massage and pressure is able to get it reduced.  Was sensitive for him but he felt some relief.  Left side without any hernia.  Left testicle absent..  Inguinal lymph nodes: without lymphadenopathy.    Rectal: (Deferred)  Ext: No obvious deformity or contracture.  Edema: Not present.  No cyanosis Skin: No major subcutaneous nodules.  Warm and dry Musculoskeletal: Severe joint rigidity not present.  No obvious  clubbing.  No digital petechiae.     Results:   Labs: Results for orders placed or performed during the hospital encounter of 01/26/24 (from the past 48 hours)  Urinalysis, Routine w reflex microscopic -Urine, Clean Catch     Status: Abnormal   Collection Time: 01/26/24  3:46 PM  Result Value Ref Range   Color, Urine STRAW (A) YELLOW   APPearance CLEAR CLEAR   Specific Gravity, Urine 1.016 1.005 - 1.030   pH 7.0 5.0 - 8.0   Glucose, UA NEGATIVE NEGATIVE mg/dL   Hgb urine dipstick NEGATIVE NEGATIVE   Bilirubin Urine NEGATIVE NEGATIVE   Ketones, ur NEGATIVE NEGATIVE mg/dL   Protein, ur NEGATIVE NEGATIVE mg/dL   Nitrite NEGATIVE NEGATIVE   Leukocytes,Ua NEGATIVE NEGATIVE    Comment: Performed at [facility], [address], [place], [place] [address]  Lipase, blood     Status: None   Collection Time: 01/26/24  3:47 PM  Result Value Ref Range   Lipase 37 11 - 51 U/L    Comment: Performed at [facility], [address] [place], [place] [address]  Comprehensive metabolic panel     Status: Abnormal   Collection Time: 01/26/24  3:47 PM  Result Value Ref Range   Sodium 139 135 - 145 mmol/L   Potassium 4.3 3.5 - 5.1 mmol/L   Chloride 106 98 - 111 mmol/L   CO2 23 22 - 32 mmol/L   Glucose, Bld 108 (H) 70 - 99 mg/dL    Comment: Glucose reference range applies only to samples taken after fasting for at least 8 hours.   BUN 19 8 - 23 mg/dL   Creatinine, Ser 9.62 0.61 - 1.24 mg/dL   Calcium  9.1 8.9 - 10.3 mg/dL   Total Protein 7.4 6.5 - 8.1 g/dL   Albumin 4.4 3.5 - 5.0 g/dL   AST 26 15 - 41 U/L   ALT 24 0 - 44 U/L   Alkaline Phosphatase 68 38 - 126 U/L   Total Bilirubin 0.5 0.0 - 1.2 mg/dL   GFR, Estimated >95 >28 mL/min    Comment: (NOTE) Calculated using the CKD-EPI Creatinine Equation (2021)    Anion gap 10 5 - 15    Comment: Performed at [facility], [address], [place], [place] [address]  CBC     Status: None    Collection Time: 01/26/24  3:47 PM  Result Value Ref Range   WBC 9.6 4.0 - 10.5 K/uL   RBC 4.41 4.22 - 5.81 MIL/uL   Hemoglobin 13.5 13.0 - 17.0 g/dL   HCT 40.1 02.7 - 25.3 %   MCV 92.1 80.0 - 100.0 fL   MCH 30.6 26.0 - 34.0 pg   MCHC 33.3 30.0 - 36.0 g/dL   RDW 66.4 40.3 - 47.4 %   Platelets 346 150 - 400 K/uL   nRBC 0.0 0.0 - 0.2 %    Comment: Performed at [facility], [address], [place], [place] [address]    Imaging / Studies: DG Chest Port 1 View Result Date: 01/09/2024 CLINICAL DATA:  cough EXAM: PORTABLE CHEST 1 VIEW COMPARISON:  Chest radiograph dated January 20, 2022. FINDINGS: The heart size and mediastinal contours are within normal limits. Aortic atherosclerosis. No focal consolidation, pleural effusion, or pneumothorax. No acute osseous abnormality. IMPRESSION: No acute cardiopulmonary findings. Electronically Signed   By: Mannie Seek M.D.   On: 01/09/2024 10:01    Medications / Allergies: per chart  Antibiotics: Anti-infectives (From admission, onward)    None         Note: Portions of this report may have been transcribed using voice recognition software. Every effort was made to ensure accuracy; however, inadvertent computerized transcription errors may be present.   Any transcriptional errors  that result from this process are unintentional.    Eddye Goodie, MD, FACS, MASCRS Esophageal, Gastrointestinal & Colorectal Surgery Robotic and Minimally Invasive Surgery  [facility] [address] [place], [place] [address] [phone] Fax [phone] Main  CONTACT INFORMATION: Weekday (9AM-5PM): Call [facility] main office at [phone] Weeknight (5PM-9AM) or Weekend/Holiday: Check EPIC "Web Links" tab & use "AMION" (password " [email or web address]") for General Surgery [facility] coverage  Please, DO NOT use SecureChat  (it is not reliable communication to reach operating surgeons &  will lead to a delay in care).   Epic staff messaging available for outptient concerns needing 1-2 business day response.       01/26/2024  11:02 PM

## 2024-01-27 ENCOUNTER — Encounter (HOSPITAL_COMMUNITY): Payer: Self-pay

## 2024-01-27 LAB — HIV ANTIBODY (ROUTINE TESTING W REFLEX): HIV Screen 4th Generation wRfx: NONREACTIVE

## 2024-01-27 MED ORDER — GENTAMICIN SULFATE 40 MG/ML IJ SOLN
5.0000 mg/kg | INTRAVENOUS | Status: DC
  Filled 2024-01-27: qty 7.75

## 2024-01-27 MED ORDER — DEXAMETHASONE SODIUM PHOSPHATE 10 MG/ML IJ SOLN
INTRAMUSCULAR | Status: AC
  Filled 2024-01-27: qty 1

## 2024-01-27 MED ORDER — GABAPENTIN 400 MG PO CAPS
900.0000 mg | ORAL_CAPSULE | Freq: Three times a day (TID) | ORAL | Status: DC
  Administered 2024-01-28 (×3): 900 mg via ORAL
  Filled 2024-01-27: qty 3
  Filled 2024-01-27 (×4): qty 1

## 2024-01-27 MED ORDER — PROPOFOL 10 MG/ML IV BOLUS
INTRAVENOUS | Status: AC
  Filled 2024-01-27: qty 20

## 2024-01-27 MED ORDER — ONDANSETRON HCL 4 MG/2ML IJ SOLN
INTRAMUSCULAR | Status: AC
  Filled 2024-01-27: qty 2

## 2024-01-27 MED ORDER — ACETAMINOPHEN 500 MG PO TABS: 1000.0000 mg | ORAL_TABLET | ORAL | Status: DC

## 2024-01-27 MED ORDER — DEXMEDETOMIDINE HCL IN NACL 80 MCG/20ML IV SOLN
INTRAVENOUS | Status: AC
  Filled 2024-01-27: qty 20

## 2024-01-27 MED ORDER — CHLORHEXIDINE GLUCONATE 0.12 % MT SOLN
15.0000 mL | Freq: Once | OROMUCOSAL | Status: AC
  Administered 2024-01-27: 15 mL via OROMUCOSAL

## 2024-01-27 MED ORDER — HYDROMORPHONE HCL 1 MG/ML IJ SOLN
0.5000 mg | INTRAMUSCULAR | Status: DC | PRN
  Administered 2024-01-27 – 2024-01-29 (×7): 1 mg via INTRAVENOUS
  Filled 2024-01-27 (×7): qty 1

## 2024-01-27 MED ORDER — MIDAZOLAM HCL 2 MG/2ML IJ SOLN
INTRAMUSCULAR | Status: AC
  Filled 2024-01-27: qty 2

## 2024-01-27 MED ORDER — LIDOCAINE HCL (PF) 2 % IJ SOLN
INTRAMUSCULAR | Status: AC
  Filled 2024-01-27: qty 5

## 2024-01-27 MED ORDER — LACTATED RINGERS IV SOLN: INTRAVENOUS | Status: AC

## 2024-01-27 MED ORDER — HYDROMORPHONE HCL 1 MG/ML IJ SOLN
1.0000 mg | Freq: Once | INTRAMUSCULAR | Status: AC
  Administered 2024-01-27: 1 mg via INTRAVENOUS

## 2024-01-27 MED ORDER — FENTANYL CITRATE (PF) 100 MCG/2ML IJ SOLN
INTRAMUSCULAR | Status: AC
  Filled 2024-01-27: qty 2

## 2024-01-27 MED ORDER — KETAMINE HCL 50 MG/5ML IJ SOSY
PREFILLED_SYRINGE | INTRAMUSCULAR | Status: AC
  Filled 2024-01-27: qty 5

## 2024-01-27 MED ORDER — CLINDAMYCIN PHOSPHATE 900 MG/50ML IV SOLN: 900.0000 mg | INTRAVENOUS | Status: DC

## 2024-01-27 MED ORDER — HYDROMORPHONE HCL 1 MG/ML IJ SOLN
INTRAMUSCULAR | Status: AC
  Filled 2024-01-27: qty 1

## 2024-01-27 MED ORDER — BUPIVACAINE LIPOSOME 1.3 % IJ SUSP: 20.0000 mL | Freq: Once | INTRAMUSCULAR | Status: DC

## 2024-01-27 MED ORDER — LACTATED RINGERS IV SOLN: INTRAVENOUS | Status: DC

## 2024-01-27 NOTE — ED Notes (Signed)
Assumed care of patient. Patient resting comfortably in bed with no signs of acute distress noted. Waiting on surgery today. Per OR scheduled around 1500 today.

## 2024-01-27 NOTE — Progress Notes (Signed)
Chaplain acknowledges consult, but was unable to see Demont due to short staffing.  Chaplain will plan to see him tomorrow.

## 2024-01-27 NOTE — Anesthesia Preprocedure Evaluation (Addendum)
Anesthesia Evaluation  Patient identified by MRN, date of birth, ID band Patient awake    Reviewed: Allergy & Precautions, NPO status , Patient's Chart, lab work & pertinent test results  Airway Mallampati: II  TM Distance: >3 FB Neck ROM: Full    Dental  (+) Missing   Pulmonary Current Smoker and Patient abstained from smoking. 15 pack year history    Pulmonary exam normal        Cardiovascular hypertension, Pt. on medications Normal cardiovascular exam     Neuro/Psych  Headaches PSYCHIATRIC DISORDERS Anxiety Depression       GI/Hepatic negative GI ROS,,,(+)     substance abuse    Endo/Other  negative endocrine ROS    Renal/GU negative Renal ROS     Musculoskeletal  (+) Arthritis , Osteoarthritis,    Abdominal   Peds  Hematology negative hematology ROS (+) Hb 13.5   Anesthesia Other Findings Laporoscopic Right Inguinal repair probable suture of Epigastric Hernia  Reproductive/Obstetrics                             Anesthesia Physical Anesthesia Plan  ASA: 2  Anesthesia Plan: General   Post-op Pain Management: Tylenol PO (pre-op)*, Precedex and Ketamine IV*   Induction: Intravenous  PONV Risk Score and Plan: 1 and Ondansetron, Dexamethasone, Midazolam and Treatment may vary due to age or medical condition  Airway Management Planned: Oral ETT  Additional Equipment: None  Intra-op Plan:   Post-operative Plan: Extubation in OR  Informed Consent: I have reviewed the patients History and Physical, chart, labs and discussed the procedure including the risks, benefits and alternatives for the proposed anesthesia with the patient or authorized representative who has indicated his/her understanding and acceptance.     Dental advisory given  Plan Discussed with: CRNA  Anesthesia Plan Comments:         Anesthesia Quick Evaluation

## 2024-01-28 ENCOUNTER — Inpatient Hospital Stay (HOSPITAL_COMMUNITY): Payer: 59 | Admitting: Anesthesiology

## 2024-01-28 ENCOUNTER — Other Ambulatory Visit: Payer: Self-pay

## 2024-01-28 ENCOUNTER — Encounter (HOSPITAL_COMMUNITY): Payer: Self-pay

## 2024-01-28 ENCOUNTER — Encounter (HOSPITAL_COMMUNITY): Admission: EM | Disposition: A | Discharge status: Home / Self Care | Payer: Self-pay | Source: Home / Self Care

## 2024-01-28 DIAGNOSIS — K439 Ventral hernia without obstruction or gangrene: Secondary | ICD-10-CM | POA: Diagnosis not present

## 2024-01-28 DIAGNOSIS — K4090 Unilateral inguinal hernia, without obstruction or gangrene, not specified as recurrent: Secondary | ICD-10-CM

## 2024-01-28 HISTORY — PX: INGUINAL HERNIA REPAIR: SHX194

## 2024-01-28 SURGERY — REPAIR, HERNIA, INGUINAL, LAPAROSCOPIC
Anesthesia: General

## 2024-01-28 MED ORDER — KETOROLAC TROMETHAMINE 30 MG/ML IJ SOLN
INTRAMUSCULAR | Status: AC
Start: 2024-01-28 — End: ?
  Filled 2024-01-28: qty 1

## 2024-01-28 MED ORDER — CLINDAMYCIN PHOSPHATE 900 MG/50ML IV SOLN
INTRAVENOUS | Status: DC | PRN
  Administered 2024-01-28: 900 mg via INTRAVENOUS

## 2024-01-28 MED ORDER — EPHEDRINE SULFATE (PRESSORS) 50 MG/ML IJ SOLN
INTRAMUSCULAR | Status: DC | PRN
  Administered 2024-01-28: 10 mg via INTRAVENOUS

## 2024-01-28 MED ORDER — CHLORHEXIDINE GLUCONATE 0.12 % MT SOLN
15.0000 mL | Freq: Once | OROMUCOSAL | Status: AC
  Administered 2024-01-28: 15 mL via OROMUCOSAL

## 2024-01-28 MED ORDER — BUPIVACAINE LIPOSOME 1.3 % IJ SUSP
INTRAMUSCULAR | Status: AC
  Filled 2024-01-28: qty 20

## 2024-01-28 MED ORDER — EPHEDRINE 5 MG/ML INJ
INTRAVENOUS | Status: AC
  Filled 2024-01-28: qty 5

## 2024-01-28 MED ORDER — OXYCODONE HCL 5 MG/5ML PO SOLN: 5.0000 mg | Freq: Once | ORAL | Status: DC | PRN

## 2024-01-28 MED ORDER — KETOROLAC TROMETHAMINE 30 MG/ML IJ SOLN
INTRAMUSCULAR | Status: DC | PRN
  Administered 2024-01-28: 30 mg via INTRAVENOUS

## 2024-01-28 MED ORDER — ROCURONIUM BROMIDE 100 MG/10ML IV SOLN
INTRAVENOUS | Status: DC | PRN
  Administered 2024-01-28: 50 mg via INTRAVENOUS

## 2024-01-28 MED ORDER — KETAMINE HCL 50 MG/5ML IJ SOSY
PREFILLED_SYRINGE | INTRAMUSCULAR | Status: AC
  Filled 2024-01-28: qty 5

## 2024-01-28 MED ORDER — GENTAMICIN SULFATE 40 MG/ML IJ SOLN
INTRAVENOUS | Status: DC | PRN
  Administered 2024-01-28: 310 mg via INTRAVENOUS

## 2024-01-28 MED ORDER — SUGAMMADEX SODIUM 200 MG/2ML IV SOLN
INTRAVENOUS | Status: DC | PRN
  Administered 2024-01-28: 200 mg via INTRAVENOUS

## 2024-01-28 MED ORDER — LACTATED RINGERS IV SOLN: INTRAVENOUS | Status: DC

## 2024-01-28 MED ORDER — FENTANYL CITRATE (PF) 100 MCG/2ML IJ SOLN
INTRAMUSCULAR | Status: AC
  Filled 2024-01-28: qty 2

## 2024-01-28 MED ORDER — MIDAZOLAM HCL 2 MG/2ML IJ SOLN
INTRAMUSCULAR | Status: AC
  Filled 2024-01-28: qty 2

## 2024-01-28 MED ORDER — FENTANYL CITRATE PF 50 MCG/ML IJ SOSY
PREFILLED_SYRINGE | INTRAMUSCULAR | Status: AC
  Filled 2024-01-28: qty 2

## 2024-01-28 MED ORDER — CLINDAMYCIN PHOSPHATE 600 MG/50ML IV SOLN
INTRAVENOUS | Status: DC | PRN
  Administered 2024-01-28: 900 mg via INTRAVENOUS

## 2024-01-28 MED ORDER — BUPIVACAINE-EPINEPHRINE 0.25% -1:200000 IJ SOLN
INTRAMUSCULAR | Status: AC
  Filled 2024-01-28: qty 1

## 2024-01-28 MED ORDER — DEXAMETHASONE SODIUM PHOSPHATE 4 MG/ML IJ SOLN
INTRAMUSCULAR | Status: DC | PRN
Start: 2024-01-28 — End: 2024-01-28
  Administered 2024-01-28: 8 mg via INTRAVENOUS

## 2024-01-28 MED ORDER — DEXAMETHASONE SODIUM PHOSPHATE 10 MG/ML IJ SOLN
INTRAMUSCULAR | Status: AC
  Filled 2024-01-28: qty 1

## 2024-01-28 MED ORDER — CLINDAMYCIN PHOSPHATE 900 MG/50ML IV SOLN
INTRAVENOUS | Status: AC
  Filled 2024-01-28: qty 50

## 2024-01-28 MED ORDER — ACETAMINOPHEN 500 MG PO TABS: 1000.0000 mg | ORAL_TABLET | Freq: Four times a day (QID) | ORAL | 2 refills | Status: DC | PRN

## 2024-01-28 MED ORDER — PROPOFOL 10 MG/ML IV BOLUS
INTRAVENOUS | Status: DC | PRN
  Administered 2024-01-28: 200 mg via INTRAVENOUS

## 2024-01-28 MED ORDER — IBUPROFEN 800 MG PO TABS: 800.0000 mg | ORAL_TABLET | Freq: Three times a day (TID) | ORAL | 0 refills | Status: AC | PRN

## 2024-01-28 MED ORDER — PROPOFOL 500 MG/50ML IV EMUL
INTRAVENOUS | Status: AC
Start: 2024-01-28 — End: ?
  Filled 2024-01-28: qty 50

## 2024-01-28 MED ORDER — LIDOCAINE HCL (PF) 2 % IJ SOLN
INTRAMUSCULAR | Status: AC
  Filled 2024-01-28: qty 5

## 2024-01-28 MED ORDER — DEXMEDETOMIDINE HCL IN NACL 80 MCG/20ML IV SOLN
INTRAVENOUS | Status: AC
  Filled 2024-01-28: qty 20

## 2024-01-28 MED ORDER — LIDOCAINE HCL (CARDIAC) PF 100 MG/5ML IV SOSY
PREFILLED_SYRINGE | INTRAVENOUS | Status: DC | PRN
  Administered 2024-01-28: 100 mg via INTRAVENOUS

## 2024-01-28 MED ORDER — GABAPENTIN 300 MG PO CAPS
ORAL_CAPSULE | ORAL | Status: AC
  Administered 2024-01-28: 300 mg via ORAL
  Filled 2024-01-28: qty 1

## 2024-01-28 MED ORDER — OXYCODONE HCL 5 MG PO TABS: 5.0000 mg | ORAL_TABLET | Freq: Once | ORAL | Status: DC | PRN

## 2024-01-28 MED ORDER — METHOCARBAMOL 750 MG PO TABS: 750.0000 mg | ORAL_TABLET | Freq: Four times a day (QID) | ORAL | 0 refills | Status: AC | PRN

## 2024-01-28 MED ORDER — ROCURONIUM BROMIDE 10 MG/ML (PF) SYRINGE
PREFILLED_SYRINGE | INTRAVENOUS | Status: AC
  Filled 2024-01-28: qty 10

## 2024-01-28 MED ORDER — ONDANSETRON HCL 4 MG/2ML IJ SOLN
INTRAMUSCULAR | Status: DC | PRN
  Administered 2024-01-28: 4 mg via INTRAVENOUS

## 2024-01-28 MED ORDER — ONDANSETRON HCL 4 MG/2ML IJ SOLN
INTRAMUSCULAR | Status: AC
  Filled 2024-01-28: qty 2

## 2024-01-28 MED ORDER — 0.9 % SODIUM CHLORIDE (POUR BTL) OPTIME
TOPICAL | Status: DC | PRN
  Administered 2024-01-28: 1000 mL

## 2024-01-28 MED ORDER — FENTANYL CITRATE PF 50 MCG/ML IJ SOSY
25.0000 ug | PREFILLED_SYRINGE | INTRAMUSCULAR | Status: DC | PRN
  Administered 2024-01-28 (×2): 25 ug via INTRAVENOUS
  Administered 2024-01-28: 50 ug via INTRAVENOUS

## 2024-01-28 MED ORDER — FENTANYL CITRATE (PF) 100 MCG/2ML IJ SOLN
INTRAMUSCULAR | Status: DC | PRN
  Administered 2024-01-28: 50 ug via INTRAVENOUS

## 2024-01-28 MED ORDER — ONDANSETRON HCL 4 MG/2ML IJ SOLN: 4.0000 mg | Freq: Four times a day (QID) | INTRAMUSCULAR | Status: DC | PRN

## 2024-01-28 MED ORDER — BUPIVACAINE-EPINEPHRINE (PF) 0.25% -1:200000 IJ SOLN
INTRAMUSCULAR | Status: DC | PRN
  Administered 2024-01-28: 50 mL

## 2024-01-28 MED ORDER — ATROPINE SULFATE 1 MG/10ML IJ SOSY
PREFILLED_SYRINGE | INTRAMUSCULAR | Status: AC
Start: 2024-01-28 — End: ?
  Filled 2024-01-28: qty 10

## 2024-01-28 MED ORDER — MIDAZOLAM HCL 5 MG/5ML IJ SOLN
INTRAMUSCULAR | Status: DC | PRN
  Administered 2024-01-28: 2 mg via INTRAVENOUS

## 2024-01-28 SURGICAL SUPPLY — 35 items
BAG COUNTER SPONGE SURGICOUNT (BAG) ×2 IMPLANT
CABLE HIGH FREQUENCY MONO STRZ (ELECTRODE) ×2 IMPLANT
CHLORAPREP W/TINT 26 (MISCELLANEOUS) ×2 IMPLANT
COVER SURGICAL LIGHT HANDLE (MISCELLANEOUS) ×2 IMPLANT
DERMABOND ADVANCED .7 DNX12 (GAUZE/BANDAGES/DRESSINGS) ×2 IMPLANT
ELECT REM PT RETURN 15FT ADLT (MISCELLANEOUS) ×2 IMPLANT
GLOVE BIO SURGEON STRL SZ7.5 (GLOVE) ×2 IMPLANT
GLOVE INDICATOR 8.0 STRL GRN (GLOVE) ×2 IMPLANT
GOWN STRL REUS W/ TWL XL LVL3 (GOWN DISPOSABLE) ×2 IMPLANT
GRASPER SUT TROCAR 14GX15 (MISCELLANEOUS) ×2 IMPLANT
IRRIG SUCT STRYKERFLOW 2 WTIP (MISCELLANEOUS) IMPLANT
IRRIGATION SUCT STRKRFLW 2 WTP (MISCELLANEOUS) IMPLANT
KIT BASIN OR (CUSTOM PROCEDURE TRAY) ×2 IMPLANT
KIT TURNOVER KIT A (KITS) IMPLANT
MARKER SKIN DUAL TIP RULER LAB (MISCELLANEOUS) ×2 IMPLANT
MESH 3DMAX 5X7 RT XLRG (Mesh General) IMPLANT
NDL INSUFFLATION 14GA 120MM (NEEDLE) ×2 IMPLANT
NEEDLE INSUFFLATION 14GA 120MM (NEEDLE) ×1 IMPLANT
RELOAD STAPLE 4.0 BLU F/HERNIA (INSTRUMENTS) ×2 IMPLANT
RELOAD STAPLE 4.8 BLK F/HERNIA (STAPLE) IMPLANT
RELOAD STAPLE HERNIA 4.0 BLUE (INSTRUMENTS) ×1 IMPLANT
RELOAD STAPLE HERNIA 4.8 BLK (STAPLE) IMPLANT
SCISSORS LAP 5X35 DISP (ENDOMECHANICALS) ×2 IMPLANT
SET TUBE SMOKE EVAC HIGH FLOW (TUBING) ×2 IMPLANT
SLEEVE Z-THREAD 5X100MM (TROCAR) ×2 IMPLANT
SPIKE FLUID TRANSFER (MISCELLANEOUS) IMPLANT
STAPLER HERNIA 12 8.5 360D (INSTRUMENTS) ×2 IMPLANT
SUT MNCRL AB 4-0 PS2 18 (SUTURE) ×2 IMPLANT
SUT NOVA NAB GS-21 0 18 T12 DT (SUTURE) IMPLANT
SUT VICRYL 0 UR6 27IN ABS (SUTURE) IMPLANT
TOWEL OR 17X26 10 PK STRL BLUE (TOWEL DISPOSABLE) ×2 IMPLANT
TRAY FOL W/BAG SLVR 16FR STRL (SET/KITS/TRAYS/PACK) IMPLANT
TRAY LAPAROSCOPIC (CUSTOM PROCEDURE TRAY) ×2 IMPLANT
TROCAR ADV FIXATION 12X100MM (TROCAR) ×2 IMPLANT
TROCAR Z-THREAD FIOS 5X100MM (TROCAR) ×2 IMPLANT

## 2024-01-28 NOTE — Transfer of Care (Signed)
Immediate Anesthesia Transfer of Care Note  Patient: Joseph Curtis  Procedure(s) Performed: LAPAROSCOPIC INGUINAL HERNIA, suture of Epigastric Hernia  Patient Location: PACU  Anesthesia Type:General  Level of Consciousness: drowsy  Airway & Oxygen Therapy: Patient Spontanous Breathing and Patient connected to face mask oxygen  Post-op Assessment: Report given to RN and Post -op Vital signs reviewed and stable  Post vital signs: Reviewed and stable  Last Vitals:  Vitals Value Taken Time  BP 175/78   Temp    Pulse 63 01/28/24 1013  Resp 15   SpO2 100 % 01/28/24 1013  Vitals shown include unfiled device data.  Last Pain:  Vitals:   01/28/24 0629  TempSrc:   PainSc: 8          Complications: No notable events documented.

## 2024-01-28 NOTE — Op Note (Signed)
Patient: SHAKEEM STERN ([birth year], [id number])  Date of Surgery: 01/28/2024  Preoperative Diagnosis:  Recurrent right inguinal hernia and Epigastric hernia (measured 0.5 x 0.5 cm on CT)  Postoperative Diagnosis:  Recurrent right inguinal hernia and Epigastric hernia (measured 0.5 x 0.5 cm on CT)  Surgical Procedure:  Laparoscopic recurrent right inguinal hernia repair with mesh Primary closure of epigastric hernia defect (measured 0.5 x 0.5 cm on CT)  Operative Team Members:  Surgeons and Role:    * Earlie Schank, Hyman Hopes, MD - Primary   Anesthesiologist: Leonides Grills, MD CRNA: Floydene Flock, CRNA   Anesthesia: General   Fluids:  Total I/O In: 750 [I.V.:750] Out: -   Complications: None  Drains:  none   Specimen: None  Disposition:  PACU - hemodynamically stable.  Plan of Care: Discharge to home after PACU  Indications for Procedure: SANTINO KINSELLA is a 63 y.o. male who presented with epigastric and recurrent right inguinal hernia.  I recommended laparoscopic right inguinal hernia repair with mesh and epigastric hernia repair.  Findings:  Technique: Transabdominal preperitoneal (TAPP) Hernia Location: Right direct inguinal hernia Mesh Size &Type:  Bard 3D max extra-large right sided mesh Mesh Fixation: Endo-Universal hernia stapler  Epigastric hernia: 0.5cm x 0.5cm closed with a novafil suture on a PMI  Infection status: Patient: [facility] Patient Case: Urgent Infection Present At Time Of Surgery (PATOS): None   Description of Procedure:  The patient was positioned supine, padded and secured to the bed, with both arms tucked.  The abdomen was widely prepped and draped.  A time out procedure was performed.  A 1 cm infraumbilical incision was made.  The abdomen was entered without trauma to the underlying viscera.  The abdomen was insufflated to 15 mm of Hg.  A 12 mm trocar was inserted at the periumbilical incision.   Additional 5 mm trocars were placed in the left and right abdomen.  There was no trauma to the underlying viscera.  The epigastric area was inspected laparoscopically and I was able to identify fat herniating into the epigastric hernia defect.  This was reduced laparoscopically by cutting into the falciform ligament, entering the preperitoneal space and reducing all fat from the hernia defect.  A PMI and small skin incision was then used to close the defect using one 0 novafil suture and one 0 vicryl suture, both in figure of 8 fashion to ensure adequate closure of the defect.  I then directed my attention to the inguinal hernia.  There was a direct on the RIGHT.  Utilizing a transabdominal pre peritoneal technique (TAPP), a horizontal incision was made in the peritoneum, immediately below the umbilicus.  Dissection was carried out in the pre peritoneal space down to the level of the hernia sac which was reduced into the peritoneal cavity completely.  The cord contents were parietalized and preserved.  A large pre peritoneal dissection was performed to uncover the direct, indirect, femoral and obturator spaces.  Cooper's ligament was uncovered medially and the psoas muscle uncovered laterally.  The mesh, as documented above, was opened and advanced into the pre peritoneal position so that it more than adequately covered the indirect, direct, femoral and obturator spaces.  The mesh laid flat, with no inferior folds and covered the entire myopectineal orifice.  The mesh was fixated with the endo-universal hernia stapler to Cooper's ligament and the posterior aspect of the rectus muscle.  The peritoneal flap was closed with the same device.  There were no peritoneal defects or exposed mesh at the conclusion.  The umbilical trocar was removed and the fascial defect was closed with a 0 Vicryl suture.  The peritoneal cavity was completely desufflated, the trocars removed and the skin closed with 4-0 Monocryl  subcuticular suture and skin glue.  All sponge and needle counts were correct at the end of the case.  Ivar Drape, MD General, Bariatric, & Minimally Invasive Surgery [facility], [place]

## 2024-01-28 NOTE — Progress Notes (Signed)
Progress Note: General Surgery Service   Chief Complaint/Subjective: In preop  Objective: Vital signs in last 24 hours: Temp:  [97.5 F (36.4 C)-98.3 F (36.8 C)] 97.7 F (36.5 C) (02/12 0604) Pulse Rate:  [61-70] 61 (02/12 0604) Resp:  [12-18] 18 (02/12 0604) BP: (107-172)/(66-87) 155/84 (02/12 0604) SpO2:  [94 %-98 %] 94 % (02/12 0604) Weight:  [61 kg] 61 kg (02/12 0636) Last BM Date : 01/26/24  Intake/Output from previous day: 02/11 0701 - 02/12 0700 In: 1275.3 [P.O.:550; I.V.:725.3] Out: 200 [Urine:200] Intake/Output this shift: No intake/output data recorded.  Constitutional: NAD; conversant; no deformities Eyes: Moist conjunctiva; no lid lag; anicteric; PERRL Neck: Trachea midline; no thyromegaly Lungs: Normal respiratory effort; no tactile fremitus CV: RRR; no palpable thrills; no pitting edema GI: Abd right inguinal and epigastric hernia; no palpable hepatosplenomegaly MSK: Normal range of motion of extremities; no clubbing/cyanosis Psychiatric: Appropriate affect; alert and oriented x3 Lymphatic: No palpable cervical or axillary lymphadenopathy  Lab Results: CBC  Recent Labs    01/26/24 1547  WBC 9.6  HGB 13.5  HCT 40.6  PLT 346   BMET Recent Labs    01/26/24 1547  NA 139  K 4.3  CL 106  CO2 23  GLUCOSE 108*  BUN 19  CREATININE 0.86  CALCIUM 9.1   PT/INR No results for input(s): "LABPROT", "INR" in the last 72 hours. ABG No results for input(s): "PHART", "HCO3" in the last 72 hours.  Invalid input(s): "PCO2", "PO2"  Anti-infectives: Anti-infectives (From admission, onward)    Start     Dose/Rate Route Frequency Ordered Stop   01/28/24 0620  clindamycin (CLEOCIN) 900 MG/50ML IVPB       Note to Pharmacy: Richardean Sale M: cabinet override      01/28/24 0620 01/28/24 1829   01/27/24 1345  clindamycin (CLEOCIN) IVPB 900 mg  Status:  Discontinued       Placed in "And" Linked Group   900 mg 100 mL/hr over 30 Minutes Intravenous On call to  O.R. 01/27/24 1251 01/27/24 2026   01/27/24 1345  gentamicin (GARAMYCIN) 310 mg in dextrose 5 % 100 mL IVPB  Status:  Discontinued       Placed in "And" Linked Group   5 mg/kg  61.7 kg 107.8 mL/hr over 60 Minutes Intravenous On call to O.R. 01/27/24 1251 01/27/24 2026   01/27/24 0600  clindamycin (CLEOCIN) IVPB 900 mg  Status:  Discontinued       Placed in "And" Linked Group   900 mg 100 mL/hr over 30 Minutes Intravenous On call to O.R. 01/26/24 2316 01/27/24 1250   01/27/24 0600  gentamicin (GARAMYCIN) 310 mg in dextrose 5 % 100 mL IVPB  Status:  Discontinued       Placed in "And" Linked Group   5 mg/kg  61.7 kg 107.8 mL/hr over 60 Minutes Intravenous On call to O.R. 01/26/24 2316 01/27/24 1250       Medications: Scheduled Meds:  [MAR Hold] acetaminophen  1,000 mg Oral Q6H   [MAR Hold] gabapentin  900 mg Oral TID   [MAR Hold] heparin  5,000 Units Subcutaneous Q8H   [MAR Hold] loratadine  10 mg Oral Daily   [MAR Hold] mirtazapine  30 mg Oral QHS   [MAR Hold] multivitamin with minerals  1 tablet Oral Daily   [MAR Hold] OLANZapine  5 mg Oral QHS   [MAR Hold] sertraline  50 mg Oral Daily   Continuous Infusions:  clindamycin     lactated ringers 10  mL/hr at 01/28/24 0700   PRN Meds:.[MAR Hold] ALPRAZolam, [MAR Hold] alum & mag hydroxide-simeth, [MAR Hold] bisacodyl, clindamycin, [MAR Hold]  HYDROmorphone (DILAUDID) injection, [MAR Hold] hydrOXYzine, [MAR Hold] ketorolac, [MAR Hold] magic mouthwash, [MAR Hold] menthol-cetylpyridinium, [MAR Hold] methocarbamol **OR** [MAR Hold] methocarbamol (ROBAXIN) injection, [MAR Hold] metoprolol tartrate, [MAR Hold] naphazoline-glycerin, [MAR Hold] ondansetron **OR** [MAR Hold] ondansetron (ZOFRAN) IV, [MAR Hold] phenol, [MAR Hold] prochlorperazine **OR** [MAR Hold] prochlorperazine, [MAR Hold] simethicone, [MAR Hold] sodium chloride  Assessment/Plan: Patient has right inguinal hernia and epigastric hernia.  I recommend laparoscopic right,  possible bilateral inguinal hernia repair with mesh and open epigastric hernia repair.  We discussed the procedure, its risks, benefits and alternatives and the patient granted consent to proceed.   LOS: 2 days   Quentin Ore, MD  [facility] Use [email or web address] to contact on call provider

## 2024-01-28 NOTE — Discharge Instructions (Signed)

## 2024-01-28 NOTE — Anesthesia Procedure Notes (Signed)
Procedure Name: Intubation Date/Time: 01/28/2024 9:10 AM  Performed by: Floydene Flock, CRNAPre-anesthesia Checklist: Patient identified, Emergency Drugs available, Suction available and Patient being monitored Patient Re-evaluated:Patient Re-evaluated prior to induction Oxygen Delivery Method: Circle system utilized Preoxygenation: Pre-oxygenation with 100% oxygen Induction Type: IV induction Ventilation: Mask ventilation without difficulty Laryngoscope Size: Mac and 3 Grade View: Grade III Tube type: Oral Tube size: 7.5 mm Number of attempts: 1 Airway Equipment and Method: Stylet Placement Confirmation: ETT inserted through vocal cords under direct vision, positive ETCO2 and breath sounds checked- equal and bilateral Secured at: 23 cm Tube secured with: Tape Dental Injury: Teeth and Oropharynx as per pre-operative assessment

## 2024-01-29 ENCOUNTER — Encounter (HOSPITAL_COMMUNITY): Payer: Self-pay | Admitting: Surgery

## 2024-01-29 MED ORDER — HYDROXYZINE HCL 25 MG PO TABS: 25.0000 mg | ORAL_TABLET | Freq: Four times a day (QID) | ORAL | Status: DC | PRN

## 2024-01-29 MED ORDER — OXYCODONE-ACETAMINOPHEN 5-325 MG PO TABS: 1.0000 | ORAL_TABLET | ORAL | 0 refills | Status: DC | PRN

## 2024-01-29 MED ORDER — LOSARTAN POTASSIUM 50 MG PO TABS
50.0000 mg | ORAL_TABLET | Freq: Every day | ORAL | Status: DC
  Administered 2024-01-29: 50 mg via ORAL
  Filled 2024-01-29: qty 1

## 2024-01-29 MED ORDER — ADULT MULTIVITAMIN W/MINERALS CH
1.0000 | ORAL_TABLET | Freq: Every day | ORAL | Status: DC
  Administered 2024-01-29: 1 via ORAL
  Filled 2024-01-29: qty 1

## 2024-01-29 MED ORDER — ONDANSETRON 4 MG PO TBDP: 4.0000 mg | ORAL_TABLET | Freq: Three times a day (TID) | ORAL | Status: DC | PRN

## 2024-01-29 MED ORDER — OXYCODONE HCL 5 MG PO TABS
5.0000 mg | ORAL_TABLET | ORAL | Status: DC | PRN
  Administered 2024-01-29: 10 mg via ORAL
  Filled 2024-01-29: qty 2

## 2024-01-29 MED ORDER — HYDROMORPHONE HCL 1 MG/ML IJ SOLN
0.5000 mg | INTRAMUSCULAR | Status: DC | PRN
Start: 2024-01-29 — End: 2024-01-29
  Administered 2024-01-29: 1 mg via INTRAVENOUS
  Filled 2024-01-29: qty 1

## 2024-01-29 MED ORDER — GABAPENTIN 400 MG PO CAPS: 600.0000 mg | ORAL_CAPSULE | Freq: Three times a day (TID) | ORAL | Status: DC

## 2024-01-29 MED ORDER — SERTRALINE HCL 50 MG PO TABS
50.0000 mg | ORAL_TABLET | Freq: Every day | ORAL | Status: DC
  Administered 2024-01-29: 50 mg via ORAL

## 2024-01-29 MED ORDER — GABAPENTIN 400 MG PO CAPS
600.0000 mg | ORAL_CAPSULE | Freq: Three times a day (TID) | ORAL | Status: DC
  Administered 2024-01-29: 600 mg via ORAL

## 2024-01-29 MED ORDER — POLYETHYLENE GLYCOL 3350 17 G PO PACK
17.0000 g | PACK | Freq: Two times a day (BID) | ORAL | Status: DC
  Administered 2024-01-29: 17 g via ORAL
  Filled 2024-01-29: qty 1

## 2024-01-29 MED ORDER — OXYCODONE-ACETAMINOPHEN 5-325 MG PO TABS: 1.0000 | ORAL_TABLET | ORAL | 0 refills | Status: AC | PRN

## 2024-01-29 MED ORDER — GABAPENTIN 300 MG PO CAPS
600.0000 mg | ORAL_CAPSULE | Freq: Three times a day (TID) | ORAL | Status: DC
Start: 2024-01-29 — End: 2024-01-29
  Filled 2024-01-29 (×3): qty 2

## 2024-01-29 NOTE — Care Management Important Message (Signed)
Important Message  Patient Details IM Letter given to Patient. Name: Joseph Curtis MRN: [id number] Date of Birth: Jul 30, 1961   Important Message Given:  Yes - Medicare IM     [name] 01/29/2024, 10:10 AM

## 2024-01-29 NOTE — Progress Notes (Signed)
Reviewed d/c instructions with pt. All questions answered. Pt awaiting transport.

## 2024-01-29 NOTE — Discharge Summary (Signed)
Patient ID: Joseph Curtis [id number] 63 y.o. [birth year]  01/26/2024  Discharge date and time: 01/29/2024  Admitting Physician: Hyman Hopes Jin Shockley  Discharge Physician: Hyman Hopes Windel Keziah  Admission Diagnoses: Right inguinal hernia [K40.90] Incarcerated right inguinal hernia [K40.30] Patient Active Problem List   Diagnosis Date Noted   History of COVID-19 01/26/2024   Incarcerated right inguinal hernia 01/26/2024   MDD (major depressive disorder), recurrent episode, severe (HCC) 01/22/2022   Severe recurrent major depressive disorder with psychotic features (HCC) 01/22/2022   Polysubstance abuse (HCC) 03/21/2020   Substance induced mood disorder (HCC) 03/21/2020   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 01/25/2019   Anxiety 06/16/2015   Posttraumatic stress disorder 06/16/2015   Tardive dyskinesia 06/16/2015   Chronic pain syndrome 11/30/2014   Legg-Calve-Perthes disease 11/30/2014   Panic disorder 11/30/2014     Discharge Diagnoses:  Patient Active Problem List   Diagnosis Date Noted   History of COVID-19 01/26/2024   Incarcerated right inguinal hernia 01/26/2024   MDD (major depressive disorder), recurrent episode, severe (HCC) 01/22/2022   Severe recurrent major depressive disorder with psychotic features (HCC) 01/22/2022   Polysubstance abuse (HCC) 03/21/2020   Substance induced mood disorder (HCC) 03/21/2020   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 01/25/2019   Anxiety 06/16/2015   Posttraumatic stress disorder 06/16/2015   Tardive dyskinesia 06/16/2015   Chronic pain syndrome 11/30/2014   Legg-Calve-Perthes disease 11/30/2014   Panic disorder 11/30/2014    Operations: Procedure(s): LAPAROSCOPIC INGUINAL HERNIA, suture of Epigastric Hernia  Admission Condition: good  Discharged Condition: good  Indication for Admission: Hernias  Hospital Course: Laparoscopic recurrent right inguinal hernia repair with  mesh and epigastric hernia repair  Consults: None  Significant Diagnostic Studies: CT  Treatments: surgery: as above  Disposition: Home  Patient Instructions:  Allergies as of 01/29/2024       Reactions   Bee Venom Anaphylaxis, Other (See Comments)   E.R. visit on 06/30/2020 Allergic to honey, also   Penicillins Anaphylaxis, Hives, Rash   Did it involve swelling of the face/tongue/throat, SOB, or low BP? No Did it involve sudden or severe rash/hives, skin peeling, or any reaction on the inside of your mouth or nose? No Did you need to seek medical attention at a hospital or doctor's office? Yes When did it last happen? From childhood  If all above answers are "NO", may proceed with cephalosporin use.   Codeine Itching, Other (See Comments)   "My feet itch, burn--I can't stand it."   Peanut-containing Drug Products    Pt states he is allergic to peanuts        Medication List     TAKE these medications    acetaminophen 500 MG tablet Commonly known as: TYLENOL Take 2 tablets (1,000 mg total) by mouth every 6 (six) hours as needed.   ALPRAZolam 1 MG tablet Commonly known as: XANAX Take 1 mg by mouth 4 (four) times daily as needed.   amLODipine 5 MG tablet Commonly known as: NORVASC Take 1 tablet (5 mg total) by mouth daily.   atorvastatin 20 MG tablet Commonly known as: LIPITOR Take 20 mg by mouth at bedtime.   benztropine 0.5 MG tablet Commonly known as: COGENTIN Take 1 tablet (0.5 mg total) by mouth 2 (two) times daily.   docusate sodium 100 MG capsule Commonly known as: COLACE Take 1 capsule (100 mg total) by mouth daily.   EPINEPHrine 0.3 mg/0.3 mL Soaj injection Commonly known as: EPI-PEN Inject 0.3 mLs (0.3 mg  total) into the muscle as needed for anaphylaxis.   gabapentin 600 MG tablet Commonly known as: NEURONTIN Take 1,200 mg by mouth 3 (three) times daily.   hydrOXYzine 25 MG tablet Commonly known as: ATARAX Take 1 tablet (25 mg total) by  mouth every 4 (four) hours as needed for anxiety (Sleep).   ibuprofen 800 MG tablet Commonly known as: ADVIL Take 1 tablet (800 mg total) by mouth every 8 (eight) hours as needed.   loratadine 10 MG tablet Commonly known as: CLARITIN Take 1 tablet (10 mg total) by mouth daily.   losartan 50 MG tablet Commonly known as: COZAAR Take 50 mg by mouth daily.   methocarbamol 750 MG tablet Commonly known as: Robaxin-750 Take 1 tablet (750 mg total) by mouth every 6 (six) hours as needed for muscle spasms.   mirtazapine 30 MG tablet Commonly known as: REMERON Take 1 tablet (30 mg total) by mouth at bedtime.   multivitamin with minerals Tabs tablet Take 1 tablet by mouth daily.   OLANZapine 5 MG tablet Commonly known as: ZYPREXA Take 1 tablet (5 mg total) by mouth at bedtime.   oxyCODONE-acetaminophen 5-325 MG tablet Commonly known as: Percocet Take 1 tablet by mouth every 4 (four) hours as needed for severe pain (pain score 7-10).   Sutab [phone] MG Tabs Generic drug: Sodium Sulfate-Mag Sulfate-KCl Take 1 tablet by mouth daily.   Vitamin D (Ergocalciferol) 1.25 MG (50000 UNIT) Caps capsule Commonly known as: DRISDOL Take 50,000 Units by mouth once a week.        Activity: no heavy lifting for 4 weeks Diet: regular diet Wound Care: keep wound clean and dry  Follow-up:  With Dr. Dossie Der in 4 weeks.  Signed: Hyman Hopes Arantxa Piercey General, Bariatric, & Minimally Invasive Surgery [facility], [place]   01/29/2024, 2:12 PM

## 2024-01-29 NOTE — Anesthesia Postprocedure Evaluation (Signed)
Anesthesia Post Note  Patient: Joseph Curtis  Procedure(s) Performed: LAPAROSCOPIC INGUINAL HERNIA, suture of Epigastric Hernia     Patient location during evaluation: PACU Anesthesia Type: General Level of consciousness: awake and alert Pain management: pain level controlled Vital Signs Assessment: post-procedure vital signs reviewed and stable Respiratory status: spontaneous breathing, nonlabored ventilation, respiratory function stable and patient connected to nasal cannula oxygen Cardiovascular status: blood pressure returned to baseline and stable Postop Assessment: no apparent nausea or vomiting Anesthetic complications: no   No notable events documented.  Last Vitals:  Vitals:   01/29/24 0526 01/29/24 0927  BP: (!) 158/83 (!) 161/77  Pulse: 73 72  Resp: 16 18  Temp: 36.9 C 36.7 C  SpO2: 93% 100%    Last Pain:  Vitals:   01/29/24 1148  TempSrc:   PainSc: 9                  Viana Sleep S

## 2024-01-29 NOTE — Progress Notes (Signed)
RN reported patient requested Percocet to be sent to a different pharmacy.  Original rx was sent to [facility] [place]. [facility] was [facility].  New rx sent to [facility] on [address] as requested.   Jacinto Halim, PA-C

## 2024-05-07 ENCOUNTER — Emergency Department (HOSPITAL_COMMUNITY)
Admission: EM | Admit: 2024-05-07 | Discharge: 2024-05-07 | Disposition: A | Discharge status: Home / Self Care | Attending: Emergency Medicine | Admitting: Emergency Medicine

## 2024-05-07 ENCOUNTER — Other Ambulatory Visit: Payer: Self-pay

## 2024-05-07 ENCOUNTER — Emergency Department (HOSPITAL_COMMUNITY)

## 2024-05-07 ENCOUNTER — Encounter (HOSPITAL_COMMUNITY): Payer: Self-pay

## 2024-05-07 DIAGNOSIS — F17200 Nicotine dependence, unspecified, uncomplicated: Secondary | ICD-10-CM | POA: Insufficient documentation

## 2024-05-07 DIAGNOSIS — R110 Nausea: Secondary | ICD-10-CM | POA: Diagnosis not present

## 2024-05-07 DIAGNOSIS — R5381 Other malaise: Secondary | ICD-10-CM | POA: Insufficient documentation

## 2024-05-07 DIAGNOSIS — Z91010 Allergy to peanuts: Secondary | ICD-10-CM | POA: Diagnosis not present

## 2024-05-07 DIAGNOSIS — R053 Chronic cough: Secondary | ICD-10-CM | POA: Diagnosis present

## 2024-05-07 DIAGNOSIS — J069 Acute upper respiratory infection, unspecified: Secondary | ICD-10-CM

## 2024-05-07 LAB — RESP PANEL BY RT-PCR (RSV, FLU A&B, COVID)  RVPGX2
Influenza A by PCR: NEGATIVE
Influenza B by PCR: NEGATIVE
Resp Syncytial Virus by PCR: NEGATIVE
SARS Coronavirus 2 by RT PCR: NEGATIVE

## 2024-05-07 MED ORDER — BENZONATATE 100 MG PO CAPS: 100.0000 mg | ORAL_CAPSULE | Freq: Three times a day (TID) | ORAL | 0 refills | Status: AC

## 2024-05-07 NOTE — ED Provider Notes (Signed)
 [place] EMERGENCY DEPARTMENT AT [facility] Provider Note   CSN: [id number] Arrival date & time: 05/07/24  0818     History  Chief Complaint  Patient presents with   Cough    MICKY SHELLER is a 63 y.o. male who presents for 5-day history of persistent cough that has been worsening over the past 5 days as well.  He describes the cough as nonproductive, denies any hemoptysis, and has associated complaints of general malaise and mild intermittent nausea.  Nausea is associated with his cough.  He does endorses current smoking history.   Cough Associated symptoms: no shortness of breath and no wheezing        Home Medications Prior to Admission medications   Medication Sig Start Date End Date Taking? Authorizing Provider  ALPRAZolam  (XANAX ) 1 MG tablet Take 1 mg by mouth 4 (four) times daily as needed. 01/23/24   [provider]  amLODipine  (NORVASC ) 5 MG tablet Take 1 tablet (5 mg total) by mouth daily. Patient not taking: Reported on 01/27/2024 02/01/22 03/03/22  Basilia Bosworth, DO  atorvastatin (LIPITOR) 20 MG tablet Take 20 mg by mouth at bedtime. 01/16/24   [provider]  benztropine  (COGENTIN ) 0.5 MG tablet Take 1 tablet (0.5 mg total) by mouth 2 (two) times daily. Patient not taking: Reported on 01/27/2024 02/01/22 03/03/22  Basilia Bosworth, DO  docusate sodium  (COLACE) 100 MG capsule Take 1 capsule (100 mg total) by mouth daily. Patient not taking: Reported on 01/27/2024 02/01/22   Pashayan, Alexander S, DO  EPINEPHrine  0.3 mg/0.3 mL IJ SOAJ injection Inject 0.3 mLs (0.3 mg total) into the muscle as needed for anaphylaxis. Patient not taking: Reported on 01/27/2024 06/30/20   Scarlette Currier, MD  gabapentin  (NEURONTIN ) 600 MG tablet Take 1,200 mg by mouth 3 (three) times daily. Patient not taking: Reported on 01/27/2024 01/25/22   [provider]  hydrOXYzine  (ATARAX ) 25 MG tablet Take 1 tablet (25 mg total) by mouth every 4 (four)  hours as needed for anxiety (Sleep). Patient not taking: Reported on 01/27/2024 02/01/22   Basilia Bosworth, DO  ibuprofen  (ADVIL ) 800 MG tablet Take 1 tablet (800 mg total) by mouth every 8 (eight) hours as needed. 01/28/24   Stechschulte, Avon Boers, MD  loratadine  (CLARITIN ) 10 MG tablet Take 1 tablet (10 mg total) by mouth daily. Patient not taking: Reported on 01/27/2024 02/01/22   Basilia Bosworth, DO  losartan  (COZAAR ) 50 MG tablet Take 50 mg by mouth daily. 01/01/24   [provider]  methocarbamol  (ROBAXIN -750) 750 MG tablet Take 1 tablet (750 mg total) by mouth every 6 (six) hours as needed for muscle spasms. 01/28/24   Stechschulte, Avon Boers, MD  mirtazapine  (REMERON ) 30 MG tablet Take 1 tablet (30 mg total) by mouth at bedtime. Patient not taking: Reported on 01/27/2024 02/01/22 03/03/22  Pashayan, Alexander S, DO  Multiple Vitamin (MULTIVITAMIN WITH MINERALS) TABS tablet Take 1 tablet by mouth daily. Patient not taking: Reported on 01/27/2024 02/01/22   Basilia Bosworth, DO  OLANZapine  (ZYPREXA ) 5 MG tablet Take 1 tablet (5 mg total) by mouth at bedtime. Patient not taking: Reported on 01/27/2024 02/01/22 03/03/22  Basilia Bosworth, DO  oxyCODONE -acetaminophen  (PERCOCET) 5-325 MG tablet Take 1 tablet by mouth every 4 (four) hours as needed for severe pain (pain score 7-10). 01/29/24   Maczis, Michael M, PA-C  SUTAB 1479-225-188 MG TABS Take 1 tablet by mouth daily. 01/19/24   [provider]  Vitamin D, Ergocalciferol, (DRISDOL) 1.25 MG (50000 UNIT) CAPS capsule Take 50,000 Units by mouth once a week. 01/01/24   [provider]      Allergies    Bee venom, Penicillins, Codeine, and Peanut-containing drug products    Review of Systems   Review of Systems  Constitutional:  Positive for appetite change and fatigue.       Decreased appetite general malaise  Respiratory:  Positive for cough. Negative for shortness of breath and wheezing.   All other systems  reviewed and are negative.   Physical Exam Updated Vital Signs BP (!) 158/95 (BP Location: Left Arm)   Pulse 95   Temp 98 F (36.7 C) (Oral)   Resp 18   Ht 5\' 7"  (1.702 m)   Wt 63.5 kg   SpO2 98%   BMI 21.93 kg/m  Physical Exam Vitals and nursing note reviewed.  Constitutional:      General: He is not in acute distress.    Appearance: Normal appearance.  HENT:     Head: Normocephalic and atraumatic.     Mouth/Throat:     Mouth: Mucous membranes are moist.     Pharynx: Oropharynx is clear.     Tonsils: No tonsillar exudate or tonsillar abscesses.  Eyes:     Extraocular Movements: Extraocular movements intact.     Conjunctiva/sclera: Conjunctivae normal.     Pupils: Pupils are equal, round, and reactive to light.  Cardiovascular:     Rate and Rhythm: Normal rate and regular rhythm.     Pulses: Normal pulses.     Heart sounds: Normal heart sounds. No murmur heard.    No friction rub. No gallop.  Pulmonary:     Effort: Pulmonary effort is normal.     Breath sounds: Normal breath sounds.  Abdominal:     General: Abdomen is flat. Bowel sounds are normal.     Palpations: Abdomen is soft.  Musculoskeletal:        General: Normal range of motion.     Cervical back: Normal range of motion and neck supple.     Right lower leg: No edema.     Left lower leg: No edema.  Lymphadenopathy:     Cervical: No cervical adenopathy.  Skin:    General: Skin is warm and dry.     Capillary Refill: Capillary refill takes less than 2 seconds.  Neurological:     General: No focal deficit present.     Mental Status: He is alert. Mental status is at baseline.  Psychiatric:        Mood and Affect: Mood normal.     ED Results / Procedures / Treatments   Labs (all labs ordered are listed, but only abnormal results are displayed) Labs Reviewed  RESP PANEL BY RT-PCR (RSV, FLU A&B, COVID)  [id number]    EKG None  Radiology DG Chest 1 View Result Date: 05/07/2024 CLINICAL DATA:   Persistent cough EXAM: CHEST  1 VIEW COMPARISON:  01/09/2024 FINDINGS: Similar mild hyperinflation. Normal heart size and vascularity. No focal pneumonia, collapse or consolidation. Negative for edema, effusion, or pneumothorax. Trachea midline. Aorta atherosclerotic. Degenerative changes and slight thoracic dextrocurvature again noted. IMPRESSION: Hyperinflation. No acute process by portable radiography. Electronically Signed   By: Melven Stable.  Shick M.D.   On: 05/07/2024 11:38    Procedures Procedures    Medications Ordered in ED Medications - No data to display  ED Course/ Medical Decision Making/ A&P  Medical Decision Making Amount and/or Complexity of Data Reviewed Radiology: ordered.   Medical Decision Making:   KEE DRUDGE is a 64 y.o. male who presented to the ED today with 5-day history of cough detailed above.     Complete initial physical exam performed, notably the patient  was alert and oriented in no apparent distress.  Lung sounds are clear and equal in all fields without any adventitious sounds.  He has good air entry into all lung fields.  Posterior oropharynx is unremarkable.  He does not have any cervical lymphadenopathy noted.  Reviewed and confirmed nursing documentation for past medical history, family history, social history.    Initial Assessment:   With the patient's presentation of cough, most likely diagnosis is viral URI.   Initial Plan:  Nasopharyngeal swabs to assess for COVID/flu/RSV.  CXR to evaluate for structural/infectious intrathoracic pathology.  Objective evaluation as below reviewed   Initial Study Results:   Laboratory  All laboratory results reviewed without evidence of clinically relevant pathology.   Exceptions include: None  Radiology:  All images reviewed independently. Agree with radiology report at this time.   DG Chest 1 View Result Date: 05/07/2024 CLINICAL DATA:  Persistent cough EXAM: CHEST  1 VIEW  COMPARISON:  01/09/2024 FINDINGS: Similar mild hyperinflation. Normal heart size and vascularity. No focal pneumonia, collapse or consolidation. Negative for edema, effusion, or pneumothorax. Trachea midline. Aorta atherosclerotic. Degenerative changes and slight thoracic dextrocurvature again noted. IMPRESSION: Hyperinflation. No acute process by portable radiography. Electronically Signed   By: Melven Stable.  Shick M.D.   On: 05/07/2024 11:38       Reassessment and Plan:   With physical exam findings along with imaging results did not think this patient has acute pneumonia, and has no other concerning findings that would be warranted admission at this time.  As such patient will follow-up outpatient with his primary care for further concerns and use over-the-counter Mucinex  along with prescription for on to manage cough as needed.  Plan was explained to patient he understands and agrees and has no further concerns at this time.          Final Clinical Impression(s) / ED Diagnoses Final diagnoses:  None    Rx / DC Orders ED Discharge Orders     None         Juanetta Nordmann, PA 05/07/24 1200    Deatra Face, MD 05/08/24 1309

## 2024-05-07 NOTE — ED Triage Notes (Signed)
 Patient presented to ER with cough x5 days. Patient denies fever, denies being around anyone sick.
